# Patient Record
Sex: Female | Born: 1935 | Race: White | Hispanic: No | Marital: Married | Smoking: Never smoker
Health system: Southern US, Community
[De-identification: ages and names within clinical notes are randomized; demographics above are authoritative.]

## PROBLEM LIST (undated history)

## (undated) DIAGNOSIS — I10 Essential (primary) hypertension: Secondary | ICD-10-CM

## (undated) DIAGNOSIS — C801 Malignant (primary) neoplasm, unspecified: Secondary | ICD-10-CM

## (undated) DIAGNOSIS — M1990 Unspecified osteoarthritis, unspecified site: Secondary | ICD-10-CM

## (undated) DIAGNOSIS — C189 Malignant neoplasm of colon, unspecified: Secondary | ICD-10-CM

## (undated) HISTORY — PX: COLOSTOMY: SHX63

## (undated) HISTORY — PX: OTHER SURGICAL HISTORY: SHX169

## (undated) HISTORY — PX: CHOLECYSTECTOMY: SHX55

---

## 2005-01-31 ENCOUNTER — Ambulatory Visit: Payer: Self-pay | Admitting: Internal Medicine

## 2005-03-05 ENCOUNTER — Emergency Department: Payer: Self-pay | Admitting: Emergency Medicine

## 2005-03-18 ENCOUNTER — Ambulatory Visit: Payer: Self-pay | Admitting: Internal Medicine

## 2005-03-22 ENCOUNTER — Ambulatory Visit: Payer: Self-pay | Admitting: Internal Medicine

## 2006-02-17 ENCOUNTER — Ambulatory Visit: Payer: Self-pay | Admitting: Unknown Physician Specialty

## 2006-03-05 ENCOUNTER — Ambulatory Visit: Payer: Self-pay | Admitting: Internal Medicine

## 2007-04-08 ENCOUNTER — Ambulatory Visit: Payer: Self-pay | Admitting: Internal Medicine

## 2007-05-09 ENCOUNTER — Other Ambulatory Visit: Payer: Self-pay

## 2007-05-10 ENCOUNTER — Inpatient Hospital Stay: Payer: Self-pay | Admitting: Internal Medicine

## 2008-07-05 ENCOUNTER — Ambulatory Visit: Payer: Self-pay | Admitting: Internal Medicine

## 2009-05-10 ENCOUNTER — Ambulatory Visit: Payer: Self-pay | Admitting: Unknown Physician Specialty

## 2009-07-06 ENCOUNTER — Ambulatory Visit: Payer: Self-pay | Admitting: Internal Medicine

## 2010-08-17 ENCOUNTER — Ambulatory Visit: Payer: Self-pay | Admitting: Ophthalmology

## 2010-08-28 ENCOUNTER — Ambulatory Visit: Payer: Self-pay | Admitting: Ophthalmology

## 2010-08-31 ENCOUNTER — Ambulatory Visit: Payer: Self-pay | Admitting: Cardiovascular Disease

## 2010-09-12 ENCOUNTER — Ambulatory Visit: Payer: Self-pay | Admitting: Internal Medicine

## 2011-05-19 ENCOUNTER — Emergency Department: Payer: Self-pay | Admitting: Emergency Medicine

## 2011-05-29 ENCOUNTER — Ambulatory Visit: Payer: Self-pay

## 2011-11-06 ENCOUNTER — Ambulatory Visit: Payer: Self-pay | Admitting: Internal Medicine

## 2015-09-11 ENCOUNTER — Other Ambulatory Visit: Payer: Self-pay | Admitting: Internal Medicine

## 2015-09-11 DIAGNOSIS — M5116 Intervertebral disc disorders with radiculopathy, lumbar region: Secondary | ICD-10-CM

## 2015-09-18 ENCOUNTER — Ambulatory Visit
Admission: RE | Admit: 2015-09-18 | Discharge: 2015-09-18 | Disposition: A | Discharge status: Home / Self Care | Payer: Medicare Other | Source: Ambulatory Visit | Attending: Internal Medicine | Admitting: Internal Medicine

## 2015-09-18 DIAGNOSIS — M4806 Spinal stenosis, lumbar region: Secondary | ICD-10-CM | POA: Diagnosis not present

## 2015-09-18 DIAGNOSIS — M5136 Other intervertebral disc degeneration, lumbar region: Secondary | ICD-10-CM | POA: Insufficient documentation

## 2015-09-18 DIAGNOSIS — M5116 Intervertebral disc disorders with radiculopathy, lumbar region: Secondary | ICD-10-CM | POA: Diagnosis present

## 2015-10-21 ENCOUNTER — Encounter: Payer: Self-pay | Admitting: Emergency Medicine

## 2015-10-21 ENCOUNTER — Observation Stay
Admission: EM | Admit: 2015-10-21 | Discharge: 2015-10-22 | Disposition: A | Discharge status: Home / Self Care | Payer: Medicare Other | Attending: Specialist | Admitting: Specialist

## 2015-10-21 DIAGNOSIS — Z7982 Long term (current) use of aspirin: Secondary | ICD-10-CM | POA: Insufficient documentation

## 2015-10-21 DIAGNOSIS — Z79899 Other long term (current) drug therapy: Secondary | ICD-10-CM | POA: Diagnosis not present

## 2015-10-21 DIAGNOSIS — R531 Weakness: Secondary | ICD-10-CM

## 2015-10-21 DIAGNOSIS — E876 Hypokalemia: Principal | ICD-10-CM | POA: Diagnosis present

## 2015-10-21 DIAGNOSIS — K219 Gastro-esophageal reflux disease without esophagitis: Secondary | ICD-10-CM | POA: Insufficient documentation

## 2015-10-21 DIAGNOSIS — Z933 Colostomy status: Secondary | ICD-10-CM | POA: Diagnosis not present

## 2015-10-21 DIAGNOSIS — Z85038 Personal history of other malignant neoplasm of large intestine: Secondary | ICD-10-CM | POA: Insufficient documentation

## 2015-10-21 DIAGNOSIS — I10 Essential (primary) hypertension: Secondary | ICD-10-CM | POA: Diagnosis present

## 2015-10-21 DIAGNOSIS — R197 Diarrhea, unspecified: Secondary | ICD-10-CM | POA: Insufficient documentation

## 2015-10-21 DIAGNOSIS — Z791 Long term (current) use of non-steroidal anti-inflammatories (NSAID): Secondary | ICD-10-CM | POA: Diagnosis not present

## 2015-10-21 HISTORY — DX: Hypercalcemia: E83.52

## 2015-10-21 HISTORY — DX: Essential (primary) hypertension: I10

## 2015-10-21 HISTORY — DX: Malignant neoplasm of colon, unspecified: C18.9

## 2015-10-21 HISTORY — DX: Malignant (primary) neoplasm, unspecified: C80.1

## 2015-10-21 LAB — BASIC METABOLIC PANEL
Anion gap: 9 (ref 5–15)
BUN: 11 mg/dL (ref 6–20)
CALCIUM: 8.4 mg/dL — AB (ref 8.9–10.3)
CO2: 25 mmol/L (ref 22–32)
CREATININE: 0.94 mg/dL (ref 0.44–1.00)
Chloride: 104 mmol/L (ref 101–111)
GFR calc Af Amer: >60 mL/min (ref >60)
GFR calc non Af Amer: 55 mL/min — ABNORMAL LOW (ref >60)
Glucose, Bld: 113 mg/dL — ABNORMAL HIGH (ref 65–99)
Potassium: 2.6 mmol/L — CL (ref 3.5–5.1)
SODIUM: 138 mmol/L (ref 135–145)

## 2015-10-21 LAB — CBC
HEMATOCRIT: 38.4 % (ref 35.0–47.0)
HEMOGLOBIN: 12.9 g/dL (ref 12.0–16.0)
MCH: 29.2 pg (ref 26.0–34.0)
MCHC: 33.5 g/dL (ref 32.0–36.0)
MCV: 87.1 fL (ref 80.0–100.0)
Platelets: 174 10*3/uL (ref 150–440)
RBC: 4.41 MIL/uL (ref 3.80–5.20)
RDW: 13.3 % (ref 11.5–14.5)
WBC: 4.9 10*3/uL (ref 3.6–11.0)

## 2015-10-21 LAB — URINALYSIS COMPLETE WITH MICROSCOPIC (ARMC ONLY)
BACTERIA UA: NONE SEEN
Bilirubin Urine: NEGATIVE
GLUCOSE, UA: NEGATIVE mg/dL
Ketones, ur: NEGATIVE mg/dL
NITRITE: NEGATIVE
PH: 6 (ref 5.0–8.0)
PROTEIN: 100 mg/dL — AB
SPECIFIC GRAVITY, URINE: 1.005 (ref 1.005–1.030)
SQUAMOUS EPITHELIAL / LPF: NONE SEEN

## 2015-10-21 LAB — MAGNESIUM
MAGNESIUM: 1.5 mg/dL — AB (ref 1.7–2.4)
MAGNESIUM: 1.7 mg/dL (ref 1.7–2.4)

## 2015-10-21 LAB — POTASSIUM: POTASSIUM: 3.7 mmol/L (ref 3.5–5.1)

## 2015-10-21 LAB — TROPONIN I: TROPONIN I: 0.03 ng/mL (ref <0.031)

## 2015-10-21 MED ORDER — LISINOPRIL 20 MG PO TABS
20.0000 mg | ORAL_TABLET | Freq: Every day | ORAL | Status: DC
  Administered 2015-10-22: 20 mg via ORAL
  Filled 2015-10-21: qty 1

## 2015-10-21 MED ORDER — LISINOPRIL 20 MG PO TABS
10.0000 mg | ORAL_TABLET | Freq: Once | ORAL | Status: AC
  Administered 2015-10-21: 10 mg via ORAL
  Filled 2015-10-21: qty 1

## 2015-10-21 MED ORDER — SODIUM CHLORIDE 0.9 % IV BOLUS (SEPSIS)
1000.0000 mL | Freq: Once | INTRAVENOUS | Status: AC
  Administered 2015-10-21: 1000 mL via INTRAVENOUS

## 2015-10-21 MED ORDER — POTASSIUM CHLORIDE 10 MEQ/100ML IV SOLN
10.0000 meq | INTRAVENOUS | Status: AC
  Administered 2015-10-21: 14:00:00 10 meq via INTRAVENOUS
  Filled 2015-10-21: qty 100

## 2015-10-21 MED ORDER — POTASSIUM CHLORIDE CRYS ER 20 MEQ PO TBCR
40.0000 meq | EXTENDED_RELEASE_TABLET | Freq: Four times a day (QID) | ORAL | Status: AC
  Administered 2015-10-21 (×2): 40 meq via ORAL
  Filled 2015-10-21 (×2): qty 2

## 2015-10-21 MED ORDER — SODIUM CHLORIDE 0.9 % IJ SOLN
3.0000 mL | Freq: Two times a day (BID) | INTRAMUSCULAR | Status: DC
  Administered 2015-10-21: 21:00:00 3 mL via INTRAVENOUS

## 2015-10-21 MED ORDER — SODIUM CHLORIDE 0.9 % IJ SOLN
3.0000 mL | Freq: Two times a day (BID) | INTRAMUSCULAR | Status: DC
  Administered 2015-10-22: 3 mL via INTRAVENOUS

## 2015-10-21 MED ORDER — MAGNESIUM SULFATE IN D5W 10-5 MG/ML-% IV SOLN
1.0000 g | Freq: Once | INTRAVENOUS | Status: AC
  Administered 2015-10-21: 1 g via INTRAVENOUS
  Filled 2015-10-21 (×2): qty 100

## 2015-10-21 MED ORDER — POTASSIUM CHLORIDE CRYS ER 20 MEQ PO TBCR
40.0000 meq | EXTENDED_RELEASE_TABLET | Freq: Two times a day (BID) | ORAL | Status: AC
  Administered 2015-10-21 (×2): 40 meq via ORAL
  Filled 2015-10-21 (×3): qty 2

## 2015-10-21 MED ORDER — FUROSEMIDE 20 MG PO TABS
20.0000 mg | ORAL_TABLET | Freq: Every day | ORAL | Status: DC
  Administered 2015-10-21 – 2015-10-22 (×2): 20 mg via ORAL
  Filled 2015-10-21 (×2): qty 1

## 2015-10-21 MED ORDER — SODIUM CHLORIDE 0.9 % IV SOLN: 250.0000 mL | INTRAVENOUS | Status: DC | PRN

## 2015-10-21 MED ORDER — CARVEDILOL 25 MG PO TABS
25.0000 mg | ORAL_TABLET | Freq: Two times a day (BID) | ORAL | Status: DC
Start: 2015-10-21 — End: 2015-10-22
  Administered 2015-10-21 – 2015-10-22 (×2): 25 mg via ORAL
  Filled 2015-10-21 (×2): qty 1

## 2015-10-21 MED ORDER — POTASSIUM CHLORIDE 10 MEQ/100ML IV SOLN
10.0000 meq | INTRAVENOUS | Status: DC
  Administered 2015-10-21: 10 meq via INTRAVENOUS
  Filled 2015-10-21 (×2): qty 100

## 2015-10-21 MED ORDER — HYDRALAZINE HCL 20 MG/ML IJ SOLN
10.0000 mg | Freq: Four times a day (QID) | INTRAMUSCULAR | Status: DC | PRN
  Administered 2015-10-22: 07:00:00 10 mg via INTRAVENOUS
  Filled 2015-10-21: qty 1

## 2015-10-21 MED ORDER — PANTOPRAZOLE SODIUM 40 MG PO TBEC
40.0000 mg | DELAYED_RELEASE_TABLET | ORAL | Status: DC
  Administered 2015-10-22: 40 mg via ORAL
  Filled 2015-10-21: qty 1

## 2015-10-21 MED ORDER — ACETAMINOPHEN 650 MG RE SUPP: 650.0000 mg | Freq: Four times a day (QID) | RECTAL | Status: DC | PRN

## 2015-10-21 MED ORDER — VITAMIN B-12 1000 MCG PO TABS
1000.0000 ug | ORAL_TABLET | Freq: Every day | ORAL | Status: DC
  Administered 2015-10-22: 07:00:00 1000 ug via ORAL
  Filled 2015-10-21 (×2): qty 1

## 2015-10-21 MED ORDER — ENOXAPARIN SODIUM 40 MG/0.4ML ~~LOC~~ SOLN
40.0000 mg | SUBCUTANEOUS | Status: DC
  Administered 2015-10-21 – 2015-10-22 (×2): 40 mg via SUBCUTANEOUS
  Filled 2015-10-21 (×2): qty 0.4

## 2015-10-21 MED ORDER — ACETAMINOPHEN 325 MG PO TABS: 650.0000 mg | ORAL_TABLET | Freq: Four times a day (QID) | ORAL | Status: DC | PRN

## 2015-10-21 MED ORDER — ONDANSETRON HCL 4 MG PO TABS: 4.0000 mg | ORAL_TABLET | Freq: Four times a day (QID) | ORAL | Status: DC | PRN

## 2015-10-21 MED ORDER — ONDANSETRON HCL 4 MG/2ML IJ SOLN: 4.0000 mg | Freq: Four times a day (QID) | INTRAMUSCULAR | Status: DC | PRN

## 2015-10-21 MED ORDER — AMLODIPINE BESYLATE 10 MG PO TABS: 10.0000 mg | ORAL_TABLET | Freq: Every day | ORAL | Status: DC

## 2015-10-21 MED ORDER — TRAMADOL HCL 50 MG PO TABS: 50.0000 mg | ORAL_TABLET | Freq: Four times a day (QID) | ORAL | Status: DC | PRN

## 2015-10-21 MED ORDER — ASPIRIN EC 81 MG PO TBEC
81.0000 mg | DELAYED_RELEASE_TABLET | Freq: Every day | ORAL | Status: DC
  Administered 2015-10-22: 07:00:00 81 mg via ORAL
  Filled 2015-10-21: qty 1

## 2015-10-21 MED ORDER — AMLODIPINE BESYLATE 10 MG PO TABS
10.0000 mg | ORAL_TABLET | Freq: Every day | ORAL | Status: DC
  Administered 2015-10-22: 07:00:00 10 mg via ORAL
  Filled 2015-10-21: qty 1

## 2015-10-21 MED ORDER — SODIUM CHLORIDE 0.9 % IJ SOLN: 3.0000 mL | INTRAMUSCULAR | Status: DC | PRN

## 2015-10-21 MED ORDER — ALBUTEROL SULFATE (2.5 MG/3ML) 0.083% IN NEBU: 2.5000 mg | INHALATION_SOLUTION | RESPIRATORY_TRACT | Status: DC | PRN

## 2015-10-21 NOTE — H&P (Addendum)
[name]: [name]    MR#:  [id number]  DATE OF BIRTH:  11-07-1930  DATE OF ADMISSION:  10/21/2015  PRIMARY CARE PHYSICIAN: Rusty Aus., MD   REQUESTING/REFERRING PHYSICIAN: Delman Kitten, MD  CHIEF COMPLAINT:   Chief Complaint  Patient presents with  . Diarrhea   generalized weakness for 2 days  HISTORY OF PRESENT ILLNESS:  [name]  is a 81 y.o. female with a known history of hypertension, hypercholesterolemia and colon cancer. Patient was diagnosed with UTI 5 days ago and was given Keflex. She developed diarrhea after taking Keflex so she stopped Keflex. Diarrhea resolved. But that she try Keflex again 2 days ago, she got diarrhea again. So she stopped Keflex and diarrhea resolved. She denies any fever or chills, no dysuria or hematuria but has urinary frequency due to Lasix. She took her hypertension medication except the Lasix this morning. Her systolic blood pressure is more than 200 in the ED. She only complains of generalized weakness, denies any headache or dizziness or chest pain or palpitation. Her potassium is low at 2.6 in the ED.  PAST MEDICAL HISTORY:   Past Medical History  Diagnosis Date  . Hypertension   . Hypercalcemia   . Malignant neoplasm (HCC)     of descending colon  . Colon cancer ([place])     PAST SURGICAL HISTORY:   Past Surgical History  Procedure Laterality Date  . Colostomy      SOCIAL HISTORY:   Social History  Substance Use Topics  . Smoking status: Never Smoker   . Smokeless tobacco: Not on file  . Alcohol Use: No    FAMILY HISTORY:   Family History  Problem Relation Age of Onset  . CAD Brother     DRUG ALLERGIES:  No Known Allergies  REVIEW OF SYSTEMS:  CONSTITUTIONAL: No fever, generalized weakness.  EYES: No blurred or double vision.  EARS, NOSE, AND THROAT: No tinnitus or ear pain.  RESPIRATORY: No cough, shortness of breath, wheezing or  hemoptysis.  CARDIOVASCULAR: No chest pain, orthopnea, edema.  GASTROINTESTINAL: No nausea, vomiting,  or abdominal pain, diarrhea twice, resolved.  GENITOURINARY: No dysuria, hematuria. But has urine frequency. ENDOCRINE: No polyuria, nocturia,  HEMATOLOGY: No anemia, easy bruising or bleeding SKIN: No rash or lesion. MUSCULOSKELETAL: No joint pain or arthritis.   NEUROLOGIC: No tingling, numbness, weakness.  PSYCHIATRY: No anxiety or depression.   MEDICATIONS AT HOME:   Prior to Admission medications   Medication Sig Start Date End Date Taking? Authorizing Provider  amLODipine (NORVASC) 10 MG tablet Take 10 mg by mouth daily. 09/11/15  Yes Historical Provider, MD  aspirin EC 81 MG tablet Take 81 mg by mouth daily.   Yes Historical Provider, MD  carvedilol (COREG) 25 MG tablet Take 25 mg by mouth 2 (two) times daily. 09/11/15  Yes Historical Provider, MD  cephALEXin (KEFLEX) 250 MG capsule Take 250 mg by mouth 4 (four) times daily. Take for 5 days. 10/19/15 10/23/15 Yes Historical Provider, MD  Cyanocobalamin (RA VITAMIN B-12 TR) 1000 MCG TBCR Take 1,000 mcg by mouth daily.   Yes Historical Provider, MD  furosemide (LASIX) 20 MG tablet Take 20 mg by mouth daily. 09/11/15  Yes Historical Provider, MD  ibuprofen (ADVIL,MOTRIN) 200 MG tablet Take 200 mg by mouth every 6 (six) hours as needed. For pain.   Yes Historical Provider, MD  lisinopril (PRINIVIL,ZESTRIL) 20 MG tablet Take 20 mg by mouth daily.  10/18/15  Yes Historical Provider, MD  pantoprazole (PROTONIX) 40 MG tablet Take 40 mg by mouth every morning. 09/11/15  Yes Historical Provider, MD  traMADol (ULTRAM) 50 MG tablet Take 50 mg by mouth 3 (three) times daily as needed. For pain. 09/11/15  Yes Historical Provider, MD      VITAL SIGNS:  Blood pressure 201/79, pulse 64, temperature 98.2 F (36.8 C), temperature source Oral, resp. rate 16, height 5\' 5"  (1.651 m), weight 94.348 kg (208 lb), SpO2 99 %.  PHYSICAL EXAMINATION:   GENERAL:  81 y.o.-year-old patient lying in the bed with no acute distress.  EYES: Pupils equal, round, reactive to light and accommodation. No scleral icterus. Extraocular muscles intact.  HEENT: Head atraumatic, normocephalic. Oropharynx and nasopharynx clear.  NECK:  Supple, no jugular venous distention. No thyroid enlargement, no tenderness.  LUNGS: Normal breath sounds bilaterally, no wheezing, rales,rhonchi or crepitation. No use of accessory muscles of respiration.  CARDIOVASCULAR: S1, S2 normal. No murmurs, rubs, or gallops.  ABDOMEN: Soft, nontender, nondistended. Bowel sounds present. No organomegaly or mass.  EXTREMITIES: Bilateral ankle edema, no cyanosis, or clubbing.  NEUROLOGIC: Cranial nerves II through XII are intact. Muscle strength 3-4/5 in all extremities. Sensation intact. Gait not checked.  PSYCHIATRIC: The patient is alert and oriented x 3.  SKIN: No obvious rash, lesion, or ulcer.   LABORATORY PANEL:   CBC  Recent Labs Lab 10/21/15 0844  WBC 4.9  HGB 12.9  HCT 38.4  PLT 174   ------------------------------------------------------------------------------------------------------------------  Chemistries   Recent Labs Lab 10/21/15 0844  NA 138  K 2.6*  CL 104  CO2 25  GLUCOSE 113*  BUN 11  CREATININE 0.94  CALCIUM 8.4*   ------------------------------------------------------------------------------------------------------------------  Cardiac Enzymes  Recent Labs Lab 10/21/15 0844  TROPONINI 0.03   ------------------------------------------------------------------------------------------------------------------  RADIOLOGY:  No results found.  EKG:   Orders placed or performed during the hospital encounter of 10/21/15  . ED EKG  . ED EKG  . EKG 12-Lead  . EKG 12-Lead    IMPRESSION AND PLAN:   Hypokalemia Hypertension malignancy Generalized weakness Recent UTI History of colon cancer  The patient will be admitted to medical  floor with telemetry monitor. Give potassium supplement both by mouth and IV, follow-up BMP and magnesium level. I will give hydralazine IV when necessary and continue patient hypertension medication including Coreg, lisinopril and Lasix. I will check urinalysis to rule out urinary infection.  PT consult for weakness.  All the records are reviewed and case discussed with ED provider. Management plans discussed with the patient, sister-in-law and they are in agreement.  CODE STATUS:Full code.  TOTAL TIME TAKING CARE OF THIS PATIENT:  minutes.    Demetrios Loll M.D on 10/21/2015 at 11:17 AM  Between 7am to 6pm - Pager - [phone]  After 6pm go to [email or web address] - password [email or web address]  [facility]  Office  [phone]  CC: Primary care physician; Rusty Aus., MD

## 2015-10-21 NOTE — Progress Notes (Signed)
MEDICATION RELATED CONSULT NOTE - INITIAL   Pharmacy Consult for KCl Replacement  Indication: Low potassium  No Known Allergies  Patient Measurements: Height: 5\' 5"  (165.1 cm) Weight: 182 lb (82.555 kg) IBW/kg (Calculated) : 57   Vital Signs: Temp: 98.4 F (36.9 C) (11/19 2047) Temp Source: Oral (11/19 2047) BP: 178/56 mmHg (11/19 2047) Pulse Rate: 86 (11/19 2047) Intake/Output from previous day:   Intake/Output from this shift:    Labs:  Recent Labs  10/21/15 0844 10/21/15 2047  WBC 4.9  --   HGB 12.9  --   HCT 38.4  --   PLT 174  --   CREATININE 0.94  --   MG 1.5* 1.7   Estimated Creatinine Clearance: 48.1 mL/min (by C-G formula based on Cr of 0.94).   Microbiology: No results found for this or any previous visit (from the past 720 hour(s)).  Medical History: Past Medical History  Diagnosis Date  . Hypertension   . Hypercalcemia   . Malignant neoplasm (HCC)     of descending colon  . Colon cancer [facility])      Assessment: 81 yo female with K of 2.6 in the ED. Per notes pt with diarrhea after starting abx for UTI. Patient received KCl 10 mEq IV x2 runs and mag sulfate 1 g IV x1 and KCl 5mEq PO q6h x 2 doses.  K and Mag are now in range.  Goal of Therapy:  K 3.5-5.0 Mg 1.7-2.4  Plan:  Will follow up on am labs  Pharmacy will continue to follow   Paulina Fusi, PharmD, BCPS 10/21/2015 9:44 PM

## 2015-10-21 NOTE — ED Notes (Signed)
Pt here to ER today for high blood pressure. States that it has been "over 200" since 10/10/15. Pt has hx of hypertension, no change in medications. Pt checks BP at home. Pt also reports she was dx with UTI 10/18/15 and has had diarrhea since beginning antibiotics. Pt denies pain. Ambulatory.

## 2015-10-21 NOTE — Progress Notes (Signed)
PT Cancellation Note  Patient Details Name: [name] MRN: [id number] DOB: October 19, 1930   Cancelled Treatment:    Reason Eval/Treat Not Completed: Medical issues which prohibited therapy. Patient's potassium levels 2.6 at time of chart review. 3.2 is cut off for physical mobility assessment. Will check back at later time when blood lab values have normalized.   Dorice Lamas, PT, DPT 10/21/2015, 2:04 PM

## 2015-10-21 NOTE — Plan of Care (Signed)
Problem: Safety: Goal: Ability to remain free from injury will improve Outcome: Progressing Patient new admission today from ED, being treated at home for UTI with keflex and started having diarrhea, blood pressure very high and potassium low, potassium replaced today will redraw this evening.  Patient under observation right now may discharge tomorrow.  No diarrhea since admission.

## 2015-10-21 NOTE — ED Notes (Signed)
MD at bedside. 

## 2015-10-21 NOTE — ED Notes (Signed)
Pt to ed with c/o diarrhea after taking abx for UTI.  Pt states she was seen in ED at [facility] for UTI symptoms and started on cephelexin.  Reports it has caused upset stomach and diarrhea.

## 2015-10-21 NOTE — Progress Notes (Signed)
MEDICATION RELATED CONSULT NOTE - INITIAL   Pharmacy Consult for KCl Replacement  Indication: Low potassium  No Known Allergies  Patient Measurements: Height: 5\' 5"  (165.1 cm) Weight: 208 lb (94.348 kg) IBW/kg (Calculated) : 57   Vital Signs: Temp: 98.2 F (36.8 C) (11/19 0816) Temp Source: Oral (11/19 0816) BP: 201/79 mmHg (11/19 0830) Pulse Rate: 64 (11/19 0830) Intake/Output from previous day:   Intake/Output from this shift:    Labs:  Recent Labs  10/21/15 0844  WBC 4.9  HGB 12.9  HCT 38.4  PLT 174  CREATININE 0.94   Estimated Creatinine Clearance: 51.5 mL/min (by C-G formula based on Cr of 0.94).   Microbiology: No results found for this or any previous visit (from the past 720 hour(s)).  Medical History: Past Medical History  Diagnosis Date  . Hypertension   . Hypercalcemia   . Malignant neoplasm (HCC)     of descending colon     Assessment: 81 yo female with K of 2.6 in the ED. Per notes pt with diarrhea after starting abx for UTI.  Current orders for KCl 10 mEq IV x2 runs and mag sulfate 1 g IV x1  Goal of Therapy:  K 3.5-5.0 Mg 1.7-2.4  Plan:  1. Will add KCl 40 mEq PO q6h x2 doses 2. Will recheck K level and add mag level to be checked at  [facility] will continue to follow   Rayna Sexton L 10/21/2015,10:39 AM

## 2015-10-21 NOTE — ED Provider Notes (Signed)
[facility] Emergency Department Provider Note REMINDER - THIS NOTE IS NOT A FINAL MEDICAL RECORD UNTIL IT IS SIGNED. UNTIL THEN, THE CONTENT BELOW MAY REFLECT INFORMATION FROM A DOCUMENTATION TEMPLATE, NOT THE ACTUAL PATIENT VISIT. ____________________________________________  Time seen: Approximately 10:24 AM  I have reviewed the triage vital signs and the nursing notes.   HISTORY  Chief Complaint Diarrhea    HPI [name] is a 81 y.o. female ventricular evaluation of diarrhea and generalized weakness. She reports she was diagnosed the urinary tract infection about 3 days ago down at [facility]. Since that time she did take antibiotics however she takes them she reports that she starts to have loose stools and cramps. She continues to feel weak and feels that she is getting more fatigued.  She was also noting that she didn't having high blood pressure which is been on on for about a week, working to [employer] though she is taking her medications as prescribed regularly. She did have some slight blurring of her vision a few days ago which prompted her to go to [facility], but reports that this is resolved.  No fevers or chills. Denies dysuria. She reports at that time her only significant concern is generalized weakness and high blood pressure.   Past Medical History  Diagnosis Date  . Hypertension   . Hypercalcemia   . Malignant neoplasm (HCC)     of descending colon  . Colon cancer [facility])     Patient Active Problem List   Diagnosis Date Noted  . Hypokalemia 10/21/2015  . Malignant hypertension 10/21/2015  . Weakness 10/21/2015    Past Surgical History  Procedure Laterality Date  . Colostomy      No current outpatient prescriptions on file.  Allergies Review of patient's allergies indicates no known allergies.  Family History  Problem Relation Age of Onset  . CAD Brother     Social History Social History  Substance  Use Topics  . Smoking status: Never Smoker   . Smokeless tobacco: None  . Alcohol Use: No    Review of Systems Constitutional: No fever/chills Eyes: No visual changes. Denies some blurring her vision a few days ago but this is resolved. ENT: No sore throat. Cardiovascular: Denies chest pain. Respiratory: Denies shortness of breath. Gastrointestinal: No abdominal pain.  No nausea, no vomiting.  No diarrhea.  No constipation. Genitourinary: Negative for dysuria. Diagnosed ear infection (cephalexin. Musculoskeletal: Negative for back pain. Skin: Negative for rash. Neurological: Negative for headaches, focal weakness or numbness.  10-point ROS otherwise negative.  ____________________________________________   PHYSICAL EXAM:  VITAL SIGNS: ED Triage Vitals  Enc Vitals Group     BP 10/21/15 0816 182/154 mmHg     Pulse Rate 10/21/15 0816 65     Resp 10/21/15 0816 18     Temp 10/21/15 0816 98.2 F (36.8 C)     Temp Source 10/21/15 0816 Oral     SpO2 10/21/15 0816 99 %     Weight 10/21/15 0816 208 lb (94.348 kg)     Height 10/21/15 0816 5\' 5"  (1.651 m)     Head Cir --      Peak Flow --      Pain Score 10/21/15 0812 0     Pain Loc --      Pain Edu? --      Excl. in [place]? --    Constitutional: Alert and oriented. Well appearing and in no acute distress. Eyes: Conjunctivae are normal.  PERRL. EOMI. Head: Atraumatic. Nose: No congestion/rhinnorhea. Mouth/Throat: Mucous membranes are slightly dry.  Oropharynx non-erythematous. Neck: No stridor.   Cardiovascular: Normal rate, regular rhythm. Grossly normal heart sounds.  Good peripheral circulation. Respiratory: Normal respiratory effort.  No retractions. Lungs CTAB. Gastrointestinal: Soft and nontender. No distention. No abdominal bruits. No CVA tenderness. Musculoskeletal: No lower extremity tenderness nor edema.  No joint effusions. Neurologic:  Normal speech and language. No gross focal neurologic deficits are appreciated.  Skin:  Skin is warm, dry and intact. No rash noted. Psychiatric: Mood and affect are normal. Speech and behavior are normal.  ____________________________________________   LABS (all labs ordered are listed, but only abnormal results are displayed)  Labs Reviewed  BASIC METABOLIC PANEL - Abnormal; Notable for the following:    Potassium 2.6 (*)    Glucose, Bld 113 (*)    Calcium 8.4 (*)    GFR calc non Af Amer 55 (*)    All other components within normal limits  URINALYSIS COMPLETEWITH MICROSCOPIC ([facility] ONLY) - Abnormal; Notable for the following:    Color, Urine YELLOW (*)    APPearance CLEAR (*)    Hgb urine dipstick 1+ (*)    Protein, ur 100 (*)    Leukocytes, UA 1+ (*)    All other components within normal limits  MAGNESIUM - Abnormal; Notable for the following:    Magnesium 1.5 (*)    All other components within normal limits  URINE CULTURE  TROPONIN I  CBC  POTASSIUM  MAGNESIUM   ____________________________________________  EKG  EKG reviewed and interpreted me at 8:35 AM ED ECG REPORT I, Carlito Bogert, the attending physician, personally viewed and interpreted this ECG.  Date: 10/21/2015 EKG Time: 1835 Rate: 65 Rhythm: normal sinus rhythm QRS Axis: normal Intervals: normal ST/T Wave abnormalities: normal Conduction Disutrbances: Anterior and inferior Q waves noted no evidence of ST elevation Narrative Interpretation: No acute ischemic changes, Q waves noted ant=- inferior distribution  ____________________________________________  RADIOLOGY   ____________________________________________   PROCEDURES  Procedure(s) performed: None  Critical Care performed: No  ____________________________________________   INITIAL IMPRESSION / ASSESSMENT AND PLAN / ED COURSE  Pertinent labs & imaging results that were available during my care of the patient were reviewed by me and considered in my medical decision making (see chart for details).  Neurologic  generalized weakness, hypertension and recent urinary tract infection. 2 intolerantofhermedicationandreportsdiarrhea. She is in no distress, however her labs indicate significant hypokalemia with associated generalized weakness discussed with the patient and we'll admit her to the hospital for repletion of her electrolytes regular she seems to be quite symptomatic. In addition we'll send culture and to facial study to further evaluate for causes of diarrhea, though it appears to be likely related to taking her cephalexin. No signs or symptoms of bowel obstruction or other acute intra-abdominal process. We will send a repeat urinalysis to evaluate to see if she is cleared her urine. No acute cardiopulmonary complaints.  Assessment the hospitalist will follow-up on urinalysis which is pending. ____________________________________________   FINAL CLINICAL IMPRESSION(S) / ED DIAGNOSES  Final diagnoses:  Hypokalemia  Weakness generalized  Essential hypertension      Delman Kitten, MD 10/21/15 [phone]

## 2015-10-21 NOTE — Progress Notes (Signed)
Patient admitted from ED - family at bedside. Madlyn Frankel, RN

## 2015-10-21 NOTE — ED Notes (Signed)
Critical result to Dr Jacqualine Code potassium 2.6

## 2015-10-21 NOTE — ED Notes (Signed)
Awaiting IV infusions from pharmacy

## 2015-10-22 LAB — BASIC METABOLIC PANEL
ANION GAP: 5 (ref 5–15)
BUN: 10 mg/dL (ref 6–20)
CALCIUM: 8.1 mg/dL — AB (ref 8.9–10.3)
CO2: 25 mmol/L (ref 22–32)
Chloride: 108 mmol/L (ref 101–111)
Creatinine, Ser: 0.92 mg/dL (ref 0.44–1.00)
GFR, EST NON AFRICAN AMERICAN: 56 mL/min — AB (ref >60)
GLUCOSE: 95 mg/dL (ref 65–99)
POTASSIUM: 3.9 mmol/L (ref 3.5–5.1)
Sodium: 138 mmol/L (ref 135–145)

## 2015-10-22 LAB — CBC
HEMATOCRIT: 35.7 % (ref 35.0–47.0)
HEMOGLOBIN: 12 g/dL (ref 12.0–16.0)
MCH: 29.4 pg (ref 26.0–34.0)
MCHC: 33.5 g/dL (ref 32.0–36.0)
MCV: 87.7 fL (ref 80.0–100.0)
Platelets: 168 10*3/uL (ref 150–440)
RBC: 4.07 MIL/uL (ref 3.80–5.20)
RDW: 13.7 % (ref 11.5–14.5)
WBC: 4.3 10*3/uL (ref 3.6–11.0)

## 2015-10-22 MED ORDER — LISINOPRIL 40 MG PO TABS: 40.0000 mg | ORAL_TABLET | Freq: Every day | ORAL | Status: DC

## 2015-10-22 MED ORDER — LISINOPRIL 20 MG PO TABS: 40.0000 mg | ORAL_TABLET | Freq: Every day | ORAL | Status: DC

## 2015-10-22 NOTE — Progress Notes (Signed)
Physical Therapy Evaluation Patient Details Name: [name] MRN: [id number] DOB: 1930/09/15 Today's Date: 10/22/2015   History of Present Illness  Patient is an 81 y.o. female admitted on 19 Nov. for hypokalemia and HTN. Was diagnosed with UTI in past week and prescribed Keflex which caused diarrhea. PMH includes HTN, hypercholesterlemia, and colon CA with colostomy.  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 On evaluation, patient performed mobility independently or modified independently; however, she was limited in ability to ambulate further distances, secondary to fatigue. Patient's needs in the hospital have been met; however, she may benefit from f/u with HHPT upon discharge to improve cardiovascular endurance and function.    Follow Up Recommendations Home health PT    Equipment Recommendations  Other (comment) Sales executive chair)    Recommendations for Other Services       Precautions / Restrictions Precautions Precautions: Fall Restrictions Weight Bearing Restrictions: No      Mobility  Bed Mobility Overal bed mobility: Independent             General bed mobility comments: Patient independent in rolling, bridging, and scooting.  Transfers Overall transfer level: Modified independent Equipment used: Rolling walker (2 wheeled)             General transfer comment: Patient able to perform sit to stand with modified independence.  Ambulation/Gait Ambulation/Gait assistance: Min guard Ambulation Distance (Feet): 70 Feet Assistive device: Rolling walker (2 wheeled) Gait Pattern/deviations: WFL(Within Functional Limits)     General Gait Details: Patient ambulates with no significant deviations. Vitals stayed WNL but demonstrates easy fatigue.  Stairs            Wheelchair Mobility    Modified Rankin (Stroke Patients Only)       Balance  Overall balance assessment: Needs assistance Sitting-balance support: Feet supported Sitting balance-Leahy Scale: Good     Standing balance support: Bilateral upper extremity supported Standing balance-Leahy Scale: Good                               Pertinent Vitals/Pain Pain Assessment: No/denies pain    Home Living Family/patient expects to be discharged to:: Private residence Living Arrangements: Spouse/significant other Available Help at Discharge: Family;Available 24 hours/day;Available PRN/intermittently;Other (Comment) (Husband 24 hrs/day; son lives next door) Type of Home: House Home Access: Level entry     Home Layout: One level Home Equipment: Environmental consultant - 4 wheels      Prior Function Level of Independence: Needs assistance   Gait / Transfers Assistance Needed: Patient modified independent for household ambulation with rollator  ADL's / Homemaking Assistance Needed: Patient performs cooking; husband performs shopping        Hand Dominance        Extremity/Trunk Assessment   Upper Extremity Assessment: RUE deficits/detail RUE Deficits / Details: AROM shoulder flexion ~90 deg. due to past fx         Lower Extremity Assessment: Generalized weakness         Communication   Communication: No difficulties  Cognition Arousal/Alertness: Awake/alert Behavior During Therapy: WFL for tasks assessed/performed Overall Cognitive Status: Within Functional Limits for tasks assessed                      General Comments      Exercises        Assessment/Plan    PT  Assessment Patent does not need any further PT services  PT Diagnosis Generalized weakness   PT Problem List    PT Treatment Interventions     PT Goals (Current goals can be found in the Care Plan section) Acute Rehab PT Goals Patient Stated Goal: "To get better" PT Goal Formulation: With patient Time For Goal Achievement: 11/05/15 Potential to Achieve Goals: Good     Frequency     Barriers to discharge        Co-evaluation               End of Session Equipment Utilized During Treatment: Gait belt Activity Tolerance: Patient tolerated treatment well;Patient limited by fatigue Patient left: in bed;with call bell/phone within reach;with bed alarm set Nurse Communication: Mobility status    Functional Limitation: Mobility: Walking and moving around Mobility: Walking and Moving Around Current Status (A4536): At least 40 percent but less than 60 percent impaired, limited or restricted Mobility: Walking and Moving Around Goal Status [phone]): At least 20 percent but less than 40 percent impaired, limited or restricted    Time: 0845-0900 PT Time Calculation (min) (ACUTE ONLY): 15 min   Charges:   PT Evaluation $Initial PT Evaluation Tier I: 1 Procedure     PT G Codes:   PT G-Codes **NOT FOR INPATIENT CLASS** Functional Limitation: Mobility: Walking and moving around Mobility: Walking and Moving Around Current Status (O1224): At least 40 percent but less than 60 percent impaired, limited or restricted Mobility: Walking and Moving Around Goal Status [phone]): At least 20 percent but less than 40 percent impaired, limited or restricted    Dorice Lamas, PT, DPT 10/22/2015, 9:56 AM

## 2015-10-22 NOTE — Discharge Summary (Signed)
[facility] NAME: Jeff Chessor    MR#:  [id number]  DATE OF BIRTH:  08-May-1930  DATE OF ADMISSION:  10/21/2015 ADMITTING PHYSICIAN: Demetrios Loll, MD  DATE OF DISCHARGE: No discharge date for patient encounter.  PRIMARY CARE PHYSICIAN: Rusty Aus., MD    ADMISSION DIAGNOSIS:  Hypokalemia [E87.6] Weakness generalized [R53.1] Essential hypertension [I10]  DISCHARGE DIAGNOSIS:  Principal Problem:   Hypokalemia Active Problems:   Malignant hypertension   Weakness   SECONDARY DIAGNOSIS:   Past Medical History  Diagnosis Date  . Hypertension   . Hypercalcemia   . Malignant neoplasm (HCC)     of descending colon  . Colon cancer [facility])     HOSPITAL COURSE:   81 year old female with past medical history of hypertension, history of colon cancer, history of hypercalcemia who presents to the hospital due to due to diarrhea and also noted to be hypokalemic.  #1 hypokalemia-this was likely secondary to the diarrhea patient was having prior to admission. -This has since then been supplemented and now resolved. She is no longer having any further diarrhea.  #2 accelerated hypertension-patient has a history of essential hypertension but her blood pressures were somewhat uncontrolled. -She has no evidence of end organ damage or any clinical symptoms presently. -She will continue her Coreg, Norvasc and I have increased the dose of her lisinopril to 40 mg. She will continue follow-up with her primary care physician.  #3 GERD-patient will continue Protonix.  #4 Generalized weakness - seen by PT and they recommend Home Health which will be arranged for him prior to discharge.    DISCHARGE CONDITIONS:   Stable.   CONSULTS OBTAINED:     DRUG ALLERGIES:  No Known Allergies  DISCHARGE MEDICATIONS:   Current Discharge Medication List    CONTINUE these medications which have CHANGED   Details  lisinopril  (PRINIVIL,ZESTRIL) 40 MG tablet Take 1 tablet (40 mg total) by mouth daily. Qty: 60 tablet, Refills: 1      CONTINUE these medications which have NOT CHANGED   Details  amLODipine (NORVASC) 10 MG tablet Take 10 mg by mouth daily. Refills: 0    aspirin EC 81 MG tablet Take 81 mg by mouth daily.    carvedilol (COREG) 25 MG tablet Take 25 mg by mouth 2 (two) times daily. Refills: 0    Cyanocobalamin (RA VITAMIN B-12 TR) 1000 MCG TBCR Take 1,000 mcg by mouth daily.    furosemide (LASIX) 20 MG tablet Take 20 mg by mouth daily. Refills: 0    ibuprofen (ADVIL,MOTRIN) 200 MG tablet Take 200 mg by mouth every 6 (six) hours as needed. For pain.    pantoprazole (PROTONIX) 40 MG tablet Take 40 mg by mouth every morning. Refills: 0    traMADol (ULTRAM) 50 MG tablet Take 50 mg by mouth 3 (three) times daily as needed. For pain. Refills: 0      STOP taking these medications     cephALEXin (KEFLEX) 250 MG capsule          DISCHARGE INSTRUCTIONS:   DIET:  Cardiac diet  DISCHARGE CONDITION:  Stable  ACTIVITY:  Activity as tolerated  OXYGEN:  Home Oxygen: No.   Oxygen Delivery: room air  DISCHARGE LOCATION:  Home with Home health.    If you experience worsening of your admission symptoms, develop shortness of breath, life threatening emergency, suicidal or homicidal thoughts you must seek medical attention immediately by calling 911 or calling your  MD immediately  if symptoms less severe.  You Must read complete instructions/literature along with all the possible adverse reactions/side effects for all the Medicines you take and that have been prescribed to you. Take any new Medicines after you have completely understood and accpet all the possible adverse reactions/side effects.   Please note  You were cared for by a hospitalist during your hospital stay. If you have any questions about your discharge medications or the care you received while you were in the hospital after  you are discharged, you can call the unit and asked to speak with the hospitalist on call if the hospitalist that took care of you is not available. Once you are discharged, your primary care physician will handle any further medical issues. Please note that NO REFILLS for any discharge medications will be authorized once you are discharged, as it is imperative that you return to your primary care physician (or establish a relationship with a primary care physician if you do not have one) for your aftercare needs so that they can reassess your need for medications and monitor your lab values.     Today   Still feels a bit dizzy but improved.  Potassium level normal today and diarrhea resolved.   VITAL SIGNS:  Blood pressure 141/45, pulse 72, temperature 98.7 F (37.1 C), temperature source Oral, resp. rate 24, height 5\' 5"  (1.651 m), weight 82.555 kg (182 lb), SpO2 95 %.  I/O:   Intake/Output Summary (Last 24 hours) at 10/22/15 1404 Last data filed at 10/22/15 0900  Gross per 24 hour  Intake    480 ml  Output    550 ml  Net    -70 ml    PHYSICAL EXAMINATION:   GENERAL:  81 y.o.-year-old patient lying in the bed with no acute distress.  EYES: Pupils equal, round, reactive to light and accommodation. No scleral icterus. Extraocular muscles intact.  HEENT: Head atraumatic, normocephalic. Oropharynx and nasopharynx clear.  NECK:  Supple, no jugular venous distention. No thyroid enlargement, no tenderness.  LUNGS: Normal breath sounds bilaterally, no wheezing, rales,rhonchi. No use of accessory muscles of respiration.  CARDIOVASCULAR: S1, S2 normal. No murmurs, rubs, or gallops.  ABDOMEN: Soft, non-tender, non-distended. Bowel sounds present. No organomegaly or mass.  EXTREMITIES: No pedal edema, cyanosis, or clubbing.  NEUROLOGIC: Cranial nerves II through XII are intact. No focal motor or sensory defecits b/l.  PSYCHIATRIC: The patient is alert and oriented x 3. Good affect.  SKIN: No  obvious rash, lesion, or ulcer.   DATA REVIEW:   CBC  Recent Labs Lab 10/22/15 0408  WBC 4.3  HGB 12.0  HCT 35.7  PLT 168    Chemistries   Recent Labs Lab 10/21/15 2047 10/22/15 0408  NA  --  138  K 3.7 3.9  CL  --  108  CO2  --  25  GLUCOSE  --  95  BUN  --  10  CREATININE  --  0.92  CALCIUM  --  8.1*  MG 1.7  --     Cardiac Enzymes  Recent Labs Lab 10/21/15 0844  TROPONINI 0.03    Microbiology Results  Results for orders placed or performed during the hospital encounter of 10/21/15  Urine culture     Status: None (Preliminary result)   Collection Time: 10/21/15  8:44 AM  Result Value Ref Range Status   Specimen Description URINE, CLEAN CATCH  Final   Special Requests Normal  Final   Culture NO  GROWTH < 24 HOURS  Final   Report Status PENDING  Incomplete    RADIOLOGY:  No results found.    Management plans discussed with the patient, family and they are in agreement.  CODE STATUS:     Code Status Orders        Start     Ordered   10/21/15 1209  Full code   Continuous     10/21/15 1208    Advance Directive Documentation        Most Recent Value   Type of Advance Directive  Healthcare Power of Attorney, Living will   Pre-existing out of facility DNR order (yellow form or pink MOST form)     "MOST" Form in Place?        TOTAL TIME TAKING CARE OF THIS PATIENT: 40 minutes.    Henreitta Leber M.D on 10/22/2015 at 2:04 PM  Between 7am to 6pm - Pager - [phone]  After 6pm go to [email or web address] - password [email or web address]  [facility]  Office  [phone]  CC: Primary care physician; Rusty Aus., MD

## 2015-10-22 NOTE — Progress Notes (Signed)
MEDICATION RELATED CONSULT NOTE - INITIAL   Pharmacy Consult for KCl Replacement  Indication: Low potassium  No Known Allergies  Patient Measurements: Height: 5\' 5"  (165.1 cm) Weight: 182 lb (82.555 kg) IBW/kg (Calculated) : 57   Vital Signs: Temp: 98.7 F (37.1 C) (11/20 0514) Temp Source: Oral (11/20 0514) BP: 182/55 mmHg (11/20 0514) Pulse Rate: 73 (11/20 0514) Intake/Output from previous day: 11/19 0701 - 11/20 0700 In: 240 [P.O.:240] Out: 550 [Urine:550] Intake/Output from this shift:    Labs:  Recent Labs  10/21/15 0844 10/21/15 2047 10/22/15 0408  WBC 4.9  --  4.3  HGB 12.9  --  12.0  HCT 38.4  --  35.7  PLT 174  --  168  CREATININE 0.94  --  0.92  MG 1.5* 1.7  --    Estimated Creatinine Clearance: 49.2 mL/min (by C-G formula based on Cr of 0.92).   Microbiology: No results found for this or any previous visit (from the past 720 hour(s)).  Medical History: Past Medical History  Diagnosis Date  . Hypertension   . Hypercalcemia   . Malignant neoplasm (HCC)     of descending colon  . Colon cancer [facility])      Assessment: 81 yo female with K of 2.6 in the ED. Per notes pt with diarrhea after starting abx for UTI. Patient received KCl 10 mEq IV x2 runs and mag sulfate 1 g IV x1 and KCl 81mEq PO q6h x 2 doses.  K and Mag are now in range.  Goal of Therapy:  K 3.5-5.0 Mg 1.7-2.4  Plan:  Will follow up on am labs  Pharmacy will continue to follow   Paulina Fusi, PharmD, BCPS 10/22/2015 6:26 AM   11/20 AM electrolytes WNL. Recheck BMP and Mg tomorrow AM.  Sim Boast, PharmD, BCPS  10/22/2015

## 2015-10-22 NOTE — Care Management Note (Signed)
Case Management Note  Patient Details  Name: [name] MRN: [id number] Date of Birth: 1930-11-14  Subjective/Objective: Discussed Discharge planning with [name] who chose [facility] to be her home health PT provider. A referral was called to the on-call nurse for [facility] and the discharge orders were faxed to [facility]                    Action/Plan:   Expected Discharge Date:                  Expected Discharge Plan:     In-House Referral:     Discharge planning Services     Post Acute Care Choice:    Choice offered to:     DME Arranged:    DME Agency:     HH Arranged:    [place] Agency:     Status of Service:     Medicare Important Message Given:    Date Medicare IM Given:    Medicare IM give by:    Date Additional Medicare IM Given:    Additional Medicare Important Message give by:     If discussed at [place] of Stay Meetings, dates discussed:    Additional Comments:  Julie-Anne Torain A, RN 10/22/2015, 2:54 PM

## 2015-10-22 NOTE — Progress Notes (Signed)
MD order received in [facility] to discharge pt home today; verbally reviewed AVS with pt including medications/gave Rx for Lisinopril to pt; diet; activity level and follow up appointment with Dr Elta Guadeloupe Miller/pt already has an appointment with Dr Sabra Heck on 10/23/15 at 10:30am; no questions voiced at this time; pt discharged via wheelchair by nursing to the visitor's entrance

## 2015-10-22 NOTE — Plan of Care (Signed)
Problem: Fluid Volume: Goal: Ability to maintain a balanced intake and output will improve Outcome: Completed/Met Date Met:  10/22/15 All general education completed.  PT consult done with recommendations for [facility] with PT with Jeani Hawking, case manager, in with referral to be done. No injuries or safety issues. Eating well. Hydralazine IV given x 1 effective with BP now in normal limits with lisinopril 40 mg po to start tomorrow. Oral and written AVS done with pt and husband with prescription for lisinopril 40 mg given to pt. Pt transported in transport chair to private vehicle accompanied by her husband, other family member and pastor. Discharge home with [facility] PT.

## 2015-10-23 LAB — URINE CULTURE
Culture: NO GROWTH
SPECIAL REQUESTS: NORMAL

## 2016-01-29 ENCOUNTER — Inpatient Hospital Stay
Admission: EM | Admit: 2016-01-29 | Discharge: 2016-01-31 | DRG: 389 | Disposition: A | Discharge status: Home / Self Care | Payer: Medicare Other | Attending: Surgery | Admitting: Surgery

## 2016-01-29 ENCOUNTER — Emergency Department: Payer: Medicare Other

## 2016-01-29 ENCOUNTER — Encounter: Payer: Self-pay | Admitting: Emergency Medicine

## 2016-01-29 DIAGNOSIS — K435 Parastomal hernia without obstruction or  gangrene: Secondary | ICD-10-CM | POA: Diagnosis present

## 2016-01-29 DIAGNOSIS — Z803 Family history of malignant neoplasm of breast: Secondary | ICD-10-CM | POA: Diagnosis not present

## 2016-01-29 DIAGNOSIS — Z8249 Family history of ischemic heart disease and other diseases of the circulatory system: Secondary | ICD-10-CM

## 2016-01-29 DIAGNOSIS — K219 Gastro-esophageal reflux disease without esophagitis: Secondary | ICD-10-CM | POA: Diagnosis present

## 2016-01-29 DIAGNOSIS — I10 Essential (primary) hypertension: Secondary | ICD-10-CM | POA: Diagnosis present

## 2016-01-29 DIAGNOSIS — K56609 Unspecified intestinal obstruction, unspecified as to partial versus complete obstruction: Secondary | ICD-10-CM | POA: Diagnosis present

## 2016-01-29 DIAGNOSIS — K5669 Other intestinal obstruction: Secondary | ICD-10-CM | POA: Diagnosis not present

## 2016-01-29 DIAGNOSIS — E876 Hypokalemia: Secondary | ICD-10-CM | POA: Diagnosis present

## 2016-01-29 DIAGNOSIS — Z7982 Long term (current) use of aspirin: Secondary | ICD-10-CM | POA: Diagnosis not present

## 2016-01-29 DIAGNOSIS — Z933 Colostomy status: Secondary | ICD-10-CM

## 2016-01-29 DIAGNOSIS — Z79899 Other long term (current) drug therapy: Secondary | ICD-10-CM | POA: Diagnosis not present

## 2016-01-29 DIAGNOSIS — M1990 Unspecified osteoarthritis, unspecified site: Secondary | ICD-10-CM | POA: Diagnosis present

## 2016-01-29 DIAGNOSIS — Z9049 Acquired absence of other specified parts of digestive tract: Secondary | ICD-10-CM | POA: Diagnosis not present

## 2016-01-29 DIAGNOSIS — I169 Hypertensive crisis, unspecified: Secondary | ICD-10-CM | POA: Diagnosis present

## 2016-01-29 DIAGNOSIS — Z85038 Personal history of other malignant neoplasm of large intestine: Secondary | ICD-10-CM

## 2016-01-29 DIAGNOSIS — K5660 Unspecified intestinal obstruction: Principal | ICD-10-CM | POA: Diagnosis present

## 2016-01-29 HISTORY — DX: Unspecified osteoarthritis, unspecified site: M19.90

## 2016-01-29 LAB — URINALYSIS COMPLETE WITH MICROSCOPIC (ARMC ONLY)
BILIRUBIN URINE: NEGATIVE
GLUCOSE, UA: NEGATIVE mg/dL
Nitrite: NEGATIVE
PH: 6 (ref 5.0–8.0)
Protein, ur: >500 mg/dL — AB
Specific Gravity, Urine: 1.015 (ref 1.005–1.030)

## 2016-01-29 LAB — COMPREHENSIVE METABOLIC PANEL
ALBUMIN: 3.2 g/dL — AB (ref 3.5–5.0)
ALK PHOS: 53 U/L (ref 38–126)
ALT: 10 U/L — ABNORMAL LOW (ref 14–54)
ANION GAP: 11 (ref 5–15)
AST: 20 U/L (ref 15–41)
BILIRUBIN TOTAL: 1.1 mg/dL (ref 0.3–1.2)
BUN: 16 mg/dL (ref 6–20)
CALCIUM: 7.5 mg/dL — AB (ref 8.9–10.3)
CO2: 21 mmol/L — ABNORMAL LOW (ref 22–32)
Chloride: 109 mmol/L (ref 101–111)
Creatinine, Ser: 1 mg/dL (ref 0.44–1.00)
GFR calc non Af Amer: 51 mL/min — ABNORMAL LOW (ref >60)
GFR, EST AFRICAN AMERICAN: 59 mL/min — AB (ref >60)
GLUCOSE: 126 mg/dL — AB (ref 65–99)
POTASSIUM: 3.2 mmol/L — AB (ref 3.5–5.1)
SODIUM: 141 mmol/L (ref 135–145)
Total Protein: 6.2 g/dL — ABNORMAL LOW (ref 6.5–8.1)

## 2016-01-29 LAB — CBC
HCT: 41.2 % (ref 35.0–47.0)
Hemoglobin: 14.1 g/dL (ref 12.0–16.0)
MCH: 30.7 pg (ref 26.0–34.0)
MCHC: 34.3 g/dL (ref 32.0–36.0)
MCV: 89.6 fL (ref 80.0–100.0)
PLATELETS: 163 10*3/uL (ref 150–440)
RBC: 4.6 MIL/uL (ref 3.80–5.20)
RDW: 14.2 % (ref 11.5–14.5)
WBC: 7.2 10*3/uL (ref 3.6–11.0)

## 2016-01-29 LAB — LIPASE, BLOOD: Lipase: 17 U/L (ref 11–51)

## 2016-01-29 MED ORDER — HYDROMORPHONE HCL 1 MG/ML IJ SOLN
1.0000 mg | Freq: Once | INTRAMUSCULAR | Status: AC
  Administered 2016-01-29: 1 mg via INTRAVENOUS
  Filled 2016-01-29: qty 1

## 2016-01-29 MED ORDER — CARVEDILOL 12.5 MG PO TABS
50.0000 mg | ORAL_TABLET | Freq: Two times a day (BID) | ORAL | Status: DC
  Administered 2016-01-29 – 2016-01-31 (×4): 50 mg via ORAL
  Filled 2016-01-29 (×4): qty 4

## 2016-01-29 MED ORDER — DEXTROSE IN LACTATED RINGERS 5 % IV SOLN: INTRAVENOUS | Status: DC

## 2016-01-29 MED ORDER — IOHEXOL 240 MG/ML SOLN: 25.0000 mL | Freq: Once | INTRAMUSCULAR | Status: DC | PRN

## 2016-01-29 MED ORDER — PANTOPRAZOLE SODIUM 40 MG IV SOLR
40.0000 mg | Freq: Every day | INTRAVENOUS | Status: DC
  Administered 2016-01-29 – 2016-01-30 (×2): 40 mg via INTRAVENOUS
  Filled 2016-01-29 (×2): qty 40

## 2016-01-29 MED ORDER — ONDANSETRON HCL 4 MG/2ML IJ SOLN
4.0000 mg | Freq: Once | INTRAMUSCULAR | Status: AC
  Administered 2016-01-29: 4 mg via INTRAVENOUS
  Filled 2016-01-29: qty 2

## 2016-01-29 MED ORDER — HYDROMORPHONE HCL 1 MG/ML IJ SOLN
1.0000 mg | Freq: Once | INTRAMUSCULAR | Status: AC
  Administered 2016-01-29: 1 mg via INTRAVENOUS

## 2016-01-29 MED ORDER — ASPIRIN EC 81 MG PO TBEC
81.0000 mg | DELAYED_RELEASE_TABLET | Freq: Every day | ORAL | Status: DC
  Administered 2016-01-29 – 2016-01-31 (×3): 81 mg via ORAL
  Filled 2016-01-29 (×3): qty 1

## 2016-01-29 MED ORDER — HYDROMORPHONE HCL 1 MG/ML IJ SOLN
INTRAMUSCULAR | Status: AC
  Filled 2016-01-29: qty 1

## 2016-01-29 MED ORDER — HYDRALAZINE HCL 20 MG/ML IJ SOLN: 10.0000 mg | INTRAMUSCULAR | Status: DC | PRN

## 2016-01-29 MED ORDER — AMLODIPINE BESYLATE 10 MG PO TABS
10.0000 mg | ORAL_TABLET | Freq: Every day | ORAL | Status: DC
  Administered 2016-01-29 – 2016-01-31 (×3): 10 mg via ORAL
  Filled 2016-01-29 (×3): qty 1

## 2016-01-29 MED ORDER — SODIUM CHLORIDE 0.9 % IV BOLUS (SEPSIS)
1000.0000 mL | Freq: Once | INTRAVENOUS | Status: AC
  Administered 2016-01-29: 1000 mL via INTRAVENOUS

## 2016-01-29 MED ORDER — KCL IN DEXTROSE-NACL 20-5-0.9 MEQ/L-%-% IV SOLN
INTRAVENOUS | Status: DC
  Administered 2016-01-29 – 2016-01-31 (×3): via INTRAVENOUS
  Filled 2016-01-29 (×6): qty 1000

## 2016-01-29 MED ORDER — ONDANSETRON HCL 4 MG/2ML IJ SOLN: 4.0000 mg | Freq: Four times a day (QID) | INTRAMUSCULAR | Status: DC | PRN

## 2016-01-29 MED ORDER — LABETALOL HCL 5 MG/ML IV SOLN
20.0000 mg | Freq: Once | INTRAVENOUS | Status: AC
  Administered 2016-01-29: 20 mg via INTRAVENOUS

## 2016-01-29 MED ORDER — CLONIDINE HCL 0.2 MG/24HR TD PTWK: 0.2000 mg | MEDICATED_PATCH | TRANSDERMAL | Status: DC

## 2016-01-29 MED ORDER — MORPHINE SULFATE (PF) 2 MG/ML IV SOLN: 2.0000 mg | INTRAVENOUS | Status: DC | PRN

## 2016-01-29 MED ORDER — LISINOPRIL 20 MG PO TABS
20.0000 mg | ORAL_TABLET | Freq: Every day | ORAL | Status: DC
  Administered 2016-01-29 – 2016-01-31 (×3): 20 mg via ORAL
  Filled 2016-01-29 (×3): qty 1

## 2016-01-29 MED ORDER — DEXTROSE 5 % IV SOLN
1.0000 g | Freq: Once | INTRAVENOUS | Status: AC
  Administered 2016-01-29: 1 g via INTRAVENOUS
  Filled 2016-01-29: qty 10

## 2016-01-29 MED ORDER — LABETALOL HCL 5 MG/ML IV SOLN
INTRAVENOUS | Status: AC
  Administered 2016-01-29: 20 mg via INTRAVENOUS
  Filled 2016-01-29: qty 4

## 2016-01-29 MED ORDER — LABETALOL HCL 5 MG/ML IV SOLN
20.0000 mg | Freq: Once | INTRAVENOUS | Status: AC
  Administered 2016-01-29: 20 mg via INTRAVENOUS
  Filled 2016-01-29: qty 4

## 2016-01-29 MED ORDER — ONDANSETRON 8 MG PO TBDP: 4.0000 mg | ORAL_TABLET | Freq: Four times a day (QID) | ORAL | Status: DC | PRN

## 2016-01-29 MED ORDER — ENOXAPARIN SODIUM 40 MG/0.4ML ~~LOC~~ SOLN
40.0000 mg | SUBCUTANEOUS | Status: DC
  Administered 2016-01-29 – 2016-01-30 (×2): 40 mg via SUBCUTANEOUS
  Filled 2016-01-29 (×2): qty 0.4

## 2016-01-29 MED ORDER — IOHEXOL 300 MG/ML  SOLN
100.0000 mL | Freq: Once | INTRAMUSCULAR | Status: AC | PRN
  Administered 2016-01-29: 100 mL via INTRAVENOUS

## 2016-01-29 NOTE — Progress Notes (Signed)
Primary RN notified Dr. Leslye Peer of increased BP. Requested that Pt be made NPO except ice and meds to received Daily BP meds.  Orders received

## 2016-01-29 NOTE — ED Notes (Signed)
Nausea and vomiting began 5p yesterday, abdominal pain

## 2016-01-29 NOTE — ED Provider Notes (Signed)
[facility] Emergency Department Provider Note  ____________________________________________  Time seen: 9:25 AM  I have reviewed the triage vital signs and the nursing notes.   HISTORY  Chief Complaint Emesis    HPI ZOIEE KALINOWSKI is a 82 y.o. female who complains of nausea vomiting and generalized abdominal pain that started at 5 PM yesterday. She has a history of colostomy and also notes that she's had no colostomy output since that time. Denies any trauma or recent illness. No chest pain shortness of breath or fever. Abdominal pain is colicky with constant, severe in intensity, nonradiating. No aggravating or alleviating factors. No hematemesis.     Past Medical History  Diagnosis Date  . Hypertension   . Hypercalcemia   . Malignant neoplasm (HCC)     of descending colon  . Colon cancer [facility])      Patient Active Problem List   Diagnosis Date Noted  . Hypokalemia 10/21/2015  . Malignant hypertension 10/21/2015  . Weakness 10/21/2015     Past Surgical History  Procedure Laterality Date  . Colostomy     Partial colectomy  Current Outpatient Rx  Name  Route  Sig  Dispense  Refill  . amitriptyline (ELAVIL) 10 MG tablet   Oral   Take 1 tablet by mouth daily.      1   . amLODipine (NORVASC) 10 MG tablet   Oral   Take 10 mg by mouth daily.      0   . aspirin EC 81 MG tablet   Oral   Take 81 mg by mouth daily.         . carvedilol (COREG) 25 MG tablet   Oral   Take 50 mg by mouth 2 (two) times daily.       0   . cholecalciferol (VITAMIN D) 1000 units tablet   Oral   Take 1,000 Units by mouth daily.         . Cyanocobalamin (RA VITAMIN B-12 TR) 1000 MCG TBCR   Oral   Take 1,000 mcg by mouth daily.         . furosemide (LASIX) 20 MG tablet   Oral   Take 20 mg by mouth daily.      0   . ibuprofen (ADVIL,MOTRIN) 200 MG tablet   Oral   Take 200 mg by mouth every 6 (six) hours as needed. For pain.         Marland Kitchen  lisinopril (PRINIVIL,ZESTRIL) 40 MG tablet   Oral   Take 1 tablet (40 mg total) by mouth daily. Patient taking differently: Take 20 mg by mouth daily.    60 tablet   1   . pantoprazole (PROTONIX) 40 MG tablet   Oral   Take 40 mg by mouth every morning.      0   . traMADol (ULTRAM) 50 MG tablet   Oral   Take 50 mg by mouth every 8 (eight) hours as needed. For pain.      0   . triamcinolone cream (KENALOG) 0.1 %   Topical   Apply 1 application topically 2 (two) times daily.            Allergies Review of patient's allergies indicates no known allergies.   Family History  Problem Relation Age of Onset  . CAD Brother     Social History Social History  Substance Use Topics  . Smoking status: Never Smoker   . Smokeless tobacco: None  . Alcohol  Use: No    Review of Systems  Constitutional:   No fever or chills. No weight changes Eyes:   No blurry vision or double vision.  ENT:   No sore throat.  Cardiovascular:   No chest pain. Respiratory:   No dyspnea or cough. Gastrointestinal:   Generalized abdominal pain with vomiting and decreased colostomy output.Marland Kitchen  No BRBPR or melena. Genitourinary:   Negative for dysuria or difficulty urinating. Musculoskeletal:   Negative for back pain. No joint swelling or pain. Skin:   Negative for rash. Neurological:   Negative for headaches, focal weakness or numbness. Psychiatric:  No anxiety or depression.   Endocrine:  No changes in energy or sleep difficulty.  10-point ROS otherwise negative.  ____________________________________________   PHYSICAL EXAM:  VITAL SIGNS: ED Triage Vitals  Enc Vitals Group     BP 01/29/16 0857 211/101 mmHg     Pulse Rate 01/29/16 0857 85     Resp 01/29/16 0857 20     Temp 01/29/16 0857 97.6 F (36.4 C)     Temp Source 01/29/16 0857 Oral     SpO2 01/29/16 0857 100 %     Weight 01/29/16 0857 200 lb (90.719 kg)     Height 01/29/16 0857 5\' 6"  (1.676 m)     Head Cir --      Peak Flow  --      Pain Score 01/29/16 0859 7     Pain Loc --      Pain Edu? --      Excl. in [place]? --     Vital signs reviewed, nursing assessments reviewed.   Constitutional:   Alert and oriented. Ill-appearing. Eyes:   No scleral icterus. No conjunctival pallor. PERRL. EOMI ENT   Head:   Normocephalic and atraumatic.   Nose:   No congestion/rhinnorhea. No septal hematoma   Mouth/Throat:   Dry mucous membranes, no pharyngeal erythema. No peritonsillar mass.    Neck:   No stridor. No SubQ emphysema. No meningismus. Hematological/Lymphatic/Immunilogical:   No cervical lymphadenopathy. Cardiovascular:   RRR. Symmetric bilateral radial and DP pulses.  No murmurs.  Respiratory:   Normal respiratory effort without tachypnea nor retractions. Breath sounds are clear and equal bilaterally. No wheezes/rales/rhonchi. Gastrointestinal:   Soft , moderately distended. Empty colostomy bag. Stoma noninflamed and well perfused. On deep palpation of the abdomen in the area around the colostomy, there is fullness and tenderness.. There is no CVA tenderness.  No rebound, rigidity, or guarding. Genitourinary:   deferred Musculoskeletal:   Nontender with normal range of motion in all extremities. No joint effusions.  No lower extremity tenderness.  No edema. Neurologic:   Normal speech and language.  CN 2-10 normal. Motor grossly intact. No gross focal neurologic deficits are appreciated.  Skin:    Skin is warm, dry and intact. No rash noted.  No petechiae, purpura, or bullae. Psychiatric:   Mood and affect are normal. ____________________________________________    LABS (pertinent positives/negatives) (all labs ordered are listed, but only abnormal results are displayed) Labs Reviewed  COMPREHENSIVE METABOLIC PANEL - Abnormal; Notable for the following:    Potassium 3.2 (*)    CO2 21 (*)    Glucose, Bld 126 (*)    Calcium 7.5 (*)    Total Protein 6.2 (*)    Albumin 3.2 (*)    ALT 10 (*)     GFR calc non Af Amer 51 (*)    GFR calc Af Amer 59 (*)    All  other components within normal limits  URINALYSIS COMPLETEWITH MICROSCOPIC ([facility] ONLY) - Abnormal; Notable for the following:    Color, Urine YELLOW (*)    APPearance CLOUDY (*)    Ketones, ur 1+ (*)    Hgb urine dipstick 1+ (*)    Protein, ur >500 (*)    Leukocytes, UA TRACE (*)    Bacteria, UA RARE (*)    Squamous Epithelial / LPF 0-5 (*)    All other components within normal limits  URINE CULTURE  LIPASE, BLOOD  CBC   ____________________________________________   EKG  Interpreted by me Normal sinus rhythm rate of 85, left axis, normal intervals. Poor R-wave progression in anterior precordial leads. Normal ST segments and T waves.  ____________________________________________    RADIOLOGY  CT abdomen and pelvis shows evidence of small bowel obstruction, questionable peristomal hernia.  ____________________________________________   PROCEDURES   ____________________________________________   INITIAL IMPRESSION / ASSESSMENT AND PLAN / ED COURSE  Pertinent labs & imaging results that were available during my care of the patient were reviewed by me and considered in my medical decision making (see chart for details).  Patient presents with abdominal pain nausea and vomiting, concern for obstruction. Follow-up labs, check CT abdomen pelvis. IV fluids, Zofran and Dilaudid for symptom relief.  ----------------------------------------- 1:42 PM on 01/29/2016 -----------------------------------------  CT result discussed with radiology. At that time I attempted to consult surgery but they're unavailable in the OR. Just now it approximate 1:30 is able to discuss the case with general surgery who will consult on the patient.  We'll follow up the recommendations.   ----------------------------------------- 2:30 PM on 01/29/2016 -----------------------------------------  Discussed with surgery Dr. Dahlia Byes.  Advises he'll admit the patient. Requests an NG tube. We'll try to place an NG tube in the emergency department and obtain follow-up KUB. Further management by surgery team. We'll give some antihypertensives as well due to blood pressure being somewhat elevated.    ____________________________________________   FINAL CLINICAL IMPRESSION(S) / ED DIAGNOSES  Final diagnoses:  Small bowel obstruction ([place])      Carrie Mew, MD 01/29/16 1431

## 2016-01-29 NOTE — ED Notes (Signed)
Pt finished CT contrast, CT aware. 

## 2016-01-29 NOTE — H&P (Signed)
Patient ID: [name], female   DOB: [birth year], 82 y.o.   MRN: [id number]  History of Present Illness [name] is a 82 y.o. female with abdominal pain. She reports that since 5:00 pm yesterday she has diffuse abdominal pain Oma intermittent moderate in intensity and colicky type. Also accompanied by decrease in the stoma output. She describes that she had multiple episodes of emesis. She came into the emergency room that she feels better her HEENT has subsided as well as her emesis. Surgical history significant for a left colectomy for cancer more than 26 years ago at [facility] and this was accompanied by an end colostomy. She has had issues with nausea and vomiting and constipation on and off for last several years has seen GI and no recent colonoscopies. She is 23 she is competent and she is able to walk for a few yards with a cane. No recent MIs she does have uncontrolled hypertension and in the ER her blood pressure has been up to the [id number] systolic  Past Medical History Past Medical History  Diagnosis Date  . Hypertension   . Hypercalcemia   . Malignant neoplasm (HCC)     of descending colon  . Colon cancer [facility])       Past Surgical History  Procedure Laterality Date  . Colostomy      No Known Allergies  Current Facility-Administered Medications  Medication Dose Route Frequency Provider Last Rate Last Dose  . HYDROmorphone (DILAUDID) 1 MG/ML injection           . iohexol (OMNIPAQUE) 240 MG/ML injection 25 mL  25 mL Oral Once PRN Carrie Mew, MD       Current Outpatient Prescriptions  Medication Sig Dispense Refill  . amitriptyline (ELAVIL) 10 MG tablet Take 1 tablet by mouth daily.  1  . amLODipine (NORVASC) 10 MG tablet Take 10 mg by mouth daily.  0  . aspirin EC 81 MG tablet Take 81 mg by mouth daily.    . carvedilol (COREG) 25 MG tablet Take 50 mg by mouth 2 (two) times daily.   0  . cholecalciferol (VITAMIN D) 1000 units tablet Take 1,000 Units by  mouth daily.    . Cyanocobalamin (RA VITAMIN B-12 TR) 1000 MCG TBCR Take 1,000 mcg by mouth daily.    . furosemide (LASIX) 20 MG tablet Take 20 mg by mouth daily.  0  . ibuprofen (ADVIL,MOTRIN) 200 MG tablet Take 200 mg by mouth every 6 (six) hours as needed. For pain.    Marland Kitchen lisinopril (PRINIVIL,ZESTRIL) 40 MG tablet Take 1 tablet (40 mg total) by mouth daily. (Patient taking differently: Take 20 mg by mouth daily. ) 60 tablet 1  . pantoprazole (PROTONIX) 40 MG tablet Take 40 mg by mouth every morning.  0  . traMADol (ULTRAM) 50 MG tablet Take 50 mg by mouth every 8 (eight) hours as needed. For pain.  0  . triamcinolone cream (KENALOG) 0.1 % Apply 1 application topically 2 (two) times daily.      Family History Family History  Problem Relation Age of Onset  . CAD Brother        Social History Social History  Substance Use Topics  . Smoking status: Never Smoker   . Smokeless tobacco: None  . Alcohol Use: No        ROS upon review of system was performed and is otherwise negative other than what is stated in the history of present illness   Physical  Exam Blood pressure 182/75, pulse 80, temperature 98.2 F (36.8 C), temperature source Oral, resp. rate 13, height 5\' 6"  (1.676 m), weight 90.719 kg (200 lb), SpO2 98 %.  CONSTITUTIONAL: Obese elderly female in no acute distress EYES: Pupils equal, round, and reactive to light, Sclera non-icteric. EARS, NOSE, MOUTH AND THROAT: The oropharynx is clear. Oral mucosa is pink and moist. Hearing is intact to voice.  NECK: Trachea is midline, and there is no jugular venous distension. Thyroid is without palpable abnormalities. LYMPH NODES:  Lymph nodes in the neck are not enlarged. RESPIRATORY:  Lungs are clear, and breath sounds are equal bilaterally. Normal respiratory effort without pathologic use of accessory muscles. CARDIOVASCULAR: Heart is regular without murmurs, gallops, or rubs. GI: The abdomen is  soft, nontender, and  nondistended. There were no palpable masses. There was no hepatosplenomegaly.  There is a left lower quadrant colostomy that is pink and patent there is actually no evidence of pain and there is a large parastomal hernia but no evidence of tenderness in that area. GU: Deferred MUSCULOSKELETAL:  Normal muscle strength and tone in all four extremities.    SKIN: Skin turgor is normal. There are no pathologic skin lesions.  NEUROLOGIC:  Motor and sensation is grossly normal.  Cranial nerves are grossly intact. PSYCH:  Alert and oriented to person, place and time. Affect is normal.  Data Reviewed CT scan and medical records reviewed and discussed with attending radiologist there is evidence of small bowel dilation and there is a complex parastomal hernia sure there is a definitive closed-loop obstruction after having a discussion with the radiologist he cannot be [phone] certain about this closed-loop obstruction given her clinical is an area which is not behaving as 1.  I have personally reviewed the patient's imaging and medical records.    Assessment/Plan She'll with bowel obstruction multiple abdominal operations and a parastomal hernia. We will give her a trial of medical management with an NG tube nothing by mouth. We'll involve the hospitalist service for hypertension control given that her numbers are pretty steep. Her abdomen is soft and normal white count she is afebrile non-tachycardic all this goes against a closed loop bowel obstruction. There is no evidence of pneumatosis or bowel compromise on CT scan. I discussed with the patient in detail about the CT findings and my recommendation for NG tube decompression with nothing by mouth and serial abdominal exams and x-rays. Discussed with her the possibility of surgical intervention if this does not improve within the next 48 hours or so. Excessive counseling provided and all questions answered  [place], MD [place] 01/29/2016,  3:12 PM

## 2016-01-29 NOTE — Consult Note (Signed)
[place] [facility] [place] NAME: [name]    MR#:  [id number]  DATE OF BIRTH:  [birth year]  DATE OF ADMISSION:  01/29/2016  PRIMARY CARE PHYSICIAN: Rusty Aus., MD   REQUESTING/REFERRING PHYSICIAN: Diego Pabon  CHIEF COMPLAINT:   Chief Complaint  Patient presents with  . Emesis    HISTORY OF PRESENT ILLNESS:  [name]  is a 82 y.o. female with a known history of hypertension, colon cancer status post resection with colostomy. She presents with nausea vomiting and abdominal pain. Nausea vomiting when on all night, too many times to count. No hematemesis. Abdominal pain 10 out of 10 intensity described as labor pain. At the time when I saw her she had no pain at this point. And she stated that her colostomy started to put out a little dark stool. She emptied the bag before I got in the room. As per son, even at home this nausea vomiting can happen and it usually settles down on that sound.  PAST MEDICAL HISTORY:   Past Medical History  Diagnosis Date  . Hypertension   . Hypercalcemia   . Malignant neoplasm (HCC)     of descending colon  . Colon cancer ([place])   . Arthritis     PAST SURGICAL HISTOIRY:   Past Surgical History  Procedure Laterality Date  . Colostomy    . Cholecystectomy    . Cataracts    . Colon cancer surgery      SOCIAL HISTORY:   Social History  Substance Use Topics  . Smoking status: Never Smoker   . Smokeless tobacco: Not on file  . Alcohol Use: No    FAMILY HISTORY:   Family History  Problem Relation Age of Onset  . CAD Brother   . Congestive Heart Failure Mother   . Breast cancer Daughter   . Alcohol abuse Son     DRUG ALLERGIES:  No Known Allergies  REVIEW OF SYSTEMS:  CONSTITUTIONAL: No fever, positive for weakness.  EYES: Right eye double vision which has resolved.  EARS, NOSE, AND THROAT: No tinnitus or ear pain. Throat is bothersome with the tube in her  nose. RESPIRATORY: No cough, shortness of breath, wheezing or hemoptysis.  CARDIOVASCULAR: No chest pain, orthopnea, edema.  GASTROINTESTINAL: Positive for nausea, vomiting, and abdominal pain.  GENITOURINARY: Positive for dysuria, no hematuria.  ENDOCRINE: No polyuria, nocturia,  HEMATOLOGY: No anemia, easy bruising or bleeding SKIN: No rash or lesion. MUSCULOSKELETAL: Positive for joint pain and arthritis.   NEUROLOGIC: No tingling, numbness, weakness.  PSYCHIATRY: No anxiety or depression.   MEDICATIONS AT HOME:   Prior to Admission medications   Medication Sig Start Date End Date Taking? Authorizing Provider  amitriptyline (ELAVIL) 10 MG tablet Take 1 tablet by mouth daily. 01/24/16  Yes Historical Provider, MD  amLODipine (NORVASC) 10 MG tablet Take 10 mg by mouth daily. 09/11/15  Yes Historical Provider, MD  aspirin EC 81 MG tablet Take 81 mg by mouth daily.   Yes Historical Provider, MD  carvedilol (COREG) 25 MG tablet Take 50 mg by mouth 2 (two) times daily.  09/11/15  Yes Historical Provider, MD  cholecalciferol (VITAMIN D) 1000 units tablet Take 1,000 Units by mouth daily.   Yes Historical Provider, MD  Cyanocobalamin (RA VITAMIN B-12 TR) 1000 MCG TBCR Take 1,000 mcg by mouth daily.   Yes Historical Provider, MD  furosemide (LASIX) 20 MG tablet Take 20 mg by mouth daily. 09/11/15  Yes  Historical Provider, MD  ibuprofen (ADVIL,MOTRIN) 200 MG tablet Take 200 mg by mouth every 6 (six) hours as needed. For pain.   Yes Historical Provider, MD  lisinopril (PRINIVIL,ZESTRIL) 40 MG tablet Take 1 tablet (40 mg total) by mouth daily. Patient taking differently: Take 20 mg by mouth daily.  10/23/15  Yes Henreitta Leber, MD  pantoprazole (PROTONIX) 40 MG tablet Take 40 mg by mouth every morning. 09/11/15  Yes Historical Provider, MD  traMADol (ULTRAM) 50 MG tablet Take 50 mg by mouth every 8 (eight) hours as needed. For pain. 09/11/15  Yes Historical Provider, MD  triamcinolone cream  (KENALOG) 0.1 % Apply 1 application topically 2 (two) times daily.   Yes Historical Provider, MD      VITAL SIGNS:  Blood pressure 180/91, pulse 91, temperature 97.7 F (36.5 C), temperature source Oral, resp. rate 20, height 5\' 6"  (1.676 m), weight 90.719 kg (200 lb), SpO2 97 %.  PHYSICAL EXAMINATION:  GENERAL:  82 y.o.-year-old patient lying in the bed with no acute distress.  EYES: Pupils equal, round, reactive to light and accommodation. No scleral icterus. Extraocular muscles intact.  HEENT: Head atraumatic, normocephalic. Oropharynx and nasopharynx clear.  NECK:  Supple, no jugular venous distention. No thyroid enlargement, no tenderness.  LUNGS: Normal breath sounds bilaterally, no wheezing, rales,rhonchi or crepitation. No use of accessory muscles of respiration.  CARDIOVASCULAR: S1, S2 normal. No murmurs, rubs, or gallops.  ABDOMEN: Soft, nontender, nondistended. Bowel sounds decreased. No organomegaly or mass.  EXTREMITIES: 3+ pedal edema, no cyanosis, or clubbing.  NEUROLOGIC: Cranial nerves II through XII are intact. Muscle strength 5/5 in all extremities. Sensation intact. Gait not checked.  PSYCHIATRIC: The patient is alert and oriented x 3.  SKIN: No obvious rash, lesion, or ulcer.   LABORATORY PANEL:   CBC  Recent Labs Lab 01/29/16 0919  WBC 7.2  HGB 14.1  HCT 41.2  PLT 163   ------------------------------------------------------------------------------------------------------------------  Chemistries   Recent Labs Lab 01/29/16 0919  NA 141  K 3.2*  CL 109  CO2 21*  GLUCOSE 126*  BUN 16  CREATININE 1.00  CALCIUM 7.5*  AST 20  ALT 10*  ALKPHOS 53  BILITOT 1.1   ------------------------------------------------------------------------------------------------------------------   RADIOLOGY:  Ct Abdomen Pelvis W Contrast  01/29/2016  CLINICAL DATA:  Nausea vomiting since 5 a.m. yesterday. History colostomy for colon cancer in 1990. EXAM: CT ABDOMEN  AND PELVIS WITH CONTRAST TECHNIQUE: Multidetector CT imaging of the abdomen and pelvis was performed using the standard protocol following bolus administration of intravenous contrast. CONTRAST:  184mL OMNIPAQUE IOHEXOL 300 MG/ML  SOLN COMPARISON:  None FINDINGS: Lower chest: Lung bases are clear. Hepatobiliary: Postcholecystectomy. Pneumobilia present in the LEFT and RIGHT hepatic lobe as well as the common hepatic duct. No biliary duct dilatation. Pancreas: Pancreas is normal. No ductal dilatation. No pancreatic inflammation. Spleen: Normal spleen Adrenals/urinary tract: Adrenal glands are normal. New the kidneys enhance symmetrically. Low-density lesion in the the RIGHT kidney is simple fluid attenuation. Bilateral extrarenal pelves. Ureters are trauma. No ureteral calculi. No bladder calculi. Stomach/Bowel: Oral contrast in non distended stomach. Duodenum and proximal small bowel are normal caliber and fill with oral contrast. There is poor progression of the oral contrast beyond the proximal small bowel. Dilated loops of small bowel distally measuring up to 4.6 cm with air-fluid levels. The most distal small bowel is collapsed leading up to the terminal ileum. No clear transition point abdomen identified. LEFT lower quadrant colostomy. There is extensive fat herniating  into the colostomy sac. There is fluid within hernia sac peripherally. Difficult to tell if these fluid represent small-bowel or loculated fluid. Concern for ileus small bowel entering the peristomal hernia with obstruction. The colon is mildly decompressed. Vascular/Lymphatic: Abdominal aorta is normal caliber with atherosclerotic calcification. There is no retroperitoneal or periportal lymphadenopathy. No pelvic lymphadenopathy. Reproductive: Post hysterectomy. Other: No free fluid. Musculoskeletal: No aggressive osseous lesion. IMPRESSION: 1. Small bowel obstruction pattern. The obstruction is present within the mid small bowel which is  dilated and fluid-filled. The findings concerning for closed loop obstruction with decompressed proximal and distal small bowel. The obstructing lesions is not clearly identified however there is a LEFT lower pole peristomal hernia which contains loops of small bowel versus loculated fluid which could be the source of obstruction. 2. No pneumatosis or portal venous gas. Pneumobilia presumably related to prior sphincterotomy. For Findings conveyed toPHILLIP STAFFORD on 01/29/2016  at12:07. Electronically Signed   By: Suzy Bouchard M.D.   On: 01/29/2016 12:09    EKG:   Ordered by me  IMPRESSION AND PLAN:   1. Accelerated hypertension. Likely worsen because of pain and nausea vomiting and unable to take her medications. When necessary IV medications. Since she is starting to have output from the colostomy we may be able to do oral medications. 2. Small bowel obstruction. Surgery to do conservative management for now. Patient is feeling better this evening and she stated that she started having colostomy output 3. Hypokalemia- add potassium to IV fluids 4. Gastroesophageal reflux disease without esophagitis continue Protonix 5. History of arthritis 6. Follow-up results of urine culture. Patient was started on Rocephin 7. History of colon cancer  All the records are reviewed and case discussed with Consulting provider. Management plans discussed with the patient, family and they are in agreement.  CODE STATUS: Full code  TOTAL TIME TAKING CARE OF THIS PATIENT: 50 minutes.    Loletha Grayer M.D on 01/29/2016 at 7:23 PM  Between 7am to 6pm - Pager - [phone]  After 6pm go to [email or web address] - password [email or web address] [facility]  Office  [phone]  CC: Primary care Physician: Rusty Aus., MD

## 2016-01-29 NOTE — ED Notes (Signed)
Pt states she is feeling better and has started drinking PO CT contrast.the patient has been instructed if she begins feeling sick again or cant tolerate to let us know.

## 2016-01-30 ENCOUNTER — Inpatient Hospital Stay: Payer: Medicare Other

## 2016-01-30 LAB — BASIC METABOLIC PANEL
ANION GAP: 6 (ref 5–15)
BUN: 18 mg/dL (ref 6–20)
CHLORIDE: 112 mmol/L — AB (ref 101–111)
CO2: 25 mmol/L (ref 22–32)
Calcium: 6.7 mg/dL — ABNORMAL LOW (ref 8.9–10.3)
Creatinine, Ser: 1.11 mg/dL — ABNORMAL HIGH (ref 0.44–1.00)
GFR calc Af Amer: 52 mL/min — ABNORMAL LOW (ref >60)
GFR, EST NON AFRICAN AMERICAN: 45 mL/min — AB (ref >60)
Glucose, Bld: 110 mg/dL — ABNORMAL HIGH (ref 65–99)
POTASSIUM: 3.3 mmol/L — AB (ref 3.5–5.1)
SODIUM: 143 mmol/L (ref 135–145)

## 2016-01-30 LAB — CBC
HEMATOCRIT: 34.9 % — AB (ref 35.0–47.0)
HEMOGLOBIN: 11.8 g/dL — AB (ref 12.0–16.0)
MCH: 31.3 pg (ref 26.0–34.0)
MCHC: 33.8 g/dL (ref 32.0–36.0)
MCV: 92.7 fL (ref 80.0–100.0)
Platelets: 142 10*3/uL — ABNORMAL LOW (ref 150–440)
RBC: 3.76 MIL/uL — AB (ref 3.80–5.20)
RDW: 14.6 % — ABNORMAL HIGH (ref 11.5–14.5)
WBC: 7.1 10*3/uL (ref 3.6–11.0)

## 2016-01-30 LAB — MAGNESIUM: MAGNESIUM: 1 mg/dL — AB (ref 1.7–2.4)

## 2016-01-30 MED ORDER — MAGNESIUM SULFATE 2 GM/50ML IV SOLN
2.0000 g | Freq: Once | INTRAVENOUS | Status: DC
  Filled 2016-01-30: qty 50

## 2016-01-30 MED ORDER — POTASSIUM CHLORIDE 10 MEQ/100ML IV SOLN
10.0000 meq | INTRAVENOUS | Status: AC
  Administered 2016-01-30 (×2): 10 meq via INTRAVENOUS
  Filled 2016-01-30 (×3): qty 100

## 2016-01-30 MED ORDER — CIPROFLOXACIN HCL 250 MG PO TABS
500.0000 mg | ORAL_TABLET | Freq: Two times a day (BID) | ORAL | Status: DC
  Administered 2016-01-30 – 2016-01-31 (×2): 500 mg via ORAL
  Filled 2016-01-30 (×2): qty 2

## 2016-01-30 MED ORDER — MAGNESIUM SULFATE 4 GM/100ML IV SOLN
4.0000 g | Freq: Once | INTRAVENOUS | Status: AC
  Administered 2016-01-30: 4 g via INTRAVENOUS
  Filled 2016-01-30: qty 100

## 2016-01-30 MED ORDER — MAGNESIUM SULFATE 4 GM/100ML IV SOLN
4.0000 g | Freq: Once | INTRAVENOUS | Status: AC
  Administered 2016-01-30: 2 g via INTRAVENOUS
  Filled 2016-01-30: qty 100

## 2016-01-30 NOTE — Evaluation (Signed)
Physical Therapy Evaluation Patient Details Name: [name] MRN: [id number] DOB: Aug 13, 1930 Today's Date: 01/30/2016   History of Present Illness  presented to ER secondary to abdominal pain; admitted with partial SBO.  Currently treating with conservative medical management NGT.  PMH significant for colon cancer with colectomy/colostomy (with peristomal hernia), HTN  Clinical Impression  Upon evaluation, patient alert and oriented; follows commands and demonstrates good insight/safety awareness.  Bilat UE/LE strength and ROM grossly WFL for basic transfers and mobility; denies pain at this time.  Able to complete bed mobility with mod indep; sit/stand, basic transfers and gait (220') with RW, cga/close sup.  Did require min assist (with multiple attempts) for sit/stand from lower surface height.  Gait speed slower than age-matched norms (10' walk time, 7-8 seconds), but patient with good awareness and use of compensatory strategies as needed. Would benefit from skilled PT during remaining hospitalization to promote continued mobility and functional endurance; anticipate no skilled PT needs upon discharge.    Follow Up Recommendations No PT follow up (anticipate no skilled PT needs upon discharge)    Equipment Recommendations       Recommendations for Other Services       Precautions / Restrictions Precautions Precautions: Fall Precaution Comments: LLQ colostomy Restrictions Weight Bearing Restrictions: No      Mobility  Bed Mobility Overal bed mobility: Modified Independent                Transfers Overall transfer level: Needs assistance Equipment used: Rolling walker (2 wheeled) Transfers: Sit to/from Stand Sit to Stand: Supervision;Min guard            Ambulation/Gait Ambulation/Gait assistance: Supervision;Min guard Ambulation Distance (Feet): 230 Feet Assistive device: Rolling walker (2 wheeled)   Gait velocity: 10' walk time, 7-8 seconds    General Gait Details: reciprocal stepping pattern with fair step height/length; decreased cadence and gait speed, but no overt buckling, LOB of safety concern.  Stairs            Wheelchair Mobility    Modified Rankin (Stroke Patients Only)       Balance Overall balance assessment: Needs assistance Sitting-balance support: No upper extremity supported;Feet supported Sitting balance-Leahy Scale: Good     Standing balance support: Bilateral upper extremity supported Standing balance-Leahy Scale: Fair                               Pertinent Vitals/Pain Pain Assessment: No/denies pain    Home Living Family/patient expects to be discharged to:: Private residence Living Arrangements: Spouse/significant other Available Help at Discharge: Family;Available 24 hours/day Type of Home: House Home Access: Level entry     Home Layout: One level Home Equipment: Walker - 4 wheels;Walker - 2 wheels      Prior Function Level of Independence: Independent with assistive device(s)         Comments: Mod indep for ADLs, household mobility (with RW) and limited community mobility (with [phone]); denies fall history in previous six months (reports last fall 5 years ago)     Hand Dominance        Extremity/Trunk Assessment   Upper Extremity Assessment: Overall WFL for tasks assessed (R shoulder elevation limited due to chronic injury (after fall five years prior))           Lower Extremity Assessment: Overall WFL for tasks assessed         Communication   Communication: No difficulties  Cognition Arousal/Alertness: Awake/alert Behavior During Therapy: WFL for tasks assessed/performed Overall Cognitive Status: Within Functional Limits for tasks assessed                      General Comments      Exercises Other Exercises Other Exercises: Toilet transfer, ambulatory with RW, cga/min assist.  sit/stand from standard toilet height with grab bars,  min assist (and multiple reps).  Standing balance for hygiene, clothing management, close sup.      Assessment/Plan    PT Assessment Patient needs continued PT services  PT Diagnosis Difficulty walking;Generalized weakness   PT Problem List Decreased activity tolerance;Decreased mobility;Cardiopulmonary status limiting activity  PT Treatment Interventions DME instruction;Gait training;Functional mobility training;Therapeutic activities;Therapeutic exercise;Balance training;Patient/family education   PT Goals (Current goals can be found in the Care Plan section) Acute Rehab PT Goals Patient Stated Goal: to return home PT Goal Formulation: With patient Time For Goal Achievement: 02/13/16 Potential to Achieve Goals: Good    Frequency Min 2X/week   Barriers to discharge        Co-evaluation               End of Session Equipment Utilized During Treatment: Gait belt Activity Tolerance: Patient tolerated treatment well Patient left: in chair;with call bell/phone within reach;with chair alarm set Nurse Communication: Mobility status         Time: [id number] PT Time Calculation (min) (ACUTE ONLY): 19 min   Charges:   PT Evaluation $PT Eval Low Complexity: 1 Procedure PT Treatments $Therapeutic Activity: 8-22 mins   PT G Codes:        Iris Tatsch H. Owens Shark, PT, DPT, NCS 01/30/2016, 3:40 PM [phone]

## 2016-01-30 NOTE — Progress Notes (Signed)
MEDICATION RELATED CONSULT NOTE - INITIAL   Pharmacy Consult for Electrolyte Management Indication: hypokalemia  No Known Allergies  Patient Measurements: Height: 5\' 5"  (165.1 cm) Weight: 206 lb 9.6 oz (93.713 kg) IBW/kg (Calculated) : 57 Adjusted Body Weight: na  Vital Signs: Temp: 97.9 F (36.6 C) (02/28 1232) Temp Source: Oral (02/28 1232) BP: 137/53 mmHg (02/28 1232) Pulse Rate: 69 (02/28 1232) Intake/Output from previous day: 02/27 0701 - 02/28 0700 In: 863.3 [I.V.:803.3; NG/GT:60] Out: 250 [Emesis/NG output:250] Intake/Output from this shift: Total I/O In: 1078.3 [I.V.:1078.3] Out: 200 [Urine:100; Stool:100]  Labs:  Recent Labs  01/29/16 0919 01/30/16 0503  WBC 7.2 7.1  HGB 14.1 11.8*  HCT 41.2 34.9*  PLT 163 142*  CREATININE 1.00 1.11*  MG  --  1.0*  ALBUMIN 3.2*  --   PROT 6.2*  --   AST 20  --   ALT 10*  --   ALKPHOS 53  --   BILITOT 1.1  --    Estimated Creatinine Clearance: 43.5 mL/min (by C-G formula based on Cr of 1.11).   Microbiology: Recent Results (from the past 720 hour(s))  Urine culture     Status: None (Preliminary result)   Collection Time: 01/29/16 12:21 PM  Result Value Ref Range Status   Specimen Description URINE, RANDOM  Final   Special Requests NONE  Final   Culture TOO YOUNG TO READ  Final   Report Status PENDING  Incomplete    Medical History: Past Medical History  Diagnosis Date  . Hypertension   . Hypercalcemia   . Malignant neoplasm (HCC)     of descending colon  . Colon cancer ([place])   . Arthritis     Medications:  Scheduled:  . amLODipine  10 mg Oral Daily  . aspirin EC  81 mg Oral Daily  . carvedilol  50 mg Oral BID  . enoxaparin (LOVENOX) injection  40 mg Subcutaneous Q24H  . lisinopril  20 mg Oral Daily  . magnesium sulfate 1 - 4 g bolus IVPB  2 g Intravenous Once  . magnesium sulfate 1 - 4 g bolus IVPB  4 g Intravenous Once  . pantoprazole (PROTONIX) IV  40 mg Intravenous QHS   Infusions:  .  dextrose 5 % and 0.9 % NaCl with KCl 20 mEq/L 100 mL/hr at 01/30/16 0522    Assessment: Patient is a 82yo female admitted for SBO with emesis. Pharmacy consulted for electrolyte management.  K=3.3 Mag=1.0  Patient received KCl 41mEq IV x 2 this morning and currently has an IV fluid of D5NS w/20 mEq of KCl running at 147ml/hr.  Goal of Therapy:  Maintain electrolytes WNL.  Plan:  Will order Magnesium Sulfate 4g IV x 2. Since patient has already received KCl bolus and in IV fluid will hold off on additional supplementation at this time. Will follow up on AM labs.  Paulina Fusi, PharmD, BCPS 01/30/2016 5:49 PM

## 2016-01-30 NOTE — Progress Notes (Signed)
[facility] [place] NAME: [name]    MR#:  [id number]  DATE OF BIRTH:  [birth year]  SUBJECTIVE:  CHIEF COMPLAINT:   Chief Complaint  Patient presents with  . Emesis  Feeling much better. No abdominal pain., NGT out. Tolerating CLD  REVIEW OF SYSTEMS:  Review of Systems  Constitutional: Negative for fever, weight loss, malaise/fatigue and diaphoresis.  HENT: Negative for ear discharge, ear pain, hearing loss, nosebleeds, sore throat and tinnitus.   Eyes: Negative for blurred vision and pain.  Respiratory: Negative for cough, hemoptysis, shortness of breath and wheezing.   Cardiovascular: Negative for chest pain, palpitations, orthopnea and leg swelling.  Gastrointestinal: Positive for nausea. Negative for heartburn, vomiting, abdominal pain, diarrhea, constipation and blood in stool.  Genitourinary: Negative for dysuria, urgency and frequency.  Musculoskeletal: Negative for myalgias and back pain.  Skin: Negative for itching and rash.  Neurological: Negative for dizziness, tingling, tremors, focal weakness, seizures, weakness and headaches.  Psychiatric/Behavioral: Negative for depression. The patient is not nervous/anxious.     DRUG ALLERGIES:  No Known Allergies VITALS:  Blood pressure 137/53, pulse 69, temperature 97.9 F (36.6 C), temperature source Oral, resp. rate 16, height 5\' 5"  (1.651 m), weight 93.713 kg (206 lb 9.6 oz), SpO2 95 %. PHYSICAL EXAMINATION:  Physical Exam  Constitutional: She is oriented to person, place, and time and well-developed, well-nourished, and in no distress.  HENT:  Head: Normocephalic and atraumatic.  Eyes: Conjunctivae and EOM are normal. Pupils are equal, round, and reactive to light.  Neck: Normal range of motion. Neck supple. No tracheal deviation present. No thyromegaly present.  Cardiovascular: Normal rate, regular rhythm and normal heart sounds.   Pulmonary/Chest: Effort normal  and breath sounds normal. No respiratory distress. She has no wheezes. She exhibits no tenderness.  Abdominal: Soft. Bowel sounds are normal. She exhibits no distension. There is no tenderness.  Musculoskeletal: Normal range of motion.  Neurological: She is alert and oriented to person, place, and time. No cranial nerve deficit.  Skin: Skin is warm and dry. No rash noted.  Psychiatric: Mood and affect normal.   LABORATORY PANEL:   CBC  Recent Labs Lab 01/30/16 0503  WBC 7.1  HGB 11.8*  HCT 34.9*  PLT 142*   ------------------------------------------------------------------------------------------------------------------ Chemistries   Recent Labs Lab 01/29/16 0919 01/30/16 0503  NA 141 143  K 3.2* 3.3*  CL 109 112*  CO2 21* 25  GLUCOSE 126* 110*  BUN 16 18  CREATININE 1.00 1.11*  CALCIUM 7.5* 6.7*  MG  --  1.0*  AST 20  --   ALT 10*  --   ALKPHOS 53  --   BILITOT 1.1  --    RADIOLOGY:  Dg Abd Acute W/chest  01/30/2016  CLINICAL DATA:  Diffuse abdominal pain, decreased stoma output, vomiting, remote history of left colectomy for malignancy EXAM: DG ABDOMEN ACUTE W/ 1V CHEST COMPARISON:  Abdominal pelvic CT scan of January 29, 2016 FINDINGS: The lungs are adequately inflated. The interstitial markings are coarse. The heart is normal in size. There is soft tissue fullness in the right paratracheal region. There is no pleural effusion. There is old deformity of the humeral neck on the right. Within the abdomen there are few loops of minimally distended gas and fluid-filled small bowel predominantly to the right of midline. There is some contrast and gas in the colon. There surgical clips in the gallbladder fossa. The nasogastric tubes proximal port lies at  the GE junction. The tip lies in the proximal gastric body. There are degenerative changes of the lumbar spine and both hips. There are numerous surgical clips in the pelvis. The urinary bladder contains contrasted urine.  IMPRESSION: 1. The bowel gas pattern is compatible with partial mid small bowel obstruction. CT contrast has traversed the small bowel and reached the colon however. 2. Advancement of the nasogastric tube by 5-10 cm is recommended to assure that the proximal port remains below the GE junction. 3. No acute cardiopulmonary abnormality. Probable chronic pulmonary fibrosis and COPD. Electronically Signed   By: David  Martinique M.D.   On: 01/30/2016 08:00   ASSESSMENT AND PLAN:  1. Accelerated hypertension. Likely worsened because of pain and nausea vomiting and unable to take her medications. Now much better controlled. Continue norvasc, coreg and lisinopril and can be D/C on same meds with same dose.   2. Partial small bowel obstruction: much better, NGT out, Tolerating CLD. Further mgmt per surgery.  3. Hypokalemia- Replete and recheck, d/w pharmacy who will manage electrolytes  4. Gastroesophageal reflux disease without esophagitis continue Protonix 5. History of arthritis 6. Follow-up results of urine culture: continue Rocephin, await urine c/s but can switch over to po keflex. Can stop abx in 3 days for simple UTI. 7. History of colon cancer     All the records are reviewed and case discussed with Care Management/Social Worker. Management plans discussed with the patient, family and they are in agreement.  CODE STATUS: FULL CODE  TOTAL TIME TAKING CARE OF THIS PATIENT: 15 minutes.   More than 50% of the time was spent in counseling/coordination of care: YES  Will sign off, please call with any questions. D/w Dr Bea Graff who is in agreement.   [facility], Duy Lemming M.D on 01/30/2016 at 5:28 PM  Between 7am to 6pm - Pager - [phone]  After 6pm go to [email or web address] - password [email or web address] [facility]  Office  [phone]  CC: Primary care physician; Rusty Aus., MD  Note: This dictation was prepared with Dragon dictation along with smaller phrase technology. Any  transcriptional errors that result from this process are unintentional.

## 2016-01-30 NOTE — Progress Notes (Signed)
CC: Partial SBO Subjective: Feeling much better. No abdominal pain. Colostomy started to work and there is some gas in the colostomy. Calcium is low and is being replaced creatinine mild increased we will keep an eye on this likely related to IV contrast from the CT scan Abdominal x-rays personal review there is actually contrast within the colon and significant improvement in bowel dilation.  Objective: Vital signs in last 24 hours: Temp:  [97.7 F (36.5 C)-98.4 F (36.9 C)] 98.1 F (36.7 C) (02/28 0445) Pulse Rate:  [69-95] 69 (02/28 0445) Resp:  [13-26] 18 (02/28 0445) BP: (148-221)/(50-183) 148/50 mmHg (02/28 0445) SpO2:  [90 %-100 %] 94 % (02/28 0445) Weight:  [93.713 kg (206 lb 9.6 oz)] 93.713 kg (206 lb 9.6 oz) (02/27 1852) Last BM Date: 01/30/16  Intake/Output from previous day: 02/27 0701 - 02/28 0700 In: 863.3 [I.V.:803.3; NG/GT:60] Out: 250 [Emesis/NG output:250] Intake/Output this shift: Total I/O In: 278.3 [I.V.:278.3] Out: 50 [Stool:50]  Physical exam: No acute distress awake alert  Chest: Sinus rhythm, S1 and S2 no murmurs.  Abdomen: Soft and mildly distended no peritonitis no tenderness colostomy with stool Extremity: No edema, well-perfused   Lab Results: CBC   Recent Labs  01/29/16 0919 01/30/16 0503  WBC 7.2 7.1  HGB 14.1 11.8*  HCT 41.2 34.9*  PLT 163 142*   BMET  Recent Labs  01/29/16 0919 01/30/16 0503  NA 141 143  K 3.2* 3.3*  CL 109 112*  CO2 21* 25  GLUCOSE 126* 110*  BUN 16 18  CREATININE 1.00 1.11*  CALCIUM 7.5* 6.7*   PT/INR No results for input(s): LABPROT, INR in the last 72 hours. ABG No results for input(s): PHART, HCO3 in the last 72 hours.  Invalid input(s): PCO2, PO2  Studies/Results: Ct Abdomen Pelvis W Contrast  01/29/2016  CLINICAL DATA:  Nausea vomiting since 5 a.m. yesterday. History colostomy for colon cancer in 1990. EXAM: CT ABDOMEN AND PELVIS WITH CONTRAST TECHNIQUE: Multidetector CT imaging of the  abdomen and pelvis was performed using the standard protocol following bolus administration of intravenous contrast. CONTRAST:  153mL OMNIPAQUE IOHEXOL 300 MG/ML  SOLN COMPARISON:  None FINDINGS: Lower chest: Lung bases are clear. Hepatobiliary: Postcholecystectomy. Pneumobilia present in the LEFT and RIGHT hepatic lobe as well as the common hepatic duct. No biliary duct dilatation. Pancreas: Pancreas is normal. No ductal dilatation. No pancreatic inflammation. Spleen: Normal spleen Adrenals/urinary tract: Adrenal glands are normal. New the kidneys enhance symmetrically. Low-density lesion in the the RIGHT kidney is simple fluid attenuation. Bilateral extrarenal pelves. Ureters are trauma. No ureteral calculi. No bladder calculi. Stomach/Bowel: Oral contrast in non distended stomach. Duodenum and proximal small bowel are normal caliber and fill with oral contrast. There is poor progression of the oral contrast beyond the proximal small bowel. Dilated loops of small bowel distally measuring up to 4.6 cm with air-fluid levels. The most distal small bowel is collapsed leading up to the terminal ileum. No clear transition point abdomen identified. LEFT lower quadrant colostomy. There is extensive fat herniating into the colostomy sac. There is fluid within hernia sac peripherally. Difficult to tell if these fluid represent small-bowel or loculated fluid. Concern for ileus small bowel entering the peristomal hernia with obstruction. The colon is mildly decompressed. Vascular/Lymphatic: Abdominal aorta is normal caliber with atherosclerotic calcification. There is no retroperitoneal or periportal lymphadenopathy. No pelvic lymphadenopathy. Reproductive: Post hysterectomy. Other: No free fluid. Musculoskeletal: No aggressive osseous lesion. IMPRESSION: 1. Small bowel obstruction pattern. The obstruction is present  within the mid small bowel which is dilated and fluid-filled. The findings concerning for closed loop  obstruction with decompressed proximal and distal small bowel. The obstructing lesions is not clearly identified however there is a LEFT lower pole peristomal hernia which contains loops of small bowel versus loculated fluid which could be the source of obstruction. 2. No pneumatosis or portal venous gas. Pneumobilia presumably related to prior sphincterotomy. For Findings conveyed toPHILLIP STAFFORD on 01/29/2016  at12:07. Electronically Signed   By: Suzy Bouchard M.D.   On: 01/29/2016 12:09   Dg Abd Acute W/chest  01/30/2016  CLINICAL DATA:  Diffuse abdominal pain, decreased stoma output, vomiting, remote history of left colectomy for malignancy EXAM: DG ABDOMEN ACUTE W/ 1V CHEST COMPARISON:  Abdominal pelvic CT scan of January 29, 2016 FINDINGS: The lungs are adequately inflated. The interstitial markings are coarse. The heart is normal in size. There is soft tissue fullness in the right paratracheal region. There is no pleural effusion. There is old deformity of the humeral neck on the right. Within the abdomen there are few loops of minimally distended gas and fluid-filled small bowel predominantly to the right of midline. There is some contrast and gas in the colon. There surgical clips in the gallbladder fossa. The nasogastric tubes proximal port lies at the GE junction. The tip lies in the proximal gastric body. There are degenerative changes of the lumbar spine and both hips. There are numerous surgical clips in the pelvis. The urinary bladder contains contrasted urine. IMPRESSION: 1. The bowel gas pattern is compatible with partial mid small bowel obstruction. CT contrast has traversed the small bowel and reached the colon however. 2. Advancement of the nasogastric tube by 5-10 cm is recommended to assure that the proximal port remains below the GE junction. 3. No acute cardiopulmonary abnormality. Probable chronic pulmonary fibrosis and COPD. Electronically Signed   By: David  Martinique M.D.   On:  01/30/2016 08:00    Anti-infectives: Anti-infectives    Start     Dose/Rate Route Frequency Ordered Stop   01/29/16 1230  cefTRIAXone (ROCEPHIN) 1 g in dextrose 5 % 50 mL IVPB     1 g 100 mL/hr over 30 Minutes Intravenous  Once 01/29/16 1221 01/29/16 1312      Assessment/Plan: Partial small bowel obstruction with significant improvement now contrast reaches the colon. We will clamp NG tube and if tolerated may DC it. May start clear liquid diet later today. No need for surgical intervention at this time. Replace potassium  Caroleen Hamman, MD, [facility]  01/30/2016

## 2016-01-31 LAB — BASIC METABOLIC PANEL
Anion gap: 5 (ref 5–15)
BUN: 19 mg/dL (ref 6–20)
CALCIUM: 6.8 mg/dL — AB (ref 8.9–10.3)
CO2: 23 mmol/L (ref 22–32)
CREATININE: 0.94 mg/dL (ref 0.44–1.00)
Chloride: 113 mmol/L — ABNORMAL HIGH (ref 101–111)
GFR calc Af Amer: >60 mL/min (ref >60)
GFR, EST NON AFRICAN AMERICAN: 55 mL/min — AB (ref >60)
Glucose, Bld: 116 mg/dL — ABNORMAL HIGH (ref 65–99)
POTASSIUM: 3.6 mmol/L (ref 3.5–5.1)
SODIUM: 141 mmol/L (ref 135–145)

## 2016-01-31 LAB — URINE CULTURE

## 2016-01-31 LAB — MAGNESIUM: Magnesium: 2.5 mg/dL — ABNORMAL HIGH (ref 1.7–2.4)

## 2016-01-31 NOTE — Progress Notes (Signed)
MEDICATION RELATED CONSULT NOTE - INITIAL   Pharmacy Consult for Electrolyte Management Indication: hypokalemia  No Known Allergies  Patient Measurements: Height: 5\' 5"  (165.1 cm) Weight: 206 lb 9.6 oz (93.713 kg) IBW/kg (Calculated) : 57 Adjusted Body Weight: na  Vital Signs: Temp: 97.7 F (36.5 C) (03/01 0549) Temp Source: Oral (03/01 0549) BP: 154/56 mmHg (03/01 0549) Pulse Rate: 61 (03/01 0549) Intake/Output from previous day: 02/28 0701 - 03/01 0700 In: 2364.7 [P.O.:480; I.V.:1834.7; IV Piggyback:50] Out: 275 [Urine:100; Stool:175] Intake/Output from this shift: Total I/O In: 806.3 [I.V.:756.3; IV Piggyback:50] Out: 75 [Stool:75]  Labs:  Recent Labs  01/29/16 0919 01/30/16 0503 01/31/16 0556  WBC 7.2 7.1  --   HGB 14.1 11.8*  --   HCT 41.2 34.9*  --   PLT 163 142*  --   CREATININE 1.00 1.11* 0.94  MG  --  1.0* 2.5*  ALBUMIN 3.2*  --   --   PROT 6.2*  --   --   AST 20  --   --   ALT 10*  --   --   ALKPHOS 53  --   --   BILITOT 1.1  --   --    Estimated Creatinine Clearance: 51.3 mL/min (by C-G formula based on Cr of 0.94).   Microbiology: Recent Results (from the past 720 hour(s))  Urine culture     Status: None (Preliminary result)   Collection Time: 01/29/16 12:21 PM  Result Value Ref Range Status   Specimen Description URINE, RANDOM  Final   Special Requests NONE  Final   Culture TOO YOUNG TO READ  Final   Report Status PENDING  Incomplete    Medical History: Past Medical History  Diagnosis Date  . Hypertension   . Hypercalcemia   . Malignant neoplasm (HCC)     of descending colon  . Colon cancer ([facility])   . Arthritis     Medications:  Scheduled:  . amLODipine  10 mg Oral Daily  . aspirin EC  81 mg Oral Daily  . carvedilol  50 mg Oral BID  . ciprofloxacin  500 mg Oral BID  . enoxaparin (LOVENOX) injection  40 mg Subcutaneous Q24H  . lisinopril  20 mg Oral Daily  . pantoprazole (PROTONIX) IV  40 mg Intravenous QHS   Infusions:  .  dextrose 5 % and 0.9 % NaCl with KCl 20 mEq/L 100 mL/hr at 01/31/16 0155    Assessment: Patient is a 82yo female admitted for SBO with emesis. Pharmacy consulted for electrolyte management.  K=3.3 Mag=1.0  Patient received KCl 25mEq IV x 2 this morning and currently has an IV fluid of D5NS w/20 mEq of KCl running at 117ml/hr.  Goal of Therapy:  Maintain electrolytes WNL.  Plan:  No need for additional electrolyte supplementation at this point. Will recheck with AM labs.  Nafis Farnan A. [place], [place].D., BCPS Clinical Pharmacist  01/31/2016 6:53 AM

## 2016-01-31 NOTE — Discharge Summary (Signed)
Patient ID: [name] MRN: [id number] DOB/AGE: 07/03/1930 82 y.o.  Admit date: 01/29/2016 Discharge date: 01/31/2016   Discharge Diagnoses:  Active Problems:   SBO (small bowel obstruction) [facility])   Procedures:None   Hospital Course: 82 year old female admitted for small bowel obstruction. She had a history of colectomy in the past with an end colostomy. Presented with small bowel obstruction questionable due to a parastomal hernia. She was given a trial with nothing by mouth NG tube and IV fluids. She responded very well to medical treatment and we were able to remove her NG tube as well as to advance her diet. Her hypertensive crisis also resolved. At time of discharge she was tolerating a regular diet, having bowel movements, passing gas, her abdomen was soft nontender and a large left parastomal hernia with uptake and patent colostomy back. Discussed with her the options given that this is a limited episode of bowel obstruction I do not necessarily recommend repair this parastomal hernia given her comorbidities and her age I also explained to her about the not so great results of fixing this complex parastomal hernias especially at her age. At this point she wants to watch and wait, if anything changes she will think about possible intervention. Extensive counseling provided   Disposition: 01-Home or Self Care  Discharge Instructions    Call MD for:  difficulty breathing, headache or visual disturbances    Complete by:  As directed      Call MD for:  persistant nausea and vomiting    Complete by:  As directed      Call MD for:  severe uncontrolled pain    Complete by:  As directed      Call MD for:  temperature >100.4    Complete by:  As directed      Diet - low sodium heart healthy    Complete by:  As directed      Increase activity slowly    Complete by:  As directed      No wound care    Complete by:  As directed             Medication List    TAKE these  medications        amitriptyline 10 MG tablet  Commonly known as:  ELAVIL  Take 1 tablet by mouth daily.     amLODipine 10 MG tablet  Commonly known as:  NORVASC  Take 10 mg by mouth daily.     aspirin EC 81 MG tablet  Take 81 mg by mouth daily.     carvedilol 25 MG tablet  Commonly known as:  COREG  Take 50 mg by mouth 2 (two) times daily.     cholecalciferol 1000 units tablet  Commonly known as:  VITAMIN D  Take 1,000 Units by mouth daily.     furosemide 20 MG tablet  Commonly known as:  LASIX  Take 20 mg by mouth daily.     ibuprofen 200 MG tablet  Commonly known as:  ADVIL,MOTRIN  Take 200 mg by mouth every 6 (six) hours as needed. For pain.     lisinopril 40 MG tablet  Commonly known as:  PRINIVIL,ZESTRIL  Take 1 tablet (40 mg total) by mouth daily.     pantoprazole 40 MG tablet  Commonly known as:  PROTONIX  Take 40 mg by mouth every morning.     RA VITAMIN B-12 TR 1000 MCG Tbcr  Generic drug:  Cyanocobalamin  Take 1,000  mcg by mouth daily.     traMADol 50 MG tablet  Commonly known as:  ULTRAM  Take 50 mg by mouth every 8 (eight) hours as needed. For pain.     triamcinolone cream 0.1 %  Commonly known as:  KENALOG  Apply 1 application topically 2 (two) times daily.           Follow-up Information    Follow up with Jules Husbands, MD. Go on 02/14/2016.   Specialty:  General Surgery   Why:  @2 :15 IN [facility] information:   [address] [place]  [address] [phone]        Caroleen Hamman, MD FACS

## 2016-01-31 NOTE — Progress Notes (Signed)
Pt stable. IV removed. D/c instructions given and education provided. Pt states she understands instructions. Pt dressed and escorted out by staff. Driven home by family.

## 2016-01-31 NOTE — Progress Notes (Signed)
Notified Dr. Dahlia Byes concerning discharge summary medication list. MD starting case and will verify and adjust accordingly the discharge medication record.

## 2016-01-31 NOTE — Discharge Summary (Deleted)
Patient ID: [name] MRN: [id number] DOB/AGE: 05/03/1930 82 y.o.  Admit date: 01/29/2016 Discharge date: 01/31/2016  Discharge Diagnoses:  SBO  Procedures Performed: none  Discharged Condition: stable  Hospital Course: 82 year old female admitted for small bowel obstruction. She had a history of colectomy in the past with an end colostomy. Presented with small bowel obstruction questionable due to a parastomal hernia. She was given a trial with nothing by mouth NG tube and IV fluids. She responded very well to medical treatment and we were able to remove her NG tube as well as to advance her diet. Her hypertensive crisis also resolved. At time of discharge she was tolerating a regular diet, having bowel movements, passing gas, her abdomen was soft nontender and a large left parastomal hernia with uptake and patent colostomy back. Discussed with her the options given that this is a limited episode of bowel obstruction I do not necessarily recommend repair this parastomal hernia given her comorbidities and her age I also explained to her about the not so great results of fixing this complex parastomal hernias especially at her age. At this point she wants to watch and wait and evading changes she will [name] think about possible intervention. Extensive counseling provided  Discharge Orders: Discharge Instructions    Call MD for:  difficulty breathing, headache or visual disturbances    Complete by:  As directed      Call MD for:  persistant nausea and vomiting    Complete by:  As directed      Call MD for:  severe uncontrolled pain    Complete by:  As directed      Call MD for:  temperature >100.4    Complete by:  As directed      Diet - low sodium heart healthy    Complete by:  As directed      Increase activity slowly    Complete by:  As directed      No wound care    Complete by:  As directed            Disposition: 01-Home or Self Care  Discharge Medications:  Current  facility-administered medications:  .  amLODipine (NORVASC) tablet 10 mg, 10 mg, Oral, Daily, Bronc Brosseau F Larwence Tu, MD, 10 mg at 01/31/16 0955 .  aspirin EC tablet 81 mg, 81 mg, Oral, Daily, Jules Husbands, MD, 81 mg at 01/31/16 0956 .  carvedilol (COREG) tablet 50 mg, 50 mg, Oral, BID, Amil Moseman F Rajah Tagliaferro, MD, 50 mg at 01/31/16 0955 .  ciprofloxacin (CIPRO) tablet 500 mg, 500 mg, Oral, BID, Baylin Gamblin F Lakyia Behe, MD, 500 mg at 01/31/16 0955 .  enoxaparin (LOVENOX) injection 40 mg, 40 mg, Subcutaneous, Q24H, Aleece Loyd F Koah Chisenhall, MD, 40 mg at 01/30/16 2049 .  hydrALAZINE (APRESOLINE) injection 10 mg, 10 mg, Intravenous, Q2H PRN, Selicia Windom F Mort Smelser, MD .  lisinopril (PRINIVIL,ZESTRIL) tablet 20 mg, 20 mg, Oral, Daily, Sandrea Boer F Quinto Tippy, MD, 20 mg at 01/31/16 0955 .  morphine 2 MG/ML injection 2 mg, 2 mg, Intravenous, Q2H PRN, Ellissa Ayo F Leul Narramore, MD .  ondansetron (ZOFRAN-ODT) disintegrating tablet 4 mg, 4 mg, Oral, Q6H PRN **OR** ondansetron (ZOFRAN) injection 4 mg, 4 mg, Intravenous, Q6H PRN, Decklan Mau F Dasean Brow, MD .  pantoprazole (PROTONIX) injection 40 mg, 40 mg, Intravenous, QHS, Aaron Boeh F Benard Minturn, MD, 40 mg at 01/30/16 2141  Follwup: Follow-up Information    Follow up In 2 weeks.      Signed: [place] 01/31/2016, 11:03 AM  Caroleen Hamman, MD FACS

## 2016-01-31 NOTE — Discharge Instructions (Signed)
Intestinal Pseudo-Obstruction °Intestinal pseudo-obstruction is a condition that causes symptoms of a blockage in your intestinal tract without an actual obstruction. Your intestinal tract is made up of the hollow organs that digest your food after the food leaves your stomach. °Most digestion takes place in the upper part of the intestines (small intestine). Undigested food leaves your small intestine and passes into the lower part (large intestine). There, water is absorbed and bowel movements are formed. Intestinal pseudo-obstruction can take place anywhere along this tract. Intestinal pseudo-obstruction can be a short-term problem (acute) or a long-term (chronic) disease. °CAUSES  °Causes of intestinal pseudo-obstruction are categorized as primary or secondary: °· Primary causes are abnormalities in the nerves and muscles that move food through the intestines. These abnormalities can be caused by gene defects (genetic mutations) that are passed down through families (inherited). °· Secondary causes are problems resulting from other diseases or treatments that may affect the intestinal tract. These include: °¨ Muscle and nervous system diseases. °¨ Infections. °¨ Cancer or cancer treatments. °¨ Medicines. °¨ Surgery. °In some cases, the cause of intestinal pseudo-obstruction cannot be found (idiopathic).  °SIGNS AND SYMPTOMS  °Symptoms can vary from person to person. The symptoms depend on the cause of the pseudo-obstruction and whether it is acute or chronic. Symptoms may include: °· Abdominal pain, swelling, or bloating. °· Nausea and vomiting. °· Constipation or diarrhea. °· Poor nutrition and weight loss. °DIAGNOSIS  °Intestinal pseudo-obstruction can be hard to diagnose because the condition can have many causes. Symptoms can also be similar to the symptoms of many other diseases. Your health care provider will ask about your medical history and do a physical exam. Most people need to have several exams. You  may also need to see a health care provider who specializes in the digestive tract (gastroenterologist). Tests may be done to confirm the diagnosis. These may include: °· Blood tests. °· Exams to look into the small intestine or the large intestine (endoscopy or colonoscopy). °· Taking an X-ray of the digestive tract after a substance has been swallowed that is easy to see on an X-ray (barium study). °· CT scan of the digestive tract. °· Placing a tube into the intestine to measure pressure (manometry). °· Removing a small piece of tissue from the intestinal wall to be examined under a microscope (biopsy). °TREATMENT  °Treatment depends on the cause of your pseudo-obstruction. For instance, you may need to be treated for another disease. You might need to change a medicine you are taking. If your pseudo-obstruction is caused by an infection, you may be given antibiotic medicine. Other possible treatments include: °· Placing a tube (colonoscope) into the large intestine to remove gas. °· Placing a tube (nasogastric tube) through the nose and down into the small intestine to remove gas or fluids. °· Placing a tube (feeding tube) into the stomach or small intestine for liquid nutrition. °· Medicines for: °¨ Pain. °¨ Nausea. °¨ Diarrhea. °¨ Constipation. °¨ Stimulating intestinal muscle movements. °· Surgery to remove part of the intestine. This is rare and only needed for severe cases not helped by other treatments. °HOME CARE INSTRUCTIONS °· Take medicines only as directed by your health care provider. °· If you were prescribed an antibiotic medicine, finish it all even if you start to feel better. °· Make any changes to your diet or eating habits recommended by your health care provider. This might include: °¨ Eating smaller meals more often. Try having five or six smaller meals each day   instead of three large meals. °¨ Eating pureed foods or liquid food supplements. This may ease symptoms. °¨ Taking vitamin and  mineral supplements to help prevent poor nutrition. Ask your health care provider to recommend a multivitamin. °· Keep all follow-up visits as directed by your health care provider. This is important. °SEEK MEDICAL CARE IF: °Your symptoms do not go away or get worse. °MAKE SURE YOU: °· Understand these instructions. °· Will watch your condition. °· Will get help right away if you are not doing well or get worse. °  °This information is not intended to replace advice given to you by your health care provider. Make sure you discuss any questions you have with your health care provider. °  °Document Released: 09/15/2009 Document Revised: 12/09/2014 Document Reviewed: 02/23/2014 °Elsevier Interactive Patient Education ©2016 [employer] ° °

## 2016-01-31 NOTE — Care Management Important Message (Signed)
Important Message  Patient Details  Name: [name] MRN: [id number] Date of Birth: Apr 26, 1930   Medicare Important Message Given:  Yes    [name] 01/31/2016, 10:11 AM

## 2016-02-14 ENCOUNTER — Ambulatory Visit (INDEPENDENT_AMBULATORY_CARE_PROVIDER_SITE_OTHER): Payer: Medicare Other | Admitting: Surgery

## 2016-02-14 ENCOUNTER — Encounter: Payer: Self-pay | Admitting: Surgery

## 2016-02-14 VITALS — BP 159/85 | HR 76 | Temp 98.1°F | Ht 65.0 in | Wt 206.0 lb

## 2016-02-14 DIAGNOSIS — K435 Parastomal hernia without obstruction or  gangrene: Secondary | ICD-10-CM

## 2016-02-14 NOTE — Progress Notes (Signed)
82 year old following up for mall bowel obstruction secondary to parastomal hernia. She was admitted recently to the hospital and responded to medical management. She currently is symptom free and is tolerating regular diet and her colostomy is working and no abdominal pain  ROS : negative otherwise  PE: NAD in a wheelchair, debilitated but in good spirits ABd: obese w panus and end colostomy, w parastomal hernia, no incarceration. Colostomy pink and patent Ext: well perfused , no edema, warm to touch.   A/P early female with parastomal hernia discussed with her in detail about possible options at this time I'd on this early think she is a good surgical candidate given her debility and the fact that doing a parastomal hernia repair has significant chance of recurrence. I explained to her in detail and she understands if she recurrent bouts of obstructions we may change therapy and we may be pushed for surgical intervention.

## 2016-02-14 NOTE — Patient Instructions (Signed)
Please call with any questions or concerns.

## 2016-03-18 ENCOUNTER — Inpatient Hospital Stay
Admission: EM | Admit: 2016-03-18 | Discharge: 2016-03-23 | DRG: 394 | Disposition: A | Discharge status: Home / Self Care | Payer: Medicare Other | Attending: Surgery | Admitting: Surgery

## 2016-03-18 ENCOUNTER — Encounter: Payer: Self-pay | Admitting: Emergency Medicine

## 2016-03-18 ENCOUNTER — Emergency Department: Payer: Medicare Other

## 2016-03-18 DIAGNOSIS — Z933 Colostomy status: Secondary | ICD-10-CM

## 2016-03-18 DIAGNOSIS — I10 Essential (primary) hypertension: Secondary | ICD-10-CM | POA: Diagnosis present

## 2016-03-18 DIAGNOSIS — Z85038 Personal history of other malignant neoplasm of large intestine: Secondary | ICD-10-CM

## 2016-03-18 DIAGNOSIS — K565 Intestinal adhesions [bands], unspecified as to partial versus complete obstruction: Secondary | ICD-10-CM

## 2016-03-18 DIAGNOSIS — Z7982 Long term (current) use of aspirin: Secondary | ICD-10-CM

## 2016-03-18 DIAGNOSIS — N390 Urinary tract infection, site not specified: Secondary | ICD-10-CM

## 2016-03-18 DIAGNOSIS — K56609 Unspecified intestinal obstruction, unspecified as to partial versus complete obstruction: Secondary | ICD-10-CM | POA: Diagnosis present

## 2016-03-18 DIAGNOSIS — Z79899 Other long term (current) drug therapy: Secondary | ICD-10-CM

## 2016-03-18 DIAGNOSIS — K433 Parastomal hernia with obstruction, without gangrene: Secondary | ICD-10-CM | POA: Diagnosis not present

## 2016-03-18 DIAGNOSIS — Z9849 Cataract extraction status, unspecified eye: Secondary | ICD-10-CM

## 2016-03-18 DIAGNOSIS — Z9049 Acquired absence of other specified parts of digestive tract: Secondary | ICD-10-CM

## 2016-03-18 DIAGNOSIS — Z811 Family history of alcohol abuse and dependence: Secondary | ICD-10-CM

## 2016-03-18 DIAGNOSIS — C189 Malignant neoplasm of colon, unspecified: Secondary | ICD-10-CM | POA: Diagnosis present

## 2016-03-18 DIAGNOSIS — N289 Disorder of kidney and ureter, unspecified: Secondary | ICD-10-CM

## 2016-03-18 DIAGNOSIS — Z8249 Family history of ischemic heart disease and other diseases of the circulatory system: Secondary | ICD-10-CM

## 2016-03-18 DIAGNOSIS — N179 Acute kidney failure, unspecified: Secondary | ICD-10-CM | POA: Diagnosis present

## 2016-03-18 DIAGNOSIS — M1990 Unspecified osteoarthritis, unspecified site: Secondary | ICD-10-CM | POA: Diagnosis present

## 2016-03-18 DIAGNOSIS — E876 Hypokalemia: Secondary | ICD-10-CM | POA: Diagnosis present

## 2016-03-18 DIAGNOSIS — Z818 Family history of other mental and behavioral disorders: Secondary | ICD-10-CM

## 2016-03-18 DIAGNOSIS — Z803 Family history of malignant neoplasm of breast: Secondary | ICD-10-CM

## 2016-03-18 LAB — COMPREHENSIVE METABOLIC PANEL
ALK PHOS: 45 U/L (ref 38–126)
ALT: 8 U/L — ABNORMAL LOW (ref 14–54)
AST: 16 U/L (ref 15–41)
Albumin: 2.9 g/dL — ABNORMAL LOW (ref 3.5–5.0)
Anion gap: 13 (ref 5–15)
BUN: 32 mg/dL — ABNORMAL HIGH (ref 6–20)
CALCIUM: 7.5 mg/dL — AB (ref 8.9–10.3)
CHLORIDE: 102 mmol/L (ref 101–111)
CO2: 17 mmol/L — AB (ref 22–32)
CREATININE: 2.3 mg/dL — AB (ref 0.44–1.00)
GFR calc non Af Amer: 19 mL/min — ABNORMAL LOW (ref >60)
GFR, EST AFRICAN AMERICAN: 21 mL/min — AB (ref >60)
GLUCOSE: 104 mg/dL — AB (ref 65–99)
Potassium: 3.5 mmol/L (ref 3.5–5.1)
Sodium: 132 mmol/L — ABNORMAL LOW (ref 135–145)
Total Bilirubin: 1.4 mg/dL — ABNORMAL HIGH (ref 0.3–1.2)
Total Protein: 6.4 g/dL — ABNORMAL LOW (ref 6.5–8.1)

## 2016-03-18 LAB — CBC WITH DIFFERENTIAL/PLATELET
BASOS PCT: 1 %
Basophils Absolute: 0.1 10*3/uL (ref 0–0.1)
Eosinophils Absolute: 0.1 10*3/uL (ref 0–0.7)
Eosinophils Relative: 1 %
HEMATOCRIT: 34.7 % — AB (ref 35.0–47.0)
Hemoglobin: 11.8 g/dL — ABNORMAL LOW (ref 12.0–16.0)
LYMPHS ABS: 0.7 10*3/uL — AB (ref 1.0–3.6)
LYMPHS PCT: 9 %
MCH: 30.2 pg (ref 26.0–34.0)
MCHC: 34.1 g/dL (ref 32.0–36.0)
MCV: 88.7 fL (ref 80.0–100.0)
MONO ABS: 0.7 10*3/uL (ref 0.2–0.9)
MONOS PCT: 9 %
NEUTROS ABS: 6.3 10*3/uL (ref 1.4–6.5)
Neutrophils Relative %: 80 %
Platelets: 199 10*3/uL (ref 150–440)
RBC: 3.91 MIL/uL (ref 3.80–5.20)
RDW: 12.9 % (ref 11.5–14.5)
WBC: 8 10*3/uL (ref 3.6–11.0)

## 2016-03-18 LAB — URINALYSIS COMPLETE WITH MICROSCOPIC (ARMC ONLY)
BACTERIA UA: NONE SEEN
Bilirubin Urine: NEGATIVE
Glucose, UA: NEGATIVE mg/dL
NITRITE: NEGATIVE
PH: 5 (ref 5.0–8.0)
PROTEIN: 100 mg/dL — AB
SPECIFIC GRAVITY, URINE: 1.01 (ref 1.005–1.030)
Squamous Epithelial / LPF: NONE SEEN

## 2016-03-18 LAB — LIPASE, BLOOD: Lipase: 16 U/L (ref 11–51)

## 2016-03-18 LAB — POCT PREGNANCY, URINE: PREG TEST UR: NEGATIVE

## 2016-03-18 MED ORDER — ONDANSETRON HCL 4 MG/2ML IJ SOLN
4.0000 mg | Freq: Four times a day (QID) | INTRAMUSCULAR | Status: DC | PRN
  Administered 2016-03-19 (×2): 4 mg via INTRAVENOUS
  Filled 2016-03-18 (×2): qty 2

## 2016-03-18 MED ORDER — DIPHENHYDRAMINE HCL 50 MG/ML IJ SOLN: 12.5000 mg | Freq: Four times a day (QID) | INTRAMUSCULAR | Status: DC | PRN

## 2016-03-18 MED ORDER — LACTATED RINGERS IV SOLN
INTRAVENOUS | Status: DC
  Administered 2016-03-18 – 2016-03-20 (×5): via INTRAVENOUS

## 2016-03-18 MED ORDER — MORPHINE SULFATE (PF) 2 MG/ML IV SOLN
2.0000 mg | INTRAVENOUS | Status: DC | PRN
  Administered 2016-03-18 – 2016-03-19 (×2): 2 mg via INTRAVENOUS
  Filled 2016-03-18 (×2): qty 1

## 2016-03-18 MED ORDER — ONDANSETRON HCL 4 MG/2ML IJ SOLN
4.0000 mg | Freq: Once | INTRAMUSCULAR | Status: AC
  Administered 2016-03-18: 4 mg via INTRAVENOUS
  Filled 2016-03-18: qty 2

## 2016-03-18 MED ORDER — AMITRIPTYLINE HCL 10 MG PO TABS
10.0000 mg | ORAL_TABLET | Freq: Every day | ORAL | Status: DC
  Administered 2016-03-19 – 2016-03-22 (×4): 10 mg via ORAL
  Filled 2016-03-18 (×6): qty 1

## 2016-03-18 MED ORDER — DEXTROSE 5 % IV SOLN
1.0000 g | Freq: Once | INTRAVENOUS | Status: AC
  Administered 2016-03-18: 18:00:00 via INTRAVENOUS
  Filled 2016-03-18: qty 10

## 2016-03-18 MED ORDER — ONDANSETRON 4 MG PO TBDP
4.0000 mg | ORAL_TABLET | Freq: Four times a day (QID) | ORAL | Status: DC | PRN
  Administered 2016-03-18: 4 mg via ORAL
  Filled 2016-03-18 (×2): qty 1

## 2016-03-18 MED ORDER — DIPHENHYDRAMINE HCL 12.5 MG/5ML PO ELIX: 12.5000 mg | ORAL_SOLUTION | Freq: Four times a day (QID) | ORAL | Status: DC | PRN

## 2016-03-18 MED ORDER — AMLODIPINE BESYLATE 10 MG PO TABS
10.0000 mg | ORAL_TABLET | Freq: Every day | ORAL | Status: DC
  Administered 2016-03-20 – 2016-03-23 (×4): 10 mg via ORAL
  Filled 2016-03-18 (×4): qty 1

## 2016-03-18 MED ORDER — LISINOPRIL 20 MG PO TABS
20.0000 mg | ORAL_TABLET | Freq: Every day | ORAL | Status: DC
  Administered 2016-03-20: 20 mg via ORAL
  Filled 2016-03-18: qty 1

## 2016-03-18 MED ORDER — DIATRIZOATE MEGLUMINE & SODIUM 66-10 % PO SOLN
15.0000 mL | ORAL | Status: AC
  Administered 2016-03-18 (×2): 15 mL via ORAL
  Filled 2016-03-18: qty 30

## 2016-03-18 MED ORDER — SODIUM CHLORIDE 0.9 % IV BOLUS (SEPSIS)
500.0000 mL | Freq: Once | INTRAVENOUS | Status: AC
  Administered 2016-03-18: 500 mL via INTRAVENOUS

## 2016-03-18 MED ORDER — HYDRALAZINE HCL 20 MG/ML IJ SOLN
10.0000 mg | INTRAMUSCULAR | Status: DC | PRN
Start: 2016-03-18 — End: 2016-03-22

## 2016-03-18 MED ORDER — HYDROMORPHONE HCL 1 MG/ML IJ SOLN
1.0000 mg | Freq: Once | INTRAMUSCULAR | Status: AC
  Administered 2016-03-18: 1 mg via INTRAVENOUS
  Filled 2016-03-18: qty 1

## 2016-03-18 MED ORDER — DEXTROSE 5 % IV SOLN
1.0000 g | Freq: Every day | INTRAVENOUS | Status: DC
  Administered 2016-03-18 – 2016-03-20 (×3): 1 g via INTRAVENOUS
  Filled 2016-03-18 (×4): qty 10

## 2016-03-18 MED ORDER — CARVEDILOL 12.5 MG PO TABS
37.5000 mg | ORAL_TABLET | Freq: Two times a day (BID) | ORAL | Status: DC
  Administered 2016-03-19 – 2016-03-20 (×2): 37.5 mg via ORAL
  Filled 2016-03-18 (×2): qty 3

## 2016-03-18 MED ORDER — PANTOPRAZOLE SODIUM 40 MG IV SOLR
40.0000 mg | Freq: Every day | INTRAVENOUS | Status: DC
  Administered 2016-03-18 – 2016-03-21 (×4): 40 mg via INTRAVENOUS
  Filled 2016-03-18 (×4): qty 40

## 2016-03-18 NOTE — ED Notes (Signed)
Pt has no pain.  nsr on monitor.  Family with pt.

## 2016-03-18 NOTE — ED Provider Notes (Signed)
Time Seen: Approximately *1500  I have reviewed the triage notes  Chief Complaint: Abdominal Pain   History of Present Illness: [name] is a 82 y.o. female who presents with symptoms consistent with her previous bowel obstruction. Patient was here 3 weeks ago and was admitted for a bowel obstruction with an NG tube and did not require any operative management. She has a colostomy. She states she started having some discomfort and decreased output from her colostomy stat site starting yesterday had approximately noon. She states she's had multiple episodes of nausea and vomiting with some mild abdominal distention. She denies any fever at home or urinary complaints.   Past Medical History  Diagnosis Date  . Hypertension   . Hypercalcemia   . Malignant neoplasm (HCC)     of descending colon  . Colon cancer ([place])   . Arthritis     Patient Active Problem List   Diagnosis Date Noted  . SBO (small bowel obstruction) ([place]) 01/29/2016  . Hypokalemia 10/21/2015  . Malignant hypertension 10/21/2015  . Weakness 10/21/2015    Past Surgical History  Procedure Laterality Date  . Colostomy    . Cholecystectomy    . Cataracts    . Colon cancer surgery      Past Surgical History  Procedure Laterality Date  . Colostomy    . Cholecystectomy    . Cataracts    . Colon cancer surgery      Current Outpatient Rx  Name  Route  Sig  Dispense  Refill  . amitriptyline (ELAVIL) 10 MG tablet   Oral   Take 1 tablet by mouth daily.      1   . amLODipine (NORVASC) 10 MG tablet   Oral   Take 10 mg by mouth daily.      0   . aspirin EC 81 MG tablet   Oral   Take 81 mg by mouth daily.         . carvedilol (COREG) 25 MG tablet   Oral   Take 50 mg by mouth 2 (two) times daily.       0   . cholecalciferol (VITAMIN D) 1000 units tablet   Oral   Take 1,000 Units by mouth daily.         . Cyanocobalamin (RA VITAMIN B-12 TR) 1000 MCG TBCR   Oral   Take 1,000 mcg by  mouth daily.         . furosemide (LASIX) 20 MG tablet   Oral   Take 20 mg by mouth daily.      0   . ibuprofen (ADVIL,MOTRIN) 200 MG tablet   Oral   Take 200 mg by mouth every 6 (six) hours as needed. For pain.         Marland Kitchen lisinopril (PRINIVIL,ZESTRIL) 40 MG tablet   Oral   Take 1 tablet (40 mg total) by mouth daily. Patient taking differently: Take 20 mg by mouth daily.    60 tablet   1   . pantoprazole (PROTONIX) 40 MG tablet   Oral   Take 40 mg by mouth every morning.      0   . traMADol (ULTRAM) 50 MG tablet   Oral   Take 50 mg by mouth every 8 (eight) hours as needed. For pain.      0   . triamcinolone cream (KENALOG) 0.1 %   Topical   Apply 1 application topically 2 (two) times daily.  Allergies:  Review of patient's allergies indicates no known allergies.  Family History: Family History  Problem Relation Age of Onset  . CAD Brother   . Congestive Heart Failure Mother   . Breast cancer Daughter   . Alcohol abuse Son     Social History: Social History  Substance Use Topics  . Smoking status: Never Smoker   . Smokeless tobacco: None  . Alcohol Use: No     Review of Systems:   10 point review of systems was performed and was otherwise negative:  Constitutional: No fever Eyes: No visual disturbances ENT: No sore throat, ear pain Cardiac: No chest pain Respiratory: No shortness of breath, wheezing, or stridor Abdomen: Diffuse abdominal pain mainly around her colostomy site. Endocrine: No weight loss, No night sweats Extremities: No peripheral edema, cyanosis Skin: No rashes, easy bruising Neurologic: No focal weakness, trouble with speech or swollowing Urologic: No dysuria, Hematuria, or urinary frequency   Physical Exam:  ED Triage Vitals  Enc Vitals Group     BP 03/18/16 1459 175/139 mmHg     Pulse Rate 03/18/16 1459 77     Resp 03/18/16 1459 20     Temp 03/18/16 1459 98.7 F (37.1 C)     Temp Source 03/18/16 1459  Oral     SpO2 03/18/16 1459 100 %     Weight 03/18/16 1459 205 lb (92.987 kg)     Height 03/18/16 1459 5\' 5"  (1.651 m)     Head Cir --      Peak Flow --      Pain Score 03/18/16 1451 7     Pain Loc --      Pain Edu? --      Excl. in [place]? --     General: Awake , Alert , and Oriented times 3; GCS 15 Head: Normal cephalic , atraumatic Eyes: Pupils equal , round, reactive to light Nose/Throat: No nasal drainage, patent upper airway without erythema or exudate.  Neck: Supple, Full range of motion, No anterior adenopathy or palpable thyroid masses Lungs: Clear to ascultation without wheezes , rhonchi, or rales Heart: Regular rate, regular rhythm without murmurs , gallops , or rubs Abdomen: Abdomen slightly distended with tenderness toward the left lower and left upper quadrant. Her colostomy bag has a small amount of fluid in it but no stool. His are diminished in all 4 quadrants. No palpable masses. No obvious focal reproducible right-sided abdominal pain.   Extremities: Bilateral peripheral edema left greater than the right Neurologic: normal ambulation, Motor symmetric without deficits, sensory intact Skin: warm, dry, no rashes   Labs:   All laboratory work was reviewed including any pertinent negatives or positives listed below:  Labs Reviewed  CBC WITH DIFFERENTIAL/PLATELET  COMPREHENSIVE METABOLIC PANEL  LIPASE, BLOOD  Laboratory work reviewed shows renal insufficiency   Radiology: EXAM: CT ABDOMEN AND PELVIS WITHOUT CONTRAST  TECHNIQUE: Multidetector CT imaging of the abdomen and pelvis was performed following the standard protocol without IV contrast.  COMPARISON: 01/29/2016  FINDINGS: Lung bases demonstrate mild scarring without focal infiltrate or sizable effusion.  The gallbladder is been surgically removed. Air is noted in the biliary tree consistent with prior sphincterotomy. The spleen, adrenal glands and pancreas are within normal limits.  Kidneys demonstrate cystic changes bilaterally. There is fullness of the collecting systems and right greater than left without definitive obstructive lesion. These changes have progressed somewhat in the interval from the prior exam. Correlation laboratory values is recommended.  There are multiple  dilated loops of small bowel which extends to the level of the left lower quadrant ostomy at which point there is herniation of small bowel into the ostomy adjacent to the colon. This is the etiology of the patient's underlying obstructive change. The more distal small bowel is decompressed. The visualized portions of the colon show no acute abnormality. Some herniation of the colon into the ostomy site is noted as well. Some fluid is also noted within the ostomy site.  The appendix is within normal limits. The bladder is partially distended. The uterus is stable in appearance. The osseous structures show degenerative change of the lumbar spine. No acute abnormality is noted.  IMPRESSION: Changes consistent with proximal small bowel obstruction secondary to incarceration of small bowel within the left lower quadrant ostomy site. The more distal small bowel is within normal limits.  Increased fullness in the collecting systems of the kidneys bilaterally worse on the right than the left. No definitive obstructing lesion is seen. Correlation laboratory values is recommended.  Postsurgical changes as described.       I personally reviewed the radiologic studies     ED Course: Patient per CT appears to have a bowel obstruction. Along with her bowel obstructions appears to have some renal insufficiency and a urinary tract infection. Patient was started on urine culture and was given IV antibiotics here in emergency department. General surgery was consulted for care of the bowel obstruction and she received an IV fluid bolus for her renal insufficiency here in emergency department.  She is otherwise hemodynamically stable and seemed to be more comfortable with the IV pain medication and anti-nausea therapy    Assessment: * Small bowel obstruction Urinary tract infection Renal insufficiency      Plan: Inpatient management            Daymon Larsen, MD 03/19/16 0045

## 2016-03-18 NOTE — ED Notes (Signed)
Pt drinking po contrast.  Pt alert.  Pt ahs nausea and abd pain.  Family with pt.

## 2016-03-18 NOTE — H&P (Signed)
Patient ID: [name], female   DOB: [birth year], 82 y.o.   MRN: [id number]  CC: Bowel obstruction  HPI [name] is a 82 y.o. female who is well-known to the surgery service returns emergency department with complaints of nausea, vomiting, abdominal pain consistent with her previous bowel obstructions. Patient states that she was admitted to the hospital last month with nearly identical symptoms that required an NG tube. She states this time it is not as severe, however she had numerous bouts of nausea and vomiting overnight. Her last episode of vomiting was approximately 10 hours before this H&P. She denies any fevers, chills, chest pain, shortness of breath, dysuria, malaise. She did note decreased ostomy output however since reporting emergency department she's had return of ostomy function. Normal pain is always around her ostomy site and she states this is also improved since coming to emergency department.  HPI  Past Medical History  Diagnosis Date  . Hypertension   . Hypercalcemia   . Malignant neoplasm (HCC)     of descending colon  . Colon cancer ([place])   . Arthritis     Past Surgical History  Procedure Laterality Date  . Colostomy    . Cholecystectomy    . Cataracts    . Colon cancer surgery      Family History  Problem Relation Age of Onset  . CAD Brother   . Congestive Heart Failure Mother   . Breast cancer Daughter   . Alcohol abuse Son     Social History Social History  Substance Use Topics  . Smoking status: Never Smoker   . Smokeless tobacco: None  . Alcohol Use: No    No Known Allergies  Current Facility-Administered Medications  Medication Dose Route Frequency Provider Last Rate Last Dose  . cefTRIAXone (ROCEPHIN) 1 g in dextrose 5 % 50 mL IVPB  1 g Intravenous Once Daymon Larsen, MD 100 mL/hr at 03/18/16 1809     Current Outpatient Prescriptions  Medication Sig Dispense Refill  . amitriptyline (ELAVIL) 10 MG tablet Take 10 mg by mouth  at bedtime.   1  . amLODipine (NORVASC) 10 MG tablet Take 10 mg by mouth daily.  0  . aspirin EC 81 MG tablet Take 81 mg by mouth daily.    . carvedilol (COREG) 25 MG tablet Take 37.5 mg by mouth 2 (two) times daily.   0  . cholecalciferol (VITAMIN D) 1000 units tablet Take 1,000 Units by mouth daily.    . furosemide (LASIX) 20 MG tablet Take 20 mg by mouth daily.  0  . ibuprofen (ADVIL,MOTRIN) 200 MG tablet Take 400 mg by mouth every 6 (six) hours as needed for headache or mild pain.     Marland Kitchen lisinopril (PRINIVIL,ZESTRIL) 20 MG tablet Take 20 mg by mouth daily.    . pantoprazole (PROTONIX) 40 MG tablet Take 40 mg by mouth daily.   0  . POTASSIUM PO Take 1 tablet by mouth 2 (two) times daily.    . traMADol (ULTRAM) 50 MG tablet Take 50 mg by mouth every 6 (six) hours as needed for moderate pain.   0  . triamcinolone cream (KENALOG) 0.1 % Apply 1 application topically 2 (two) times daily as needed (for rash/itching).     . vitamin B-12 (CYANOCOBALAMIN) 1000 MCG tablet Take 1,000 mcg by mouth daily.       Review of Systems A Multi-point review of systems was asked and was negative except for the findings documented  in the history of present illness  Physical Exam Blood pressure 149/55, pulse 82, temperature 98.7 F (37.1 C), temperature source Oral, resp. rate 16, height 5\' 5"  (1.651 m), weight 92.987 kg (205 lb), SpO2 94 %. CONSTITUTIONAL: No acute distress. EYES: Pupils are equal, round, and reactive to light, Sclera are non-icteric. EARS, NOSE, MOUTH AND THROAT: The oropharynx is clear. The oral mucosa is pink and moist. Hearing is intact to voice. LYMPH NODES:  Lymph nodes in the neck are normal. RESPIRATORY:  Lungs are clear. There is normal respiratory effort, with equal breath sounds bilaterally, and without pathologic use of accessory muscles. CARDIOVASCULAR: Heart is regular without murmurs, gallops, or rubs. GI: The abdomen is soft, tender to deep palpation around her left lower  quadrant ostomy site, and nondistended. There is a palpable parastomal hernia that is soft on my exam. Ears no evidence of peritonitis, rebound, guarding. There is no hepatosplenomegaly. There are normal bowel sounds in all quadrants. GU: Rectal deferred.   MUSCULOSKELETAL: Normal muscle strength and tone. No cyanosis or edema.   SKIN: Turgor is good and there are no pathologic skin lesions or ulcers. NEUROLOGIC: Motor and sensation is grossly normal. Cranial nerves are grossly intact. PSYCH:  Oriented to person, place and time. Affect is normal.  Data Reviewed Images and labs reviewed CT scan does show dilated loops of small Bowel going into a parastomal hernia with decompressed bowel distal to the parastomal hernia. There is no evidence of free air or intra-abdominal infection. Labs show a possible urinary tract infection due to numerous white blood cells and bacteria seen on urinalysis. She has no leukocytosis with a white blood cell count of 8.0. She does have an acute kidney injury with a creatinine of 2.3. I have personally reviewed the patient's imaging, laboratory findings and medical records.    Assessment    Small bowel obstruction and acute kidney injury    Plan    82 year old female with a small bowel obstruction. Patient thinks that this is already starting to resolve with return of ostomy function since reporting to the emergency room. She's not had any nausea or vomiting now for the last 10 hours. Given these findings will bring her into the hospital under observation. Plan for IV hydration and IV AmBisome treatment of her urinary tract infection. Should her creatinine not respond to fluids and internal medicine consult will be obtained in the morning. Discussed with the patient that should her nausea and vomiting return that an NG tube might be required overnight. She voiced understanding.     Time spent with the patient was 55 minutes, with more than 50% of the time spent in  face-to-face education, counseling and care coordination.     Clayburn Pert, MD FACS General Surgeon 03/18/2016, 6:31 PM

## 2016-03-18 NOTE — ED Notes (Signed)
Reports abd pain and n/v.  States she was in hospital 3 weeks ago for bowel obstruction.  Skin w/d with good color at this time.

## 2016-03-19 ENCOUNTER — Observation Stay: Payer: Medicare Other

## 2016-03-19 DIAGNOSIS — Z803 Family history of malignant neoplasm of breast: Secondary | ICD-10-CM | POA: Diagnosis not present

## 2016-03-19 DIAGNOSIS — K5669 Other intestinal obstruction: Secondary | ICD-10-CM

## 2016-03-19 DIAGNOSIS — Z9049 Acquired absence of other specified parts of digestive tract: Secondary | ICD-10-CM | POA: Diagnosis not present

## 2016-03-19 DIAGNOSIS — Z8719 Personal history of other diseases of the digestive system: Secondary | ICD-10-CM | POA: Diagnosis not present

## 2016-03-19 DIAGNOSIS — Z933 Colostomy status: Secondary | ICD-10-CM | POA: Diagnosis not present

## 2016-03-19 DIAGNOSIS — I10 Essential (primary) hypertension: Secondary | ICD-10-CM | POA: Diagnosis present

## 2016-03-19 DIAGNOSIS — Z8249 Family history of ischemic heart disease and other diseases of the circulatory system: Secondary | ICD-10-CM | POA: Diagnosis not present

## 2016-03-19 DIAGNOSIS — J189 Pneumonia, unspecified organism: Secondary | ICD-10-CM | POA: Diagnosis not present

## 2016-03-19 DIAGNOSIS — K433 Parastomal hernia with obstruction, without gangrene: Secondary | ICD-10-CM | POA: Diagnosis present

## 2016-03-19 DIAGNOSIS — Z7982 Long term (current) use of aspirin: Secondary | ICD-10-CM | POA: Diagnosis not present

## 2016-03-19 DIAGNOSIS — Z818 Family history of other mental and behavioral disorders: Secondary | ICD-10-CM | POA: Diagnosis not present

## 2016-03-19 DIAGNOSIS — K565 Intestinal adhesions [bands] with obstruction (postprocedural) (postinfection): Secondary | ICD-10-CM | POA: Diagnosis present

## 2016-03-19 DIAGNOSIS — N179 Acute kidney failure, unspecified: Secondary | ICD-10-CM | POA: Diagnosis present

## 2016-03-19 DIAGNOSIS — Z9849 Cataract extraction status, unspecified eye: Secondary | ICD-10-CM | POA: Diagnosis not present

## 2016-03-19 DIAGNOSIS — M1990 Unspecified osteoarthritis, unspecified site: Secondary | ICD-10-CM | POA: Diagnosis present

## 2016-03-19 DIAGNOSIS — C189 Malignant neoplasm of colon, unspecified: Secondary | ICD-10-CM | POA: Diagnosis present

## 2016-03-19 DIAGNOSIS — N390 Urinary tract infection, site not specified: Secondary | ICD-10-CM | POA: Diagnosis not present

## 2016-03-19 DIAGNOSIS — Z85038 Personal history of other malignant neoplasm of large intestine: Secondary | ICD-10-CM | POA: Diagnosis not present

## 2016-03-19 DIAGNOSIS — E876 Hypokalemia: Secondary | ICD-10-CM | POA: Diagnosis present

## 2016-03-19 DIAGNOSIS — Z79899 Other long term (current) drug therapy: Secondary | ICD-10-CM | POA: Diagnosis not present

## 2016-03-19 DIAGNOSIS — Z811 Family history of alcohol abuse and dependence: Secondary | ICD-10-CM | POA: Diagnosis not present

## 2016-03-19 LAB — MAGNESIUM: Magnesium: 1.1 mg/dL — ABNORMAL LOW (ref 1.7–2.4)

## 2016-03-19 LAB — PROTIME-INR
INR: 1.16
PROTHROMBIN TIME: 15 s (ref 11.4–15.0)

## 2016-03-19 LAB — CBC
HEMATOCRIT: 35.6 % (ref 35.0–47.0)
HEMOGLOBIN: 12.2 g/dL (ref 12.0–16.0)
MCH: 31 pg (ref 26.0–34.0)
MCHC: 34.2 g/dL (ref 32.0–36.0)
MCV: 90.6 fL (ref 80.0–100.0)
Platelets: 203 10*3/uL (ref 150–440)
RBC: 3.93 MIL/uL (ref 3.80–5.20)
RDW: 12.9 % (ref 11.5–14.5)
WBC: 8 10*3/uL (ref 3.6–11.0)

## 2016-03-19 LAB — BASIC METABOLIC PANEL
ANION GAP: 12 (ref 5–15)
BUN: 31 mg/dL — ABNORMAL HIGH (ref 6–20)
CHLORIDE: 101 mmol/L (ref 101–111)
CO2: 19 mmol/L — AB (ref 22–32)
CREATININE: 1.98 mg/dL — AB (ref 0.44–1.00)
Calcium: 7.4 mg/dL — ABNORMAL LOW (ref 8.9–10.3)
GFR calc non Af Amer: 22 mL/min — ABNORMAL LOW (ref >60)
GFR, EST AFRICAN AMERICAN: 26 mL/min — AB (ref >60)
GLUCOSE: 102 mg/dL — AB (ref 65–99)
POTASSIUM: 3.5 mmol/L (ref 3.5–5.1)
Sodium: 132 mmol/L — ABNORMAL LOW (ref 135–145)

## 2016-03-19 LAB — PHOSPHORUS: PHOSPHORUS: 3.7 mg/dL (ref 2.5–4.6)

## 2016-03-19 LAB — APTT: aPTT: 36 seconds (ref 24–36)

## 2016-03-19 NOTE — Plan of Care (Signed)
Problem: Fluid Volume: Goal: Ability to maintain a balanced intake and output will improve Outcome: Progressing IV hydration and p.o fluid intake encouraged

## 2016-03-19 NOTE — Progress Notes (Signed)
Pt with persistent vomiting Md notified and ordered NGT. NGT inserted and immediately put out 700+ cc of billiary drainage. Will continue to  Monitor.

## 2016-03-19 NOTE — Progress Notes (Signed)
Subjective:   She is still nauseated and anorexic this morning. She's not vomiting much at the present time although she did vomit significantly last night. She says small amount of gas and stool in her ostomy. Her pain is improved. She is wondering if the nausea could be related to her pain medicine.  Vital signs in last 24 hours: Temp:  [98.1 F (36.7 C)-98.7 F (37.1 C)] 98.1 F (36.7 C) (04/18 0031) Pulse Rate:  [77-88] 80 (04/18 0431) Resp:  [16-20] 20 (04/18 0431) BP: (117-182)/(55-139) 164/60 mmHg (04/18 0431) SpO2:  [91 %-100 %] 99 % (04/18 0431) Weight:  [92.987 kg (205 lb)] 92.987 kg (205 lb) (04/17 1459) Last BM Date: 03/18/16  Intake/Output from previous day: 04/17 0701 - 04/18 0700 In: 959 [I.V.:959] Out: 870 [Urine:250; Emesis/NG output:500; Stool:120]  Exam:  Her abdomen is soft with few bowel sounds and no significant tenderness. She has no rebound or guarding. She does have palpable hernia around the ostomy which appears to reduce partially. I cannot appreciate whether it has reduced completely. She has no peritoneal signs.  Lab Results:  CBC  Recent Labs  03/18/16 1516 03/19/16 0435  WBC 8.0 8.0  HGB 11.8* 12.2  HCT 34.7* 35.6  PLT 199 203   CMP     Component Value Date/Time   NA 132* 03/19/2016 0435   K 3.5 03/19/2016 0435   CL 101 03/19/2016 0435   CO2 19* 03/19/2016 0435   GLUCOSE 102* 03/19/2016 0435   BUN 31* 03/19/2016 0435   CREATININE 1.98* 03/19/2016 0435   CALCIUM 7.4* 03/19/2016 0435   PROT 6.4* 03/18/2016 1516   ALBUMIN 2.9* 03/18/2016 1516   AST 16 03/18/2016 1516   ALT 8* 03/18/2016 1516   ALKPHOS 45 03/18/2016 1516   BILITOT 1.4* 03/18/2016 1516   GFRNONAA 22* 03/19/2016 0435   GFRAA 26* 03/19/2016 0435   PT/INR  Recent Labs  03/19/16 0435  LABPROT 15.0  INR 1.16    Studies/Results: Ct Abdomen Pelvis Wo Contrast  03/18/2016  CLINICAL DATA:  Abdominal pain for 1 day, history of recent bowel obstruction and colostomy  EXAM: CT ABDOMEN AND PELVIS WITHOUT CONTRAST TECHNIQUE: Multidetector CT imaging of the abdomen and pelvis was performed following the standard protocol without IV contrast. COMPARISON:  01/29/2016 FINDINGS: Lung bases demonstrate mild scarring without focal infiltrate or sizable effusion. The gallbladder is been surgically removed. Air is noted in the biliary tree consistent with prior sphincterotomy. The spleen, adrenal glands and pancreas are within normal limits. Kidneys demonstrate cystic changes bilaterally. There is fullness of the collecting systems and right greater than left without definitive obstructive lesion. These changes have progressed somewhat in the interval from the prior exam. Correlation laboratory values is recommended. There are multiple dilated loops of small bowel which extends to the level of the left lower quadrant ostomy at which point there is herniation of small bowel into the ostomy adjacent to the colon. This is the etiology of the patient's underlying obstructive change. The more distal small bowel is decompressed. The visualized portions of the colon show no acute abnormality. Some herniation of the colon into the ostomy site is noted as well. Some fluid is also noted within the ostomy site. The appendix is within normal limits. The bladder is partially distended. The uterus is stable in appearance. The osseous structures show degenerative change of the lumbar spine. No acute abnormality is noted. IMPRESSION: Changes consistent with proximal small bowel obstruction secondary to incarceration of small bowel within  the left lower quadrant ostomy site. The more distal small bowel is within normal limits. Increased fullness in the collecting systems of the kidneys bilaterally worse on the right than the left. No definitive obstructing lesion is seen. Correlation laboratory values is recommended. Postsurgical changes as described. Electronically Signed   By: Inez Catalina M.D.   On:  03/18/2016 17:28    Assessment/Plan: Her x-ray pattern is improved on plain films. She has minimal gas in the colon and no significant air-fluid levels. She does not feel well enough to drink any of her clear liquids this morning. We will continue to follow her watching her creatinine and IV fluid. Her creatinine was nearly 2 this morning. Discussed this plan with her in detail and she is in agreement.

## 2016-03-19 NOTE — Progress Notes (Signed)
Notified MD of another episode of vomiting this afternoon. MD reported that abd films were improving and we would not insert NGT at this time. Will continue to monitor closely and notify MD if another episode of vomiting occurs.

## 2016-03-20 ENCOUNTER — Inpatient Hospital Stay: Payer: Medicare Other

## 2016-03-20 LAB — BASIC METABOLIC PANEL
ANION GAP: 10 (ref 5–15)
BUN: 28 mg/dL — AB (ref 6–20)
CO2: 22 mmol/L (ref 22–32)
Calcium: 7 mg/dL — ABNORMAL LOW (ref 8.9–10.3)
Chloride: 102 mmol/L (ref 101–111)
Creatinine, Ser: 1.96 mg/dL — ABNORMAL HIGH (ref 0.44–1.00)
GFR calc Af Amer: 26 mL/min — ABNORMAL LOW (ref >60)
GFR, EST NON AFRICAN AMERICAN: 22 mL/min — AB (ref >60)
GLUCOSE: 85 mg/dL (ref 65–99)
POTASSIUM: 3.4 mmol/L — AB (ref 3.5–5.1)
Sodium: 134 mmol/L — ABNORMAL LOW (ref 135–145)

## 2016-03-20 LAB — CBC
HEMATOCRIT: 32.7 % — AB (ref 35.0–47.0)
Hemoglobin: 11.1 g/dL — ABNORMAL LOW (ref 12.0–16.0)
MCH: 30.1 pg (ref 26.0–34.0)
MCHC: 34 g/dL (ref 32.0–36.0)
MCV: 88.7 fL (ref 80.0–100.0)
PLATELETS: 176 10*3/uL (ref 150–440)
RBC: 3.68 MIL/uL — AB (ref 3.80–5.20)
RDW: 12.7 % (ref 11.5–14.5)
WBC: 6.3 10*3/uL (ref 3.6–11.0)

## 2016-03-20 LAB — URINE CULTURE

## 2016-03-20 MED ORDER — SODIUM CHLORIDE 0.9 % IV BOLUS (SEPSIS)
500.0000 mL | Freq: Once | INTRAVENOUS | Status: AC
  Administered 2016-03-20: 500 mL via INTRAVENOUS

## 2016-03-20 MED ORDER — CARVEDILOL 3.125 MG PO TABS
3.1250 mg | ORAL_TABLET | Freq: Once | ORAL | Status: AC
  Administered 2016-03-20: 3.125 mg via ORAL

## 2016-03-20 MED ORDER — METOPROLOL TARTRATE 1 MG/ML IV SOLN
5.0000 mg | INTRAVENOUS | Status: DC | PRN
  Administered 2016-03-21: 5 mg via INTRAVENOUS
  Filled 2016-03-20: qty 5

## 2016-03-20 MED ORDER — SODIUM CHLORIDE 0.9 % IV SOLN
INTRAVENOUS | Status: DC
  Administered 2016-03-20 – 2016-03-21 (×2): via INTRAVENOUS

## 2016-03-20 MED ORDER — CARVEDILOL 6.25 MG PO TABS
6.2500 mg | ORAL_TABLET | Freq: Two times a day (BID) | ORAL | Status: DC
  Administered 2016-03-20 – 2016-03-23 (×6): 6.25 mg via ORAL
  Filled 2016-03-20 (×8): qty 1

## 2016-03-20 NOTE — Progress Notes (Signed)
CC: Nausea vomiting Subjective: Patient required NG tube placement yesterday. She reports feeling much better this morning. Her abdominal pain is mostly resolved and she's not had any continued nausea vomiting after NG tube decompression.  Objective: Vital signs in last 24 hours: Temp:  [97.6 F (36.4 C)-98.6 F (37 C)] 98.4 F (36.9 C) (04/19 0449) Pulse Rate:  [72-85] 72 (04/19 0449) Resp:  [20] 20 (04/19 0449) BP: (139-182)/(52-72) 139/64 mmHg (04/19 0449) SpO2:  [93 %-99 %] 93 % (04/19 0449) Last BM Date: 03/18/16  Intake/Output from previous day: 04/18 0701 - 04/19 0700 In: 1807 [I.V.:1717; NG/GT:90] Out: 3350 [Urine:400; Emesis/NG output:2950] Intake/Output this shift: Total I/O In: -  Out: 100 [Urine:100]  Physical exam:  Gen.: No acute distress Chest: Clear to auscultation Heart: Regular rhythm Abdomen: Soft, nontender, nondistended. Ostomy in place that is productive of stool.  Lab Results: CBC   Recent Labs  03/19/16 0435 03/20/16 0403  WBC 8.0 6.3  HGB 12.2 11.1*  HCT 35.6 32.7*  PLT 203 176   BMET  Recent Labs  03/19/16 0435 03/20/16 0403  NA 132* 134*  K 3.5 3.4*  CL 101 102  CO2 19* 22  GLUCOSE 102* 85  BUN 31* 28*  CREATININE 1.98* 1.96*  CALCIUM 7.4* 7.0*   PT/INR  Recent Labs  03/19/16 0435  LABPROT 15.0  INR 1.16   ABG No results for input(s): PHART, HCO3 in the last 72 hours.  Invalid input(s): PCO2, PO2  Studies/Results: Ct Abdomen Pelvis Wo Contrast  03/18/2016  CLINICAL DATA:  Abdominal pain for 1 day, history of recent bowel obstruction and colostomy EXAM: CT ABDOMEN AND PELVIS WITHOUT CONTRAST TECHNIQUE: Multidetector CT imaging of the abdomen and pelvis was performed following the standard protocol without IV contrast. COMPARISON:  01/29/2016 FINDINGS: Lung bases demonstrate mild scarring without focal infiltrate or sizable effusion. The gallbladder is been surgically removed. Air is noted in the biliary tree  consistent with prior sphincterotomy. The spleen, adrenal glands and pancreas are within normal limits. Kidneys demonstrate cystic changes bilaterally. There is fullness of the collecting systems and right greater than left without definitive obstructive lesion. These changes have progressed somewhat in the interval from the prior exam. Correlation laboratory values is recommended. There are multiple dilated loops of small bowel which extends to the level of the left lower quadrant ostomy at which point there is herniation of small bowel into the ostomy adjacent to the colon. This is the etiology of the patient's underlying obstructive change. The more distal small bowel is decompressed. The visualized portions of the colon show no acute abnormality. Some herniation of the colon into the ostomy site is noted as well. Some fluid is also noted within the ostomy site. The appendix is within normal limits. The bladder is partially distended. The uterus is stable in appearance. The osseous structures show degenerative change of the lumbar spine. No acute abnormality is noted. IMPRESSION: Changes consistent with proximal small bowel obstruction secondary to incarceration of small bowel within the left lower quadrant ostomy site. The more distal small bowel is within normal limits. Increased fullness in the collecting systems of the kidneys bilaterally worse on the right than the left. No definitive obstructing lesion is seen. Correlation laboratory values is recommended. Postsurgical changes as described. Electronically Signed   By: Inez Catalina M.D.   On: 03/18/2016 17:28   Dg Abd Portable 2v  03/19/2016  CLINICAL DATA:  In pt, SBO EXAM: PORTABLE ABDOMEN - 2 VIEW COMPARISON:  CT  from previous day FINDINGS: The stomach is nondilated. Residual oral contrast in small bowel. A few fluid levels are noted in distended small bowel on the erect radiograph. The colon appears decompressed. Surgical clips in the lower abdomen.  Bilateral pelvic phleboliths. Mild degenerative changes in the visualized thoracolumbar spine. Cholecystectomy clips. IMPRESSION: 1. Fluid levels in mildly distended small bowel suggesting partial obstruction versus ileus. 2. No free air. Electronically Signed   By: Lucrezia Europe M.D.   On: 03/19/2016 08:35    Anti-infectives: Anti-infectives    Start     Dose/Rate Route Frequency Ordered Stop   03/18/16 2100  cefTRIAXone (ROCEPHIN) 1 g in dextrose 5 % 50 mL IVPB     1 g 100 mL/hr over 30 Minutes Intravenous Daily-1800 03/18/16 2044     03/18/16 1730  cefTRIAXone (ROCEPHIN) 1 g in dextrose 5 % 50 mL IVPB     1 g 100 mL/hr over 30 Minutes Intravenous  Once 03/18/16 1717 03/18/16 2012      Assessment/Plan:  82 year old female with small bowel obstruction thought to be secondary to peristomal hernia. Required NG tube placement yesterday. After large-volume NG output the output has decreased dramatically overnight. Plan to continue to monitor NG output today with possible clamp trial later today. Encourage ambulation, incentive spirometer usage. Continues to have acute kidney injury so internal medicine consultation has been requested.  Jaray Boliver T. Adonis Huguenin, MD, FACS  03/20/2016

## 2016-03-20 NOTE — Consult Note (Signed)
[facility] NAME: [name]    MR#:  [id number]  DATE OF BIRTH:  [birth year]  DATE OF ADMISSION:  03/18/2016  PRIMARY CARE PHYSICIAN: Rusty Aus, MD   REQUESTING/REFERRING PHYSICIAN:[place]  CHIEF COMPLAINT:   AKI HISTORY OF PRESENT ILLNESS:  [name]  is a 82 y.o. female with a known history of malignant colon cancer, essential hypertension is admitted to the hospital under surgical service for small bowel obstruction. Hospitalist team is consulted for acute kidney injury. Patient is resting comfortably without any abdominal pain nausea or vomiting.  PAST MEDICAL HISTORY:   Past Medical History  Diagnosis Date  . Hypertension   . Hypercalcemia   . Malignant neoplasm (HCC)     of descending colon  . Colon cancer ([place])   . Arthritis     PAST SURGICAL HISTOIRY:   Past Surgical History  Procedure Laterality Date  . Colostomy    . Cholecystectomy    . Cataracts    . Colon cancer surgery      SOCIAL HISTORY:   Social History  Substance Use Topics  . Smoking status: Never Smoker   . Smokeless tobacco: Not on file  . Alcohol Use: No    FAMILY HISTORY:   Family History  Problem Relation Age of Onset  . CAD Brother   . Congestive Heart Failure Mother   . Breast cancer Daughter   . Alcohol abuse Son     DRUG ALLERGIES:  No Known Allergies  REVIEW OF SYSTEMS:  CONSTITUTIONAL: No fever, fatigue or weakness.  EYES: No blurred or double vision.  EARS, NOSE, AND THROAT: No tinnitus or ear pain.  RESPIRATORY: No cough, shortness of breath, wheezing or hemoptysis.  CARDIOVASCULAR: No chest pain, orthopnea, edema.  GASTROINTESTINAL: Has  Nausea, denies  vomiting, diarrhea or abdominal pain. No flatus GENITOURINARY: No dysuria, hematuria.  ENDOCRINE: No polyuria, nocturia,  HEMATOLOGY: No anemia, easy bruising or bleeding SKIN: No rash or lesion. MUSCULOSKELETAL: No joint pain or arthritis.    NEUROLOGIC: No tingling, numbness, weakness.  PSYCHIATRY: No anxiety or depression.   MEDICATIONS AT HOME:   Prior to Admission medications   Medication Sig Start Date End Date Taking? Authorizing Provider  amitriptyline (ELAVIL) 10 MG tablet Take 10 mg by mouth at bedtime.    Yes Historical Provider, MD  amLODipine (NORVASC) 10 MG tablet Take 10 mg by mouth daily.   Yes Historical Provider, MD  aspirin EC 81 MG tablet Take 81 mg by mouth daily.   Yes Historical Provider, MD  carvedilol (COREG) 25 MG tablet Take 37.5 mg by mouth 2 (two) times daily.    Yes Historical Provider, MD  cholecalciferol (VITAMIN D) 1000 units tablet Take 1,000 Units by mouth daily.   Yes Historical Provider, MD  furosemide (LASIX) 20 MG tablet Take 20 mg by mouth daily.   Yes Historical Provider, MD  ibuprofen (ADVIL,MOTRIN) 200 MG tablet Take 400 mg by mouth every 6 (six) hours as needed for headache or mild pain.    Yes Historical Provider, MD  lisinopril (PRINIVIL,ZESTRIL) 20 MG tablet Take 20 mg by mouth daily.   Yes Historical Provider, MD  pantoprazole (PROTONIX) 40 MG tablet Take 40 mg by mouth daily.    Yes Historical Provider, MD  POTASSIUM PO Take 1 tablet by mouth 2 (two) times daily.   Yes Historical Provider, MD  traMADol (ULTRAM) 50 MG tablet Take 50 mg by mouth every 6 (six)  hours as needed for moderate pain.    Yes Historical Provider, MD  triamcinolone cream (KENALOG) 0.1 % Apply 1 application topically 2 (two) times daily as needed (for rash/itching).    Yes Historical Provider, MD  vitamin B-12 (CYANOCOBALAMIN) 1000 MCG tablet Take 1,000 mcg by mouth daily.   Yes Historical Provider, MD      VITAL SIGNS:  Blood pressure 161/54, pulse 69, temperature 97.9 F (36.6 C), temperature source Oral, resp. rate 18, height 5\' 5"  (1.651 m), weight 92.987 kg (205 lb), SpO2 95 %.  PHYSICAL EXAMINATION:  GENERAL:  82 y.o.-year-old patient lying in the bed with no acute distress.  EYES: Pupils equal,  round, reactive to light and accommodation. No scleral icterus. Extraocular muscles intact.  HEENT: Head atraumatic, normocephalic. Oropharynx and nasopharynx clear. NG tube is intact with approximately 600 cc of suction NECK:  Supple, no jugular venous distention. No thyroid enlargement, no tenderness.  LUNGS: Normal breath sounds bilaterally, no wheezing, rales,rhonchi or crepitation. No use of accessory muscles of respiration.  CARDIOVASCULAR: S1, S2 normal. No murmurs, rubs, or gallops.  ABDOMEN: Soft,  Nondistended. No  bowel sounds present. Intact colostomy  EXTREMITIES: No pedal edema, cyanosis, or clubbing.  NEUROLOGIC: Cranial nerves II through XII are intact. Muscle strength 5/5 in all extremities. Sensation intact. Gait not checked.  PSYCHIATRIC: The patient is alert and oriented x 3.  SKIN: No obvious rash, lesion, or ulcer.   LABORATORY PANEL:   CBC  Recent Labs Lab 03/20/16 0403  WBC 6.3  HGB 11.1*  HCT 32.7*  PLT 176   ------------------------------------------------------------------------------------------------------------------  Chemistries   Recent Labs Lab 03/18/16 1516 03/19/16 0435 03/20/16 0403  NA 132* 132* 134*  K 3.5 3.5 3.4*  CL 102 101 102  CO2 17* 19* 22  GLUCOSE 104* 102* 85  BUN 32* 31* 28*  CREATININE 2.30* 1.98* 1.96*  CALCIUM 7.5* 7.4* 7.0*  MG  --  1.1*  --   AST 16  --   --   ALT 8*  --   --   ALKPHOS 45  --   --   BILITOT 1.4*  --   --    ------------------------------------------------------------------------------------------------------------------  Cardiac Enzymes No results for input(s): TROPONINI in the last 168 hours. ------------------------------------------------------------------------------------------------------------------  RADIOLOGY:  Ct Abdomen Pelvis Wo Contrast  03/18/2016  CLINICAL DATA:  Abdominal pain for 1 day, history of recent bowel obstruction and colostomy EXAM: CT ABDOMEN AND PELVIS WITHOUT  CONTRAST TECHNIQUE: Multidetector CT imaging of the abdomen and pelvis was performed following the standard protocol without IV contrast. COMPARISON:  01/29/2016 FINDINGS: Lung bases demonstrate mild scarring without focal infiltrate or sizable effusion. The gallbladder is been surgically removed. Air is noted in the biliary tree consistent with prior sphincterotomy. The spleen, adrenal glands and pancreas are within normal limits. Kidneys demonstrate cystic changes bilaterally. There is fullness of the collecting systems and right greater than left without definitive obstructive lesion. These changes have progressed somewhat in the interval from the prior exam. Correlation laboratory values is recommended. There are multiple dilated loops of small bowel which extends to the level of the left lower quadrant ostomy at which point there is herniation of small bowel into the ostomy adjacent to the colon. This is the etiology of the patient's underlying obstructive change. The more distal small bowel is decompressed. The visualized portions of the colon show no acute abnormality. Some herniation of the colon into the ostomy site is noted as well. Some fluid is also noted  within the ostomy site. The appendix is within normal limits. The bladder is partially distended. The uterus is stable in appearance. The osseous structures show degenerative change of the lumbar spine. No acute abnormality is noted. IMPRESSION: Changes consistent with proximal small bowel obstruction secondary to incarceration of small bowel within the left lower quadrant ostomy site. The more distal small bowel is within normal limits. Increased fullness in the collecting systems of the kidneys bilaterally worse on the right than the left. No definitive obstructing lesion is seen. Correlation laboratory values is recommended. Postsurgical changes as described. Electronically Signed   By: Inez Catalina M.D.   On: 03/18/2016 17:28   Dg Abd 2  Views  03/20/2016  CLINICAL DATA:  Nausea, vomiting, abdominal pain, prior bowel obstructions, similar symptoms now EXAM: ABDOMEN - 2 VIEW COMPARISON:  03/19/2016 radiographs, 03/18/2016 CT abdomen pelvis FINDINGS: Small amount residual contrast in RIGHT colon. Numerous surgical clips in pelvis. Nasogastric tube projects over mid stomach. Few minimally dilated small bowel loops in LEFT mid abdomen. No definite bowel wall thickening or free intraperitoneal air. Pneumobilia RIGHT upper quadrant, seen on prior CT as well. Bibasilar atelectasis. Diffuse osseous demineralization. IMPRESSION: Minimally dilated small bowel loops in the LEFT mid abdomen question small bowel obstruction. Electronically Signed   By: Lavonia Dana M.D.   On: 03/20/2016 08:35   Dg Abd Portable 2v  03/19/2016  CLINICAL DATA:  In pt, SBO EXAM: PORTABLE ABDOMEN - 2 VIEW COMPARISON:  CT from previous day FINDINGS: The stomach is nondilated. Residual oral contrast in small bowel. A few fluid levels are noted in distended small bowel on the erect radiograph. The colon appears decompressed. Surgical clips in the lower abdomen. Bilateral pelvic phleboliths. Mild degenerative changes in the visualized thoracolumbar spine. Cholecystectomy clips. IMPRESSION: 1. Fluid levels in mildly distended small bowel suggesting partial obstruction versus ileus. 2. No free air. Electronically Signed   By: Lucrezia Europe M.D.   On: 03/19/2016 08:35    EKG:   Orders placed or performed during the hospital encounter of 01/29/16  . EKG 12-Lead  . EKG 12-Lead  . EKG 12-Lead  . EKG 12-Lead    IMPRESSION AND PLAN:   [name]  is a 82 y.o. female with a known history of malignant colon cancer, essential hypertension is admitted to the hospital under surgical service for small bowel obstruction. Hospitalist team is consulted for acute kidney injury. Patient is resting comfortably without any abdominal pain nausea or vomiting.  # Acute kidney injury-probably  prerenal from poor by mouth intake 2/2 small bowel obstruction Change IV fluids from lactated Ringer to normal saline at 125 ML per hour, will provide 500 cc bolus prior to that Monitor renal function closely. Discontinue lisinopril  Check a.m. labs and avoid nephrotoxins Will consider renal ultrasound if no improvement Place Foley catheter for close monitoring of the intake and output  #Essential hypertension with elevated blood pressure Discontinue lisinopril and increase the Coreg dose to 6.25 mg by mouth twice a day. Provided Lopressor 5 mg IV every 6 hours when necessary  #Malignant colon cancer status post colostomy Outpatient follow-up with oncology as recommended by them and continue colostomy care  #Small bowel obstruction Continue NG tube  Management per  surgery  Thank you dr.Woodham  for allowing hospitalist team to take care of this patient  All the records are reviewed and case discussed with ED provider. Management plans discussed with the patient, family and they are in agreement.  CODE STATUS:fc  TOTAL TIME TAKING CARE OF THIS PATIENT:45  minutes.    Nicholes Mango M.D on 03/20/2016 at 5:04 PM  Between 7am to 6pm - Pager - [phone]  After 6pm go to [email or web address] - password [email or web address] [facility]  Office  [phone]  CC: Primary care physician; Rusty Aus, MD

## 2016-03-21 MED ORDER — SODIUM CHLORIDE 0.9 % IV SOLN
INTRAVENOUS | Status: DC
  Administered 2016-03-21 – 2016-03-22 (×2): via INTRAVENOUS

## 2016-03-21 MED ORDER — OXYCODONE HCL 5 MG PO TABS: 5.0000 mg | ORAL_TABLET | ORAL | Status: DC | PRN

## 2016-03-21 MED ORDER — POTASSIUM CHLORIDE 10 MEQ/100ML IV SOLN
10.0000 meq | INTRAVENOUS | Status: AC
  Administered 2016-03-21 (×3): 10 meq via INTRAVENOUS
  Filled 2016-03-21 (×3): qty 100

## 2016-03-21 NOTE — Evaluation (Signed)
Physical Therapy Evaluation Patient Details Name: [name] MRN: [id number] DOB: 1930-09-12 Today's Date: 03/21/2016   History of Present Illness  Pt is an 82 y.o. female presenting to hospital with nausea, vomiting, and abdominal pain and admitted with SBO.  NG tube 03/20/16 but now discontinued.  PMH includes colon CA with colectomy/colostomy (with peristomal hernia), htn  Clinical Impression  Prior to admission, pt was modified independent ambulating with RW in the home.  Pt lives with her husband in 1 level home with stairs to enter.  Currently pt is modified independent supine to sit; SBA to CGA with transfers; and CGA with ambulation 220 feet with RW.  Pt would benefit from skilled PT to address noted impairments and functional limitations during hospital stay.  Recommend pt discharge to home with support of family when medically appropriate.  No PT needs anticipated upon discharge from hospital.     Follow Up Recommendations No PT follow up    Equipment Recommendations   (Pt already owns RW)    Recommendations for Other Services       Precautions / Restrictions Precautions Precautions: Fall Precaution Comments: LLQ colostomy Restrictions Weight Bearing Restrictions: No      Mobility  Bed Mobility Overal bed mobility: Modified Independent             General bed mobility comments: Supine to sit  Transfers Overall transfer level: Needs assistance Equipment used: Rolling walker (2 wheeled) Transfers: Sit to/from [facility] Sit to Stand: Supervision Stand pivot transfers: Min guard (toilet transfer)       General transfer comment: steady without loss of balance  Ambulation/Gait Ambulation/Gait assistance: Min guard Ambulation Distance (Feet): 220 Feet Assistive device: Rolling walker (2 wheeled) Gait Pattern/deviations: Step-through pattern     General Gait Details: steady without loss of balance; pt requesting to ambulate around  entire nursing loop  Stairs            Wheelchair Mobility    Modified Rankin (Stroke Patients Only)       Balance Overall balance assessment: Needs assistance Sitting-balance support: No upper extremity supported;Feet supported Sitting balance-Leahy Scale: Normal     Standing balance support: Bilateral upper extremity supported;During functional activity (on RW) Standing balance-Leahy Scale: Good                               Pertinent Vitals/Pain Pain Assessment: No/denies pain  Vitals stable and WFL throughout treatment session.    Home Living Family/patient expects to be discharged to:: Private residence Living Arrangements: Spouse/significant other Available Help at Discharge: Family;Available 24 hours/day (Husband) Type of Home: House Home Access: Stairs to enter Entrance Stairs-Rails: Right;Left;Can reach both Entrance Stairs-Number of Steps: 3 Home Layout: One level Home Equipment: Walker - 4 wheels;Walker - 2 wheels      Prior Function Level of Independence: Independent with assistive device(s)         Comments: Modified independent for ADL's; uses RW for household mobility and 4ww for community mobility; denies any falls in past 6 months     Hand Dominance        Extremity/Trunk Assessment   Upper Extremity Assessment: Generalized weakness           Lower Extremity Assessment: Generalized weakness         Communication   Communication: No difficulties  Cognition Arousal/Alertness: Awake/alert Behavior During Therapy: WFL for tasks assessed/performed Overall Cognitive Status: Within Functional Limits for  tasks assessed                      General Comments General comments (skin integrity, edema, etc.): L ankle/foot with increased edema compared to R  Nursing cleared pt for participation in physical therapy.  Pt agreeable to PT session.    Exercises   Performed semi-supine B LE therapeutic exercise x 10  reps:  Ankle pumps (AROM B LE's); quad sets x3 second holds (AROM B LE's); glute squeezes x3 second holds (AROM B); SAQ's (AROM R; AROM L); heelslides (AROM R; AROM L), hip abd/adduction (AROM R; AROM L).  Pt required vc's and tactile cues for correct technique with exercises.       Assessment/Plan    PT Assessment Patient needs continued PT services  PT Diagnosis Generalized weakness   PT Problem List Decreased strength;Decreased activity tolerance;Decreased balance;Decreased mobility  PT Treatment Interventions DME instruction;Gait training;Stair training;Functional mobility training;Therapeutic activities;Therapeutic exercise;Balance training;Patient/family education   PT Goals (Current goals can be found in the Care Plan section) Acute Rehab PT Goals Patient Stated Goal: to go home PT Goal Formulation: With patient Time For Goal Achievement: 04/04/16 Potential to Achieve Goals: Good    Frequency Min 2X/week   Barriers to discharge        Co-evaluation               End of Session Equipment Utilized During Treatment: Gait belt Activity Tolerance: Patient tolerated treatment well Patient left: in chair;with call bell/phone within reach;with chair alarm set Nurse Communication: Mobility status;Precautions         Time: 1427-1500 PT Time Calculation (min) (ACUTE ONLY): 33 min   Charges:   PT Evaluation $PT Eval Low Complexity: 1 Procedure PT Treatments $Therapeutic Exercise: 8-22 mins   PT G CodesLeitha Bleak 03-31-16, 4:57 PM Leitha Bleak, [place]

## 2016-03-21 NOTE — Progress Notes (Signed)
Patient ID: [name], female   DOB: 04/08/1930, 82 y.o.   MRN: [id number] [facility] PROGRESS NOTE  [name] [id number] DOB: [birth year] DOA: 03/18/2016 PCP: Rusty Aus, MD  HPI/Subjective: Patient feels okay. No abdominal pain. No nausea or vomiting since presentation.  Objective: Filed Vitals:   03/21/16 0953 03/21/16 1243  BP: 182/64 166/63  Pulse: 76 77  Temp:  97.7 F (36.5 C)  Resp:  17    Filed Weights   03/18/16 1459  Weight: 92.987 kg (205 lb)    ROS: Review of Systems  Constitutional: Negative for fever and chills.  Eyes: Negative for blurred vision.  Respiratory: Negative for cough and shortness of breath.   Cardiovascular: Negative for chest pain.  Gastrointestinal: Negative for nausea, vomiting, abdominal pain, diarrhea and constipation.  Genitourinary: Negative for dysuria.  Musculoskeletal: Negative for joint pain.  Neurological: Negative for dizziness and headaches.   Exam: Physical Exam  Constitutional: She is oriented to person, place, and time.  HENT:  Nose: No mucosal edema.  Mouth/Throat: No oropharyngeal exudate or posterior oropharyngeal edema.  Eyes: Conjunctivae, EOM and lids are normal. Pupils are equal, round, and reactive to light.  Neck: No JVD present. Carotid bruit is not present. No edema present. No thyroid mass and no thyromegaly present.  Cardiovascular: S1 normal and S2 normal.  Exam reveals no gallop.   No murmur heard. Pulses:      Dorsalis pedis pulses are 2+ on the right side, and 2+ on the left side.  Respiratory: No respiratory distress. She has no wheezes. She has no rhonchi. She has no rales.  GI: Soft. Bowel sounds are decreased. There is no tenderness.  Musculoskeletal:       Right ankle: She exhibits no swelling.       Left ankle: She exhibits no swelling.  Lymphadenopathy:    She has no cervical adenopathy.  Neurological: She is alert and oriented to person, place, and time. No cranial nerve  deficit.  Skin: Skin is warm. No rash noted. Nails show no clubbing.  Psychiatric: She has a normal mood and affect.      Data Reviewed: Basic Metabolic Panel:  Recent Labs Lab 03/18/16 1516 03/19/16 0435 03/20/16 0403  NA 132* 132* 134*  K 3.5 3.5 3.4*  CL 102 101 102  CO2 17* 19* 22  GLUCOSE 104* 102* 85  BUN 32* 31* 28*  CREATININE 2.30* 1.98* 1.96*  CALCIUM 7.5* 7.4* 7.0*  MG  --  1.1*  --   PHOS  --  3.7  --    Liver Function Tests:  Recent Labs Lab 03/18/16 1516  AST 16  ALT 8*  ALKPHOS 45  BILITOT 1.4*  PROT 6.4*  ALBUMIN 2.9*    Recent Labs Lab 03/18/16 1516  LIPASE 16   CBC:  Recent Labs Lab 03/18/16 1516 03/19/16 0435 03/20/16 0403  WBC 8.0 8.0 6.3  NEUTROABS 6.3  --   --   HGB 11.8* 12.2 11.1*  HCT 34.7* 35.6 32.7*  MCV 88.7 90.6 88.7  PLT 199 203 176     Studies: Dg Abd 2 Views  03/20/2016  CLINICAL DATA:  Nausea, vomiting, abdominal pain, prior bowel obstructions, similar symptoms now EXAM: ABDOMEN - 2 VIEW COMPARISON:  03/19/2016 radiographs, 03/18/2016 CT abdomen pelvis FINDINGS: Small amount residual contrast in RIGHT colon. Numerous surgical clips in pelvis. Nasogastric tube projects over mid stomach. Few minimally dilated small bowel loops in LEFT mid abdomen. No definite bowel wall  thickening or free intraperitoneal air. Pneumobilia RIGHT upper quadrant, seen on prior CT as well. Bibasilar atelectasis. Diffuse osseous demineralization. IMPRESSION: Minimally dilated small bowel loops in the LEFT mid abdomen question small bowel obstruction. Electronically Signed   By: Lavonia Dana M.D.   On: 03/20/2016 08:35    Scheduled Meds: . amitriptyline  10 mg Oral QHS  . amLODipine  10 mg Oral Daily  . carvedilol  6.25 mg Oral BID  . pantoprazole (PROTONIX) IV  40 mg Intravenous QHS   Continuous Infusions: . sodium chloride      Assessment/Plan:  1. Acute renal insufficiency. Continue IV fluid hydration. Hold lisinopril and  Lasix. 2. Essential hypertension. Continue Norvasc and Coreg 3. Small bowel obstruction. This is a repeat episode for the patient. Surgical team following. 4. Hypokalemia. Replace potassium and IV. Check a magnesium tomorrow morning 5. History of colon cancer 6. Stop Rocephin since urine culture did not grow anything  Code Status:     Code Status Orders        Start     Ordered   03/18/16 2045  Full code   Continuous     03/18/16 2044    Code Status History    Date Active Date Inactive Code Status Order ID Comments User Context   01/29/2016  7:01 PM 01/31/2016  6:56 PM Full Code [id number]  Jules Husbands, MD Inpatient   10/21/2015 12:08 PM 10/22/2015  5:20 PM Full Code [id number]  Demetrios Loll, MD Inpatient    Advance Directive Documentation        Most Recent Value   Type of Advance Directive  Living will, Healthcare Power of Attorney   Pre-existing out of facility DNR order (yellow form or pink MOST form)     "MOST" Form in Place?       Disposition Plan: As per surgery  Consultants:  Gen. surgery  Time spent: 25 minutes  Murphy, Gerrard

## 2016-03-21 NOTE — Progress Notes (Signed)
   Subjective:  She is feeling well this morning. She has had no nausea or abdominal pain overnight. She has had some gas in her ostomy. There is minimal stool there this morning.  Vital signs in last 24 hours: Temp:  [97.6 F (36.4 C)-98 F (36.7 C)] 98 F (36.7 C) (04/20 0542) Pulse Rate:  [69-81] 70 (04/20 0702) Resp:  [18-20] 20 (04/20 0542) BP: (161-187)/(53-71) 165/59 mmHg (04/20 0702) SpO2:  [95 %-99 %] 96 % (04/20 0702) Last BM Date: 03/18/16  Intake/Output from previous day: 10-Apr-2023 0701 - 04/20 0700 In: 3248.7 [I.V.:2638.7; NG/GT:60; IV Piggyback:550] Out: 1400 [Urine:1050; Emesis/NG output:350]  GI: soft, non-tender; bowel sounds normal; no masses,  no organomegaly  Lab Results:  CBC  Recent Labs  03/19/16 0435 04/09/16 0403  WBC 8.0 6.3  HGB 12.2 11.1*  HCT 35.6 32.7*  PLT 203 176   CMP     Component Value Date/Time   NA 134* 2016/04/09 0403   K 3.4* 04-09-2016 0403   CL 102 Apr 09, 2016 0403   CO2 22 04/09/16 0403   GLUCOSE 85 Apr 09, 2016 0403   BUN 28* 2016-04-09 0403   CREATININE 1.96* 2016-04-09 0403   CALCIUM 7.0* 04/09/2016 0403   PROT 6.4* 03/18/2016 1516   ALBUMIN 2.9* 03/18/2016 1516   AST 16 03/18/2016 1516   ALT 8* 03/18/2016 1516   ALKPHOS 45 03/18/2016 1516   BILITOT 1.4* 03/18/2016 1516   GFRNONAA 22* Apr 09, 2016 0403   GFRAA 26* 04/09/16 0403   PT/INR  Recent Labs  03/19/16 0435  LABPROT 15.0  INR 1.16    Studies/Results: Dg Abd 2 Views  04/09/2016  CLINICAL DATA:  Nausea, vomiting, abdominal pain, prior bowel obstructions, similar symptoms now EXAM: ABDOMEN - 2 VIEW COMPARISON:  03/19/2016 radiographs, 03/18/2016 CT abdomen pelvis FINDINGS: Small amount residual contrast in RIGHT colon. Numerous surgical clips in pelvis. Nasogastric tube projects over mid stomach. Few minimally dilated small bowel loops in LEFT mid abdomen. No definite bowel wall thickening or free intraperitoneal air. Pneumobilia RIGHT upper quadrant, seen  on prior CT as well. Bibasilar atelectasis. Diffuse osseous demineralization. IMPRESSION: Minimally dilated small bowel loops in the LEFT mid abdomen question small bowel obstruction. Electronically Signed   By: Lavonia Dana M.D.   On: 04-09-16 08:35    Assessment/Plan: She appears to have done well without suction over the last 12 hours. We will discontinue her NG tube this morning and start her on a liquid diet later today. If she continues to improve we will advance her diet and hopefully avoid surgery at the present time. However, I am concerned about the hernia and this is her second episode in a short period of time. She may need to consider elective repair of this parastomal hernia. We discussed this option with her in detail.

## 2016-03-21 NOTE — Care Management Important Message (Signed)
Important Message  Patient Details  Name: [name] MRN: [id number] Date of Birth: 12-06-1929   Medicare Important Message Given:  Yes    Juliann Pulse A Lorenzo Pereyra 03/21/2016, 10:51 AM

## 2016-03-22 LAB — BASIC METABOLIC PANEL
ANION GAP: 10 (ref 5–15)
BUN: 24 mg/dL — ABNORMAL HIGH (ref 6–20)
CALCIUM: 7.3 mg/dL — AB (ref 8.9–10.3)
CO2: 19 mmol/L — ABNORMAL LOW (ref 22–32)
CREATININE: 1.51 mg/dL — AB (ref 0.44–1.00)
Chloride: 110 mmol/L (ref 101–111)
GFR, EST AFRICAN AMERICAN: 36 mL/min — AB (ref >60)
GFR, EST NON AFRICAN AMERICAN: 31 mL/min — AB (ref >60)
Glucose, Bld: 91 mg/dL (ref 65–99)
Potassium: 3.5 mmol/L (ref 3.5–5.1)
Sodium: 139 mmol/L (ref 135–145)

## 2016-03-22 LAB — MAGNESIUM: Magnesium: 1.1 mg/dL — ABNORMAL LOW (ref 1.7–2.4)

## 2016-03-22 MED ORDER — POTASSIUM CHLORIDE CRYS ER 20 MEQ PO TBCR
40.0000 meq | EXTENDED_RELEASE_TABLET | Freq: Once | ORAL | Status: AC
  Administered 2016-03-22: 40 meq via ORAL
  Filled 2016-03-22: qty 2

## 2016-03-22 MED ORDER — HYDRALAZINE HCL 25 MG PO TABS
25.0000 mg | ORAL_TABLET | Freq: Three times a day (TID) | ORAL | Status: DC
  Administered 2016-03-22 – 2016-03-23 (×3): 25 mg via ORAL
  Filled 2016-03-22 (×3): qty 1

## 2016-03-22 MED ORDER — MAGNESIUM OXIDE 400 (241.3 MG) MG PO TABS
400.0000 mg | ORAL_TABLET | Freq: Every day | ORAL | Status: DC
  Administered 2016-03-22: 400 mg via ORAL
  Filled 2016-03-22: qty 1

## 2016-03-22 MED ORDER — MAGNESIUM SULFATE 4 GM/100ML IV SOLN
4.0000 g | Freq: Once | INTRAVENOUS | Status: AC
  Administered 2016-03-22: 4 g via INTRAVENOUS
  Filled 2016-03-22: qty 100

## 2016-03-22 MED ORDER — PANTOPRAZOLE SODIUM 40 MG PO TBEC
40.0000 mg | DELAYED_RELEASE_TABLET | Freq: Every day | ORAL | Status: DC
  Administered 2016-03-22: 40 mg via ORAL
  Filled 2016-03-22: qty 1

## 2016-03-22 NOTE — Progress Notes (Signed)
CC: Nausea and vomiting Subjective: Patient reports that her nausea and vomiting have been resolved and she is been tolerating a liquid diet. She's been having ostomy output with minimal to no abdominal discomfort. She does report a feeling of malaise currently but states she is still much improved from when she came in the hospital.  Objective: Vital signs in last 24 hours: Temp:  [97.7 F (36.5 C)-98.3 F (36.8 C)] 98.3 F (36.8 C) (04/21 0428) Pulse Rate:  [76-78] 78 (04/21 1005) Resp:  [16-20] 20 (04/21 0428) BP: (166-184)/(57-71) 184/71 mmHg (04/21 1005) SpO2:  [94 %-96 %] 94 % (04/21 0428) Last BM Date: 03/22/16  Intake/Output from previous day: 04/20 0701 - 04/21 0700 In: 2232.5 [P.O.:540; I.V.:1692.5] Out: 251 [Urine:250; Stool:1] Intake/Output this shift: Total I/O In: 869 [P.O.:760; I.V.:109] Out: 350 [Urine:350]  Physical exam:  Gen.: No acute distress Chest: Clear to auscultation Heart: Regular rate and rhythm Abdomen: Soft, nontender, nondistended. Ostomy present left lower quadrant that is productive of stool with a palpable parastomal hernia that is partially reducible on exam.  Lab Results: CBC   Recent Labs  03/20/16 0403  WBC 6.3  HGB 11.1*  HCT 32.7*  PLT 176   BMET  Recent Labs  03/20/16 0403 03/22/16 0542  NA 134* 139  K 3.4* 3.5  CL 102 110  CO2 22 19*  GLUCOSE 85 91  BUN 28* 24*  CREATININE 1.96* 1.51*  CALCIUM 7.0* 7.3*   PT/INR No results for input(s): LABPROT, INR in the last 72 hours. ABG No results for input(s): PHART, HCO3 in the last 72 hours.  Invalid input(s): PCO2, PO2  Studies/Results: No results found.  Anti-infectives: Anti-infectives    Start     Dose/Rate Route Frequency Ordered Stop   03/18/16 2100  cefTRIAXone (ROCEPHIN) 1 g in dextrose 5 % 50 mL IVPB  Status:  Discontinued     1 g 100 mL/hr over 30 Minutes Intravenous Daily-1800 03/18/16 2044 03/21/16 1446   03/18/16 1730  cefTRIAXone (ROCEPHIN) 1 g in  dextrose 5 % 50 mL IVPB     1 g 100 mL/hr over 30 Minutes Intravenous  Once 03/18/16 1717 03/18/16 2012      Assessment/Plan:  82 year old female admitted to the surgery service for recurrent small bowel obstruction secondary to parastomal hernia. Small bowel obstruction appears to have resolved. Patient tolerating a diet however we will advance her to a regular diet today. Appreciate internal medicine assistance with patient's lead pressure management as well as acute kidney injury. Her kidney function appears to be improving. Advancement of diet being done today with hopeful for discharge in the next 24-48 hours.  Teandra Harlan T. Adonis Huguenin, MD, FACS  03/22/2016

## 2016-03-22 NOTE — Progress Notes (Signed)
Patient ID: [name], female   DOB: 1930-01-08, 82 y.o.   MRN: [id number] [facility] PROGRESS NOTE  [name] [id number] DOB: [birth year] DOA: 03/18/2016 PCP: Rusty Aus, MD  HPI/Subjective: Patient seen this morning. She was feeling better with no abdominal pain nausea or vomiting. She had her NG tube taken out yesterday. Surgery was going to advance diet today. She started having some diarrhea  Objective: Filed Vitals:   03/22/16 1200 03/22/16 1259  BP: 156/70 158/65  Pulse:  71  Temp:  97.7 F (36.5 C)  Resp:  18    Filed Weights   03/18/16 1459  Weight: 92.987 kg (205 lb)    ROS: Review of Systems  Constitutional: Negative for fever and chills.  Eyes: Negative for blurred vision.  Respiratory: Negative for cough and shortness of breath.   Cardiovascular: Negative for chest pain.  Gastrointestinal: Positive for diarrhea. Negative for nausea, vomiting, abdominal pain and constipation.  Genitourinary: Negative for dysuria.  Musculoskeletal: Negative for joint pain.  Neurological: Negative for dizziness and headaches.   Exam: Physical Exam  Constitutional: She is oriented to person, place, and time.  HENT:  Nose: No mucosal edema.  Mouth/Throat: No oropharyngeal exudate or posterior oropharyngeal edema.  Eyes: Conjunctivae, EOM and lids are normal. Pupils are equal, round, and reactive to light.  Neck: No JVD present. Carotid bruit is not present. No edema present. No thyroid mass and no thyromegaly present.  Cardiovascular: S1 normal and S2 normal.  Exam reveals no gallop.   No murmur heard. Pulses:      Dorsalis pedis pulses are 2+ on the right side, and 2+ on the left side.  Respiratory: No respiratory distress. She has no wheezes. She has no rhonchi. She has no rales.  GI: Soft. Bowel sounds are decreased. There is no tenderness.  Musculoskeletal:       Right ankle: She exhibits no swelling.       Left ankle: She exhibits no swelling.   Lymphadenopathy:    She has no cervical adenopathy.  Neurological: She is alert and oriented to person, place, and time. No cranial nerve deficit.  Skin: Skin is warm. No rash noted. Nails show no clubbing.  Psychiatric: She has a normal mood and affect.      Data Reviewed: Basic Metabolic Panel:  Recent Labs Lab 03/18/16 1516 03/19/16 0435 03/20/16 0403 03/22/16 0542  NA 132* 132* 134* 139  K 3.5 3.5 3.4* 3.5  CL 102 101 102 110  CO2 17* 19* 22 19*  GLUCOSE 104* 102* 85 91  BUN 32* 31* 28* 24*  CREATININE 2.30* 1.98* 1.96* 1.51*  CALCIUM 7.5* 7.4* 7.0* 7.3*  MG  --  1.1*  --  1.1*  PHOS  --  3.7  --   --    Liver Function Tests:  Recent Labs Lab 03/18/16 1516  AST 16  ALT 8*  ALKPHOS 45  BILITOT 1.4*  PROT 6.4*  ALBUMIN 2.9*    Recent Labs Lab 03/18/16 1516  LIPASE 16   CBC:  Recent Labs Lab 03/18/16 1516 03/19/16 0435 03/20/16 0403  WBC 8.0 8.0 6.3  NEUTROABS 6.3  --   --   HGB 11.8* 12.2 11.1*  HCT 34.7* 35.6 32.7*  MCV 88.7 90.6 88.7  PLT 199 203 176     Studies: No results found.  Scheduled Meds: . amitriptyline  10 mg Oral QHS  . amLODipine  10 mg Oral Daily  . carvedilol  6.25 mg  Oral BID  . hydrALAZINE  25 mg Oral Q8H  . magnesium sulfate 1 - 4 g bolus IVPB  4 g Intravenous Once  . pantoprazole  40 mg Oral QHS   Continuous Infusions: . sodium chloride 60 mL/hr at 03/22/16 1320    Assessment/Plan:  1. Acute renal insufficiency. Continue IV fluid hydrationAt lower rate. Hold lisinopril and Lasix. Check BMP in the morning. 2. Essential hypertension. Continue Norvasc and Coreg. Add hydralazine to help better control blood pressure 3. Small bowel obstruction. This is a repeat episode for the patient. Surgical team following. Diet advanced today. 4. Severe hypomagnesemia- replace 4 g IV magnesium today and oral ulcer 5. Hypokalemia. Replace potassium orally 6. History of colon cancer  Code Status:     Code Status Orders         Start     Ordered   03/18/16 2045  Full code   Continuous     03/18/16 2044    Code Status History    Date Active Date Inactive Code Status Order ID Comments User Context   01/29/2016  7:01 PM 01/31/2016  6:56 PM Full Code [id number]  Jules Husbands, MD Inpatient   10/21/2015 12:08 PM 10/22/2015  5:20 PM Full Code [id number]  Demetrios Loll, MD Inpatient    Advance Directive Documentation        Most Recent Value   Type of Advance Directive  Living will, Healthcare Power of Attorney   Pre-existing out of facility DNR order (yellow form or pink MOST form)     "MOST" Form in Place?       Disposition Plan: As per surgery  Consultants:  Gen. surgery  Time spent: 24 minutes. Case discussed with general surgery.  Loletha Grayer  [employer]

## 2016-03-22 NOTE — Progress Notes (Signed)
Key Points: Use following P&T approved IV to PO antibiotic change policy.  Description contains the criteria that are approved Note: Policy Excludes:  Esophagectomy patientsPHARMACIST - PHYSICIAN COMMUNICATION  CONCERNING: IV to Oral Route Change Policy  RECOMMENDATION: This patient is receiving Pantoprazole by the intravenous route.  Based on criteria approved by the Pharmacy and Therapeutics Committee, the intravenous medication(s) is/are being converted to the equivalent oral dose form(s).   DESCRIPTION: These criteria include:  The patient is eating (either orally or via tube) and/or has been taking other orally administered medications for a least 24 hours  The patient has no evidence of active gastrointestinal bleeding or impaired GI absorption (gastrectomy, short bowel, patient on TNA or NPO).  If you have questions about this conversion, please contact the Pharmacy Department  []   [phone] )  [facility] [x]   [phone] )  [facility] []   [phone] )  [facility] []   [phone] )  [facility] []   [phone] )  [facility]   Paulina Fusi, PharmD, BCPS 03/22/2016 11:31 AM

## 2016-03-23 LAB — BASIC METABOLIC PANEL
ANION GAP: 7 (ref 5–15)
BUN: 22 mg/dL — ABNORMAL HIGH (ref 6–20)
CALCIUM: 7.8 mg/dL — AB (ref 8.9–10.3)
CO2: 21 mmol/L — ABNORMAL LOW (ref 22–32)
CREATININE: 1.39 mg/dL — AB (ref 0.44–1.00)
Chloride: 109 mmol/L (ref 101–111)
GFR, EST AFRICAN AMERICAN: 39 mL/min — AB (ref >60)
GFR, EST NON AFRICAN AMERICAN: 34 mL/min — AB (ref >60)
Glucose, Bld: 108 mg/dL — ABNORMAL HIGH (ref 65–99)
Potassium: 3.8 mmol/L (ref 3.5–5.1)
SODIUM: 137 mmol/L (ref 135–145)

## 2016-03-23 LAB — MAGNESIUM: MAGNESIUM: 2 mg/dL (ref 1.7–2.4)

## 2016-03-23 MED ORDER — HYDRALAZINE HCL 25 MG PO TABS: 25.0000 mg | ORAL_TABLET | Freq: Three times a day (TID) | ORAL | Status: AC

## 2016-03-23 NOTE — Progress Notes (Signed)
[facility] [place] NAME: [name]    MRN#:  [id number]  DATE OF BIRTH:  Mar 07, 1930  SUBJECTIVE:  Hospital Day: 4 days [name] is a 82 y.o. female presenting with Abdominal Pain .   Overnight events: No events NG tube removed tolerating oral diet Interval Events: No complaints states ready to go home  REVIEW OF SYSTEMS:  CONSTITUTIONAL: No fever, fatigue or weakness.  EYES: No blurred or double vision.  EARS, NOSE, AND THROAT: No tinnitus or ear pain.  RESPIRATORY: No cough, shortness of breath, wheezing or hemoptysis.  CARDIOVASCULAR: No chest pain, orthopnea, edema.  GASTROINTESTINAL: No nausea, vomiting, diarrhea or abdominal pain.  GENITOURINARY: No dysuria, hematuria.  ENDOCRINE: No polyuria, nocturia,  HEMATOLOGY: No anemia, easy bruising or bleeding SKIN: No rash or lesion. MUSCULOSKELETAL: No joint pain or arthritis.   NEUROLOGIC: No tingling, numbness, weakness.  PSYCHIATRY: No anxiety or depression.   DRUG ALLERGIES:  No Known Allergies  VITALS:  Blood pressure 168/59, pulse 81, temperature 98.3 F (36.8 C), temperature source Oral, resp. rate 20, height 5\' 5"  (1.651 m), weight 92.987 kg (205 lb), SpO2 99 %.  PHYSICAL EXAMINATION:  VITAL SIGNS: Filed Vitals:   03/22/16 2046 03/23/16 0358  BP: 166/60 168/59  Pulse: 76 81  Temp: 97.6 F (36.4 C) 98.3 F (36.8 C)  Resp: 18 20   GENERAL:82 y.o.female currently in no acute distress.  HEAD: Normocephalic, atraumatic.  EYES: Pupils equal, round, reactive to light. Extraocular muscles intact. No scleral icterus.  MOUTH: Moist mucosal membrane. Dentition intact. No abscess noted.  EAR, NOSE, THROAT: Clear without exudates. No external lesions.  NECK: Supple. No thyromegaly. No nodules. No JVD.  PULMONARY: Clear to ascultation, without wheeze rails or rhonci. No use of accessory muscles, Good respiratory effort. good air entry bilaterally CHEST:  Nontender to palpation.  CARDIOVASCULAR: S1 and S2. Regular rate and rhythm. No murmurs, rubs, or gallops. No edema. Pedal pulses 2+ bilaterally.  GASTROINTESTINAL: Soft, nontender, nondistended. No masses. Positive bowel sounds. No hepatosplenomegaly.  MUSCULOSKELETAL: No swelling, clubbing, or edema. Range of motion full in all extremities.  NEUROLOGIC: Cranial nerves II through XII are intact. No gross focal neurological deficits. Sensation intact. Reflexes intact.  SKIN: No ulceration, lesions, rashes, or cyanosis. Skin warm and dry. Turgor intact.  PSYCHIATRIC: Mood, affect within normal limits. The patient is awake, alert and oriented x 3. Insight, judgment intact.      LABORATORY PANEL:   CBC  Recent Labs Lab 03/20/16 0403  WBC 6.3  HGB 11.1*  HCT 32.7*  PLT 176   ------------------------------------------------------------------------------------------------------------------  Chemistries   Recent Labs Lab 03/18/16 1516  03/23/16 0617  NA 132*  < > 137  K 3.5  < > 3.8  CL 102  < > 109  CO2 17*  < > 21*  GLUCOSE 104*  < > 108*  BUN 32*  < > 22*  CREATININE 2.30*  < > 1.39*  CALCIUM 7.5*  < > 7.8*  MG  --   < > 2.0  AST 16  --   --   ALT 8*  --   --   ALKPHOS 45  --   --   BILITOT 1.4*  --   --   < > = values in this interval not displayed. ------------------------------------------------------------------------------------------------------------------  Cardiac Enzymes No results for input(s): TROPONINI in the last 168 hours. ------------------------------------------------------------------------------------------------------------------  RADIOLOGY:  No results found.  EKG:   Orders placed  or performed during the hospital encounter of 01/29/16  . EKG 12-Lead  . EKG 12-Lead  . EKG 12-Lead  . EKG 12-Lead    ASSESSMENT AND PLAN:   [name] is a 82 y.o. female presenting with Abdominal Pain . Admitted 03/18/2016 : Day #: 4 days 1. Acute  renal insufficiency. Improving will continue to hold Lasix lisinopril on discharge to be restarted by PCP 2. Essential hypertension. Continue Norvasc and Coreg. hydralazine to help better control blood pressure 3. Small bowel obstruction. Improved   All the records are reviewed and case discussed with Care Management/Social Workerr. Management plans discussed with the patient, family and they are in agreement.  CODE STATUS: full TOTAL TIME TAKING CARE OF THIS PATIENT: 28 minutes.   POSSIBLE D/C IN 1DAYS, DEPENDING ON CLINICAL CONDITION.   [name],  Karenann Cai.D on 03/23/2016 at 11:24 AM  Between 7am to 6pm - Pager - [phone]  After 6pm: House Pager: - [phone]  [facility]  Office  [phone]  CC: Primary care physician; Rusty Aus, MD

## 2016-03-23 NOTE — Discharge Instructions (Signed)
Small Bowel Obstruction °A small bowel obstruction means that something is blocking the small bowel. The small bowel is also called the small intestine. It is the long tube that connects the stomach to the colon. An obstruction will stop food and fluids from passing through the small bowel. Treatment depends on what is causing the problem and how bad the problem is. °HOME CARE °· Get a lot of rest. °· Follow your diet as told by your doctor. You may need to: °¨ Only drink clear liquids until you start to get better. °¨ Avoid solid foods as told by your doctor. °· Take over-the-counter and prescription medicines only as told by your doctor. °· Keep all follow-up visits as told by your doctor. This is important. °GET HELP IF: °· You have a fever. °· You have chills. °GET HELP RIGHT AWAY IF: °· You have pain or cramps that get worse. °· You throw up (vomit) blood. °· You have a feeling of being sick to your stomach (nausea) that does not go away. °· You cannot stop throwing up. °· You cannot drink fluids. °· You feel confused. °· You feel dry or thirsty (dehydrated). °· Your belly gets more bloated. °· You feel weak or you pass out (faint). °  °This information is not intended to replace advice given to you by your health care provider. Make sure you discuss any questions you have with your health care provider. °  °Document Released: 12/26/2004 Document Revised: 08/09/2015 Document Reviewed: 01/12/2015 °Elsevier Interactive Patient Education ©2016 [employer] ° °

## 2016-03-23 NOTE — Progress Notes (Signed)
Patient discharged home with family.  All discharge instructions reviewed and discharge paperwork given to patient.  Patient verbalized understanding.  IV removed in tact. Prescription given to patient with all questions and concerns addressed. Patient's husband at bedside for transfer home.

## 2016-03-23 NOTE — Progress Notes (Signed)
Patient to be discharged today - would continue to hold lasix/lisinopril until seen by pcp given AKI - BP meds updated on discharge orders -- full note to follow

## 2016-03-23 NOTE — Discharge Summary (Signed)
Patient ID: [name] MRN: [id number] DOB/AGE: Aug 15, 1930 82 y.o.  Admit date: 03/18/2016 Discharge date: 03/23/2016  Discharge Diagnoses:  Small bowel obstruction  Procedures Performed: NG tube decompression  Discharged Condition: good  Hospital Course: Patient admitted from the emergency department with a small bowel obstruction thought to be secondary to a peristomal hernia. Patient did require an NG tube for rest and decompression however she had resolution of her bowel obstruction without operative intervention. Patient also was noted to have acute kidney injury on admission and an internal medicine consult was obtained who assisted with management of her blood pressure and renal function. Her kidney function did gradually returned to baseline.  Discharge Orders:  discharge home  Disposition: 01-Home or Self Care  Discharge Medications:   Medication List    STOP taking these medications        furosemide 20 MG tablet  Commonly known as:  LASIX     lisinopril 20 MG tablet  Commonly known as:  PRINIVIL,ZESTRIL     POTASSIUM PO      TAKE these medications        amitriptyline 10 MG tablet  Commonly known as:  ELAVIL  Take 10 mg by mouth at bedtime.     amLODipine 10 MG tablet  Commonly known as:  NORVASC  Take 10 mg by mouth daily.     aspirin EC 81 MG tablet  Take 81 mg by mouth daily.     carvedilol 25 MG tablet  Commonly known as:  COREG  Take 37.5 mg by mouth 2 (two) times daily.     cholecalciferol 1000 units tablet  Commonly known as:  VITAMIN D  Take 1,000 Units by mouth daily.     hydrALAZINE 25 MG tablet  Commonly known as:  APRESOLINE  Take 1 tablet (25 mg total) by mouth every 8 (eight) hours.     ibuprofen 200 MG tablet  Commonly known as:  ADVIL,MOTRIN  Take 400 mg by mouth every 6 (six) hours as needed for headache or mild pain.     pantoprazole 40 MG tablet  Commonly known as:  PROTONIX  Take 40 mg by mouth daily.     traMADol 50 MG tablet  Commonly known as:  ULTRAM  Take 50 mg by mouth every 6 (six) hours as needed for moderate pain.     triamcinolone cream 0.1 %  Commonly known as:  KENALOG  Apply 1 application topically 2 (two) times daily as needed (for rash/itching).     vitamin B-12 1000 MCG tablet  Commonly known as:  CYANOCOBALAMIN  Take 1,000 mcg by mouth daily.         Follwup: Follow-up Information    Follow up with [facility]. Schedule an appointment as soon as possible for a visit in 5 days.   Specialty:  General Surgery   Why:  to discuss possible hernia repair   Contact information:   [address] [place] [phone]      Signed: Clayburn Pert 03/23/2016, 9:33 AM

## 2016-03-23 NOTE — Final Progress Note (Signed)
CC: Bowel obstruction Subjective: 82 year old female with bowel obstruction thought to be due to parastomal hernia. Has been completely symptom free now for 24 hours. She's been tolerating her diet, having ostomy function, ambulating without any abdominal pain or nausea. Desires to go home.  Objective: Vital signs in last 24 hours: Temp:  [97.6 F (36.4 C)-98.3 F (36.8 C)] 98.3 F (36.8 C) (04/22 0358) Pulse Rate:  [71-81] 81 (04/22 0358) Resp:  [18-20] 20 (04/22 0358) BP: (156-184)/(59-71) 168/59 mmHg (04/22 0358) SpO2:  [96 %-99 %] 99 % (04/22 0358) Last BM Date: 03/22/16  Intake/Output from previous day: 04/21 0701 - 04/22 0700 In: 2534.9 [P.O.:1480; I.V.:954.9; IV Piggyback:100] Out: 750 [Urine:750] Intake/Output this shift:    Physical exam:  Gen.: No acute distress Chest: Clear to auscultation Heart: Regular rate and rhythm Abdomen: Soft, nontender, nondistended. Ostomy present left lower quadrant is pink, patent and productive of stool and gas. Palpable parastomal hernia that is partially reducible on exam.  Lab Results: CBC  No results for input(s): WBC, HGB, HCT, PLT in the last 72 hours. BMET  Recent Labs  03/22/16 0542 03/23/16 0617  NA 139 137  K 3.5 3.8  CL 110 109  CO2 19* 21*  GLUCOSE 91 108*  BUN 24* 22*  CREATININE 1.51* 1.39*  CALCIUM 7.3* 7.8*   PT/INR No results for input(s): LABPROT, INR in the last 72 hours. ABG No results for input(s): PHART, HCO3 in the last 72 hours.  Invalid input(s): PCO2, PO2  Studies/Results: No results found.  Anti-infectives: Anti-infectives    Start     Dose/Rate Route Frequency Ordered Stop   03/18/16 2100  cefTRIAXone (ROCEPHIN) 1 g in dextrose 5 % 50 mL IVPB  Status:  Discontinued     1 g 100 mL/hr over 30 Minutes Intravenous Daily-1800 03/18/16 2044 03/21/16 1446   03/18/16 1730  cefTRIAXone (ROCEPHIN) 1 g in dextrose 5 % 50 mL IVPB     1 g 100 mL/hr over 30 Minutes Intravenous  Once 03/18/16 1717  03/18/16 2012      Assessment/Plan:  82 year old female admitted for small bowel obstruction thought to be related to parastomal hernia. Bowel obstruction is completely resolved. Patient also with acute kidney injury and hypertension that has been managed by internal medicine. Appreciate internal medicine assistance with her management. Plan for discharge home today with follow-up in the surgery clinic next week to discuss possible elective hernia repair. She'll need follow-up with her primary care doctor to assist with her medical management of her blood pressure and kidney function.  Toniann Dickerson T. Adonis Huguenin, MD, FACS  03/23/2016

## 2016-03-24 ENCOUNTER — Emergency Department: Payer: Medicare Other

## 2016-03-24 ENCOUNTER — Inpatient Hospital Stay
Admission: EM | Admit: 2016-03-24 | Discharge: 2016-03-26 | DRG: 393 | Disposition: A | Discharge status: Home Health | Payer: Medicare Other | Attending: General Surgery | Admitting: General Surgery

## 2016-03-24 ENCOUNTER — Encounter: Payer: Self-pay | Admitting: Emergency Medicine

## 2016-03-24 DIAGNOSIS — I10 Essential (primary) hypertension: Secondary | ICD-10-CM | POA: Diagnosis present

## 2016-03-24 DIAGNOSIS — K433 Parastomal hernia with obstruction, without gangrene: Secondary | ICD-10-CM | POA: Diagnosis present

## 2016-03-24 DIAGNOSIS — E876 Hypokalemia: Secondary | ICD-10-CM | POA: Diagnosis present

## 2016-03-24 DIAGNOSIS — N390 Urinary tract infection, site not specified: Secondary | ICD-10-CM | POA: Diagnosis not present

## 2016-03-24 DIAGNOSIS — Z7982 Long term (current) use of aspirin: Secondary | ICD-10-CM

## 2016-03-24 DIAGNOSIS — Z85038 Personal history of other malignant neoplasm of large intestine: Secondary | ICD-10-CM

## 2016-03-24 DIAGNOSIS — K56609 Unspecified intestinal obstruction, unspecified as to partial versus complete obstruction: Secondary | ICD-10-CM | POA: Diagnosis present

## 2016-03-24 DIAGNOSIS — R531 Weakness: Secondary | ICD-10-CM

## 2016-03-24 DIAGNOSIS — F329 Major depressive disorder, single episode, unspecified: Secondary | ICD-10-CM | POA: Diagnosis present

## 2016-03-24 DIAGNOSIS — N179 Acute kidney failure, unspecified: Secondary | ICD-10-CM | POA: Diagnosis present

## 2016-03-24 DIAGNOSIS — K5669 Other intestinal obstruction: Secondary | ICD-10-CM

## 2016-03-24 DIAGNOSIS — Z8719 Personal history of other diseases of the digestive system: Secondary | ICD-10-CM | POA: Diagnosis not present

## 2016-03-24 DIAGNOSIS — J189 Pneumonia, unspecified organism: Secondary | ICD-10-CM | POA: Diagnosis not present

## 2016-03-24 DIAGNOSIS — J690 Pneumonitis due to inhalation of food and vomit: Secondary | ICD-10-CM | POA: Diagnosis present

## 2016-03-24 DIAGNOSIS — K219 Gastro-esophageal reflux disease without esophagitis: Secondary | ICD-10-CM | POA: Diagnosis present

## 2016-03-24 DIAGNOSIS — Z79899 Other long term (current) drug therapy: Secondary | ICD-10-CM

## 2016-03-24 DIAGNOSIS — Z8249 Family history of ischemic heart disease and other diseases of the circulatory system: Secondary | ICD-10-CM | POA: Diagnosis not present

## 2016-03-24 DIAGNOSIS — Z933 Colostomy status: Secondary | ICD-10-CM | POA: Diagnosis not present

## 2016-03-24 DIAGNOSIS — Z0189 Encounter for other specified special examinations: Secondary | ICD-10-CM

## 2016-03-24 LAB — URINALYSIS COMPLETE WITH MICROSCOPIC (ARMC ONLY)
Bilirubin Urine: NEGATIVE
Glucose, UA: NEGATIVE mg/dL
Nitrite: NEGATIVE
PH: 5 (ref 5.0–8.0)
Protein, ur: >500 mg/dL — AB
Specific Gravity, Urine: 1.009 (ref 1.005–1.030)

## 2016-03-24 LAB — COMPREHENSIVE METABOLIC PANEL
ALK PHOS: 42 U/L (ref 38–126)
ALT: 8 U/L — AB (ref 14–54)
AST: 17 U/L (ref 15–41)
Albumin: 2.7 g/dL — ABNORMAL LOW (ref 3.5–5.0)
Anion gap: 11 (ref 5–15)
BUN: 22 mg/dL — ABNORMAL HIGH (ref 6–20)
CALCIUM: 8 mg/dL — AB (ref 8.9–10.3)
CO2: 19 mmol/L — AB (ref 22–32)
CREATININE: 1.61 mg/dL — AB (ref 0.44–1.00)
Chloride: 105 mmol/L (ref 101–111)
GFR calc non Af Amer: 28 mL/min — ABNORMAL LOW (ref >60)
GFR, EST AFRICAN AMERICAN: 33 mL/min — AB (ref >60)
GLUCOSE: 138 mg/dL — AB (ref 65–99)
Potassium: 4.1 mmol/L (ref 3.5–5.1)
SODIUM: 135 mmol/L (ref 135–145)
Total Bilirubin: 0.8 mg/dL (ref 0.3–1.2)
Total Protein: 6 g/dL — ABNORMAL LOW (ref 6.5–8.1)

## 2016-03-24 LAB — CBC
HCT: 37.8 % (ref 35.0–47.0)
HEMOGLOBIN: 12.8 g/dL (ref 12.0–16.0)
MCH: 30.6 pg (ref 26.0–34.0)
MCHC: 33.9 g/dL (ref 32.0–36.0)
MCV: 90.1 fL (ref 80.0–100.0)
Platelets: 203 10*3/uL (ref 150–440)
RBC: 4.19 MIL/uL (ref 3.80–5.20)
RDW: 12.7 % (ref 11.5–14.5)
WBC: 15.4 10*3/uL — ABNORMAL HIGH (ref 3.6–11.0)

## 2016-03-24 LAB — LIPASE, BLOOD: Lipase: 18 U/L (ref 11–51)

## 2016-03-24 MED ORDER — PANTOPRAZOLE SODIUM 40 MG PO TBEC: 40.0000 mg | DELAYED_RELEASE_TABLET | Freq: Every day | ORAL | Status: DC

## 2016-03-24 MED ORDER — ENOXAPARIN SODIUM 30 MG/0.3ML ~~LOC~~ SOLN
30.0000 mg | SUBCUTANEOUS | Status: DC
  Administered 2016-03-24 – 2016-03-25 (×2): 30 mg via SUBCUTANEOUS
  Filled 2016-03-24 (×2): qty 0.3

## 2016-03-24 MED ORDER — DIATRIZOATE MEGLUMINE & SODIUM 66-10 % PO SOLN
15.0000 mL | ORAL | Status: DC
  Filled 2016-03-24: qty 30

## 2016-03-24 MED ORDER — AMLODIPINE BESYLATE 10 MG PO TABS
10.0000 mg | ORAL_TABLET | Freq: Every day | ORAL | Status: DC
  Filled 2016-03-24: qty 1

## 2016-03-24 MED ORDER — HYDRALAZINE HCL 20 MG/ML IJ SOLN: 10.0000 mg | INTRAMUSCULAR | Status: DC | PRN

## 2016-03-24 MED ORDER — ONDANSETRON 8 MG PO TBDP: 4.0000 mg | ORAL_TABLET | Freq: Four times a day (QID) | ORAL | Status: DC | PRN

## 2016-03-24 MED ORDER — CARVEDILOL 25 MG PO TABS
37.5000 mg | ORAL_TABLET | Freq: Two times a day (BID) | ORAL | Status: DC
  Filled 2016-03-24: qty 1

## 2016-03-24 MED ORDER — DIPHENHYDRAMINE HCL 50 MG/ML IJ SOLN
12.5000 mg | Freq: Four times a day (QID) | INTRAMUSCULAR | Status: DC | PRN
  Administered 2016-03-24: 12.5 mg via INTRAVENOUS
  Filled 2016-03-24: qty 1

## 2016-03-24 MED ORDER — AMITRIPTYLINE HCL 10 MG PO TABS
10.0000 mg | ORAL_TABLET | Freq: Every day | ORAL | Status: DC
  Administered 2016-03-25: 10 mg via ORAL
  Filled 2016-03-24: qty 1

## 2016-03-24 MED ORDER — DIPHENHYDRAMINE HCL 12.5 MG/5ML PO ELIX: 12.5000 mg | ORAL_SOLUTION | Freq: Four times a day (QID) | ORAL | Status: DC | PRN

## 2016-03-24 MED ORDER — MORPHINE SULFATE (PF) 2 MG/ML IV SOLN: 2.0000 mg | INTRAVENOUS | Status: DC | PRN

## 2016-03-24 MED ORDER — PIPERACILLIN-TAZOBACTAM 3.375 G IVPB
3.3750 g | Freq: Three times a day (TID) | INTRAVENOUS | Status: DC
  Filled 2016-03-24 (×2): qty 50

## 2016-03-24 MED ORDER — SODIUM CHLORIDE 0.9 % IV SOLN
INTRAVENOUS | Status: DC
  Administered 2016-03-24 – 2016-03-25 (×3): via INTRAVENOUS

## 2016-03-24 MED ORDER — ONDANSETRON HCL 4 MG/2ML IJ SOLN
4.0000 mg | Freq: Once | INTRAMUSCULAR | Status: AC | PRN
  Administered 2016-03-24: 4 mg via INTRAVENOUS
  Filled 2016-03-24: qty 2

## 2016-03-24 MED ORDER — PIPERACILLIN-TAZOBACTAM 3.375 G IVPB
3.3750 g | Freq: Three times a day (TID) | INTRAVENOUS | Status: DC
Start: 2016-03-24 — End: 2016-03-26
  Administered 2016-03-24 – 2016-03-26 (×6): 3.375 g via INTRAVENOUS
  Filled 2016-03-24 (×8): qty 50

## 2016-03-24 MED ORDER — ONDANSETRON HCL 4 MG/2ML IJ SOLN
4.0000 mg | Freq: Four times a day (QID) | INTRAMUSCULAR | Status: DC | PRN
  Administered 2016-03-24: 4 mg via INTRAVENOUS
  Filled 2016-03-24: qty 2

## 2016-03-24 MED ORDER — LEVOFLOXACIN IN D5W 500 MG/100ML IV SOLN: 500.0000 mg | INTRAVENOUS | Status: DC

## 2016-03-24 MED ORDER — SODIUM CHLORIDE 0.9 % IV BOLUS (SEPSIS)
500.0000 mL | Freq: Once | INTRAVENOUS | Status: AC
Start: 2016-03-24 — End: 2016-03-24
  Administered 2016-03-24: 500 mL via INTRAVENOUS

## 2016-03-24 MED ORDER — ONDANSETRON HCL 4 MG/2ML IJ SOLN
4.0000 mg | Freq: Once | INTRAMUSCULAR | Status: AC
  Administered 2016-03-24: 4 mg via INTRAVENOUS
  Filled 2016-03-24: qty 2

## 2016-03-24 MED ORDER — PANTOPRAZOLE SODIUM 40 MG IV SOLR
40.0000 mg | Freq: Every day | INTRAVENOUS | Status: DC
  Administered 2016-03-24 – 2016-03-25 (×2): 40 mg via INTRAVENOUS
  Filled 2016-03-24 (×2): qty 40

## 2016-03-24 MED ORDER — HYDRALAZINE HCL 25 MG PO TABS: 25.0000 mg | ORAL_TABLET | Freq: Three times a day (TID) | ORAL | Status: DC

## 2016-03-24 MED ORDER — HYDRALAZINE HCL 20 MG/ML IJ SOLN
10.0000 mg | Freq: Four times a day (QID) | INTRAMUSCULAR | Status: DC | PRN
  Filled 2016-03-24 (×3): qty 1

## 2016-03-24 NOTE — H&P (Signed)
Patient ID: [name], female   DOB: 05-12-1930, 82 y.o.   MRN: [id number]  CC: Nausea and vomiting  HPI [name] is a 82 y.o. female who presents emergency department less than 24 hours after discharge from the hospital for small bowel obstruction. Patient reports she went home at approximately 6:00 last return of nausea and last night had multiple episodes of vomiting. She feels little better since being in the emergency room and receiving Zofran. She reports having a cough in addition to the episodes of vomiting. She's had continued ostomy output although less than usual. She denies any abdominal pain at this time. She denies any fevers, chills, chest pain, shortness of breath, dysuria or urinary frequency.  HPI  Past Medical History  Diagnosis Date  . Hypertension   . Hypercalcemia   . Malignant neoplasm (HCC)     of descending colon  . Colon cancer ([facility])   . Arthritis     Past Surgical History  Procedure Laterality Date  . Colostomy    . Cholecystectomy    . Cataracts    . Colon cancer surgery    Patient's colon cancer surgery was an APR for a colon and rectal tumor. She has a permanent end colostomy  Family History  Problem Relation Age of Onset  . CAD Brother   . Congestive Heart Failure Mother   . Breast cancer Daughter   . Alcohol abuse Son     Social History Social History  Substance Use Topics  . Smoking status: Never Smoker   . Smokeless tobacco: None  . Alcohol Use: No    No Known Allergies  No current facility-administered medications for this encounter.   Current Outpatient Prescriptions  Medication Sig Dispense Refill  . amitriptyline (ELAVIL) 10 MG tablet Take 10 mg by mouth at bedtime.   1  . amLODipine (NORVASC) 10 MG tablet Take 10 mg by mouth daily.  0  . aspirin EC 81 MG tablet Take 81 mg by mouth daily.    . carvedilol (COREG) 25 MG tablet Take 37.5 mg by mouth 2 (two) times daily.   0  . cholecalciferol (VITAMIN D) 1000 units  tablet Take 1,000 Units by mouth daily.    . furosemide (LASIX) 20 MG tablet Take 20 mg by mouth daily.  1  . hydrALAZINE (APRESOLINE) 25 MG tablet Take 1 tablet (25 mg total) by mouth every 8 (eight) hours. 90 tablet 0  . ibuprofen (ADVIL,MOTRIN) 200 MG tablet Take 400 mg by mouth every 6 (six) hours as needed for headache or mild pain.     . pantoprazole (PROTONIX) 40 MG tablet Take 40 mg by mouth daily.   0  . traMADol (ULTRAM) 50 MG tablet Take 50 mg by mouth every 6 (six) hours as needed for moderate pain.   0  . triamcinolone cream (KENALOG) 0.1 % Apply 1 application topically 2 (two) times daily as needed (for rash/itching).     . vitamin B-12 (CYANOCOBALAMIN) 1000 MCG tablet Take 1,000 mcg by mouth daily.       Review of Systems A Multi-point review of systems was asked and was negative except for the findings documented in the history of present illness  Physical Exam Blood pressure 157/61, pulse 80, temperature 97.6 F (36.4 C), temperature source Oral, resp. rate 18, height 5\' 7"  (1.702 m), weight 92.987 kg (205 lb), SpO2 92 %. CONSTITUTIONAL: Resting in bed in no acute distress. EYES: Pupils are equal, round, and reactive to  light, Sclera are non-icteric. EARS, NOSE, MOUTH AND THROAT: The oropharynx is clear. The oral mucosa is pink and moist. Hearing is intact to voice. LYMPH NODES:  Lymph nodes in the neck are normal. RESPIRATORY:  Lungs are coarse throughout the equal. There is normal respiratory effort, with equal breath sounds bilaterally, and without pathologic use of accessory muscles. CARDIOVASCULAR: Heart is regular without murmurs, gallops, or rubs. GI: The abdomen is soft, nontender, and minimally distended. Patient has an end colostomy in the left lower quadrant that is pink and productive of stool, there is a palpable parastomal hernia that is only partially reducible. There is no hepatosplenomegaly. There are normal bowel sounds in all quadrants. GU: Rectal  impossible secondary to no rectum.   MUSCULOSKELETAL: Normal muscle strength and tone. No cyanosis or edema.   SKIN: Turgor is good and there are no pathologic skin lesions or ulcers. NEUROLOGIC: Motor and sensation is grossly normal. Cranial nerves are grossly intact. PSYCH:  Oriented to person, place and time. Affect is normal.  Data Reviewed Labs and images reviewed. Labs concerning for leukocytosis of 15.4, and elevated creatinine of 1.6, a dirty urinalysis with bacteria seen, leukocyte esterase positive and numerous white blood cells. CT scan reviewed does appear to show a recurrent small bowel obstruction with dilated loops of small bowel going into the parastomal hernia and decompressed loops of small bowel distal to that. There is contrast within the rectum and stoma likely from her previous CT scan. She also appears to have a right lower lobe pneumonia on CT. I have personally reviewed the patient's imaging, laboratory findings and medical records.    Assessment    82 year old female with recurrent small bowel obstruction secondary to parastomal hernia as well as acute kidney injury, possible urinary tract infection, possible pneumonia versus aspiration pneumonia    Plan    82 year old female with recurrent small bowel obstruction secondary to parastomal hernia. Discussed with the patient this is her third bowel obstruction from this hernia in the last month and that it is likely time to contemplate surgical correction of this. This is an end colostomy after an abdominal perineal resection done greater than 30 years ago. Patient's presentation is also, located by a possible aspiration pneumonia as well as a possible urinary tract infection. Plan for admission to the inpatient unit, IV hydration, IV antibiotics. We will consult internal medicine for assistance with her acute kidney injury, possible infections, preoperative clearance. She will likely require a laparotomy to repair her  parastomal hernia with either a mesh repair of her peristomal hernia or an ostomy relocation. All questions answered to the patient's satisfaction.     Time spent with the patient was 60 minutes, with more than 50% of the time spent in face-to-face education, counseling and care coordination.     Clayburn Pert, MD FACS General Surgeon 03/24/2016, 11:06 AM

## 2016-03-24 NOTE — ED Notes (Addendum)
Pt to triage in wheelchair with c/o nausea and vomiting since 1800 4/22.  Pt was just discharged from [facility] yesterday morning for a small bowel obstruction.    Pt nauseous in triage but no emesis, pt reports dx of hernia at stoma and colostomy since 1990 s/p colon cancer.

## 2016-03-24 NOTE — ED Provider Notes (Signed)
[facility] Emergency Department Provider Note  ____________________________________________  Time seen: Approximately 8 AM  I have reviewed the triage vital signs and the nursing notes.   HISTORY  Chief Complaint Nausea and Emesis   HPI [name] is a 82 y.o. female with a recent history of a small bowel obstruction just discharged yesterday who is presenting today with nausea and one episode of vomiting last night. She is denying any pain at this time. Says that she has not used her Zofran at home. Has a colostomy secondary to colon cancer in the past.Reports only minimal output from her ostomy.   Past Medical History  Diagnosis Date  . Hypertension   . Hypercalcemia   . Malignant neoplasm (HCC)     of descending colon  . Colon cancer ([place])   . Arthritis     Patient Active Problem List   Diagnosis Date Noted  . Small bowel obstruction ([place]) 03/18/2016  . Intestinal adhesions with obstruction ([place])   . SBO (small bowel obstruction) ([place]) 01/29/2016  . Hypokalemia 10/21/2015  . Malignant hypertension 10/21/2015  . Weakness 10/21/2015    Past Surgical History  Procedure Laterality Date  . Colostomy    . Cholecystectomy    . Cataracts    . Colon cancer surgery      Current Outpatient Rx  Name  Route  Sig  Dispense  Refill  . amitriptyline (ELAVIL) 10 MG tablet   Oral   Take 10 mg by mouth at bedtime.       1   . amLODipine (NORVASC) 10 MG tablet   Oral   Take 10 mg by mouth daily.      0   . aspirin EC 81 MG tablet   Oral   Take 81 mg by mouth daily.         . carvedilol (COREG) 25 MG tablet   Oral   Take 37.5 mg by mouth 2 (two) times daily.       0   . cholecalciferol (VITAMIN D) 1000 units tablet   Oral   Take 1,000 Units by mouth daily.         . furosemide (LASIX) 20 MG tablet   Oral   Take 20 mg by mouth daily.      1   . hydrALAZINE (APRESOLINE) 25 MG tablet   Oral   Take 1 tablet (25 mg total)  by mouth every 8 (eight) hours.   90 tablet   0   . ibuprofen (ADVIL,MOTRIN) 200 MG tablet   Oral   Take 400 mg by mouth every 6 (six) hours as needed for headache or mild pain.          . pantoprazole (PROTONIX) 40 MG tablet   Oral   Take 40 mg by mouth daily.       0   . traMADol (ULTRAM) 50 MG tablet   Oral   Take 50 mg by mouth every 6 (six) hours as needed for moderate pain.       0   . triamcinolone cream (KENALOG) 0.1 %   Topical   Apply 1 application topically 2 (two) times daily as needed (for rash/itching).          . vitamin B-12 (CYANOCOBALAMIN) 1000 MCG tablet   Oral   Take 1,000 mcg by mouth daily.           Allergies Review of patient's allergies indicates no known allergies.  Family History  Problem Relation Age of Onset  . CAD Brother   . Congestive Heart Failure Mother   . Breast cancer Daughter   . Alcohol abuse Son     Social History Social History  Substance Use Topics  . Smoking status: Never Smoker   . Smokeless tobacco: None  . Alcohol Use: No    Review of Systems Constitutional: No fever/chills Eyes: No visual changes. ENT: No sore throat. Cardiovascular: Denies chest pain. Respiratory: Denies shortness of breath. Gastrointestinal: No abdominal pain.  No diarrhea.  No constipation. Genitourinary: Negative for dysuria. Musculoskeletal: Negative for back pain. Skin: Negative for rash. Neurological: Negative for headaches, focal weakness or numbness.  10-point ROS otherwise negative.  ____________________________________________   PHYSICAL EXAM:  VITAL SIGNS: ED Triage Vitals  Enc Vitals Group     BP 03/24/16 0543 166/54 mmHg     Pulse Rate 03/24/16 0543 75     Resp 03/24/16 0543 18     Temp 03/24/16 0543 97.6 F (36.4 C)     Temp Source 03/24/16 0543 Oral     SpO2 03/24/16 0543 94 %     Weight 03/24/16 0543 205 lb (92.987 kg)     Height 03/24/16 0543 5\' 7"  (1.702 m)     Head Cir --      Peak Flow --       Pain Score 03/24/16 0546 0     Pain Loc --      Pain Edu? --      Excl. in [place]? --     Constitutional: Alert and oriented. Well appearing and in no acute distress. Eyes: Conjunctivae are normal. PERRL. EOMI. Head: Atraumatic. Nose: No congestion/rhinnorhea. Mouth/Throat: Mucous membranes are moist.   Neck: No stridor.   Cardiovascular: Normal rate, regular rhythm. Grossly normal heart sounds.  Respiratory: Normal respiratory effort.  No retractions. Lungs CTAB. Gastrointestinal: Soft and nontender. No distention. No CVA tenderness. Musculoskeletal: No lower extremity tenderness nor edema.  No joint effusions. Neurologic:  Normal speech and language. No gross focal neurologic deficits are appreciated.  Skin:  Skin is warm, dry and intact. No rash noted. Psychiatric: Mood and affect are normal. Speech and behavior are normal.  ____________________________________________   LABS (all labs ordered are listed, but only abnormal results are displayed)  Labs Reviewed  COMPREHENSIVE METABOLIC PANEL - Abnormal; Notable for the following:    CO2 19 (*)    Glucose, Bld 138 (*)    BUN 22 (*)    Creatinine, Ser 1.61 (*)    Calcium 8.0 (*)    Total Protein 6.0 (*)    Albumin 2.7 (*)    ALT 8 (*)    GFR calc non Af Amer 28 (*)    GFR calc Af Amer 33 (*)    All other components within normal limits  CBC - Abnormal; Notable for the following:    WBC 15.4 (*)    All other components within normal limits  URINALYSIS COMPLETEWITH MICROSCOPIC ([facility] ONLY) - Abnormal; Notable for the following:    Color, Urine YELLOW (*)    APPearance HAZY (*)    Ketones, ur TRACE (*)    Hgb urine dipstick 1+ (*)    Protein, ur >500 (*)    Leukocytes, UA 3+ (*)    Bacteria, UA RARE (*)    Squamous Epithelial / LPF 0-5 (*)    All other components within normal limits  LIPASE, BLOOD   ____________________________________________  EKG   ____________________________________________  RADIOLOGY  DG  Abd 1 View (Final result) Result time: 03/24/16 07:40:46   Final result by Rad Results In Interface (03/24/16 07:40:46)   Narrative:   CLINICAL DATA: Nausea and vomiting since yesterday. Small bowel obstruction.  EXAM: ABDOMEN - 1 VIEW  COMPARISON: 03/20/2016  FINDINGS: Mildly increased dilatation of small bowel loops noted within the central abdomen. There is a paucity of colonic gas. Surgical clips are seen within the central pelvis and right upper quadrant of the abdomen. These findings are suspicious for small bowel obstruction.  IMPRESSION: Increased dilatation of small bowel loops within the central abdomen, suspicious for small bowel obstruction.   Electronically Signed By: Earle Gell M.D. On: 03/24/2016 07:40    IMPRESSION: 1. Small bowel obstruction secondary to small bowel entering the LEFT lower quadrant peristomal hernia. Findings similar to CT of 03/18/2016. No evidence of ischemia. 2. New RIGHT lower lobe pneumonia. Consider aspiration pneumonia. 3. RIGHT hydronephrosis and hydroureter without obstructing lesion identified. These results will be called to the ordering clinician or representative by the Radiologist Assistant, and communication documented in the PACS or zVision Dashboard.   Electronically Signed By: Suzy Bouchard M.D. ____________________________________________   PROCEDURES    ____________________________________________   INITIAL IMPRESSION / ASSESSMENT AND PLAN / ED COURSE  Pertinent labs & imaging results that were available during my care of the patient were reviewed by me and considered in my medical decision making (see chart for details).  Case Dr. Adonis Huguenin who recommends a CAT scan of the abdomen and pelvis.  ----------------------------------------- 10:49 AM on 03/24/2016 -----------------------------------------  Patient had a UTI, right lower lobe pneumonia as well as small bowel obstruction. Dr.  Adonis Huguenin has examined the patient the bedside and will be admitting the patient to the hospital. Reading the antibiotic orders. ____________________________________________   FINAL CLINICAL IMPRESSION(S) / ED DIAGNOSES  Final diagnoses:  Hx of small bowel obstruction  Small bowel obstruction. Right lower lobe pneumonia, hospital-acquired. Urinary tract infection.    Orbie Pyo, MD 03/24/16 1049

## 2016-03-24 NOTE — ED Notes (Signed)
Patient transported to X-ray 

## 2016-03-24 NOTE — Consult Note (Addendum)
[facility] HOSPITALIST  Medical Consultation  [name] [id number] DOB: [birth year] DOA: 03/24/2016 PCP: Rusty Aus, MD   Requesting physician: Dr. Clayburn Pert Date of consultation:  03/24/2016 Reason for consultation:  Opinion regarding leukocytosis, uti, pna, htn CHIEF COMPLAINT:   Chief Complaint  Patient presents with  . Nausea  . Emesis    HISTORY OF PRESENT ILLNESS: [name]  is a 82 y.o. female with a known history of  Hypertension, colon cancer, arthritis was just discharged from the surgical service yesterday for small bowel obstruction. Patient was at home overnight and started developing severe nausea and vomiting. She did not have much abdominal pain. Due to concern of recurrence of her small bowel obstruction she came back to the emergency room. She had a CT scan which confirmed small bowel obstruction. There was also concern for aspiration pneumonia. Patient also noticed to have acute kidney injury as well as leukocytosis. She also was noted to have a urinary tract infection as well. She denies any burning with urination or hesitancy. She has not had any fevers she doesn't complain of productive cough of yellow sputum PAST MEDICAL HISTORY:   Past Medical History  Diagnosis Date  . Hypertension   . Hypercalcemia   . Malignant neoplasm (HCC)     of descending colon  . Colon cancer ([place])   . Arthritis     PAST SURGICAL HISTORY: Past Surgical History  Procedure Laterality Date  . Colostomy    . Cholecystectomy    . Cataracts    . Colon cancer surgery      SOCIAL HISTORY:  Social History  Substance Use Topics  . Smoking status: Never Smoker   . Smokeless tobacco: Not on file  . Alcohol Use: No    FAMILY HISTORY:  Family History  Problem Relation Age of Onset  . CAD Brother   . Congestive Heart Failure Mother   . Breast cancer Daughter   . Alcohol abuse Son     DRUG ALLERGIES: No Known Allergies  REVIEW OF SYSTEMS:   CONSTITUTIONAL: No  fever, fatigue or weakness.  EYES: No blurred or double vision.  EARS, NOSE, AND THROAT: No tinnitus or ear pain.  RESPIRATORY: Positive cough, no shortness of breath, no wheezing or no hemoptysis.  CARDIOVASCULAR: No chest pain, orthopnea, edema.  GASTROINTESTINAL: Positive nausea, positive vomiting, no diarrhea or abdominal pain.  GENITOURINARY: No dysuria, hematuria.  ENDOCRINE: No polyuria, nocturia,  HEMATOLOGY: No anemia, easy bruising or bleeding SKIN: No rash or lesion. MUSCULOSKELETAL: No joint pain or arthritis.   NEUROLOGIC: No tingling, numbness, weakness.  PSYCHIATRY: No anxiety or depression.   MEDICATIONS AT HOME:  Prior to Admission medications   Medication Sig Start Date End Date Taking? Authorizing Provider  amitriptyline (ELAVIL) 10 MG tablet Take 10 mg by mouth at bedtime.    Yes Historical Provider, MD  amLODipine (NORVASC) 10 MG tablet Take 10 mg by mouth daily.   Yes Historical Provider, MD  aspirin EC 81 MG tablet Take 81 mg by mouth daily.   Yes Historical Provider, MD  carvedilol (COREG) 25 MG tablet Take 37.5 mg by mouth 2 (two) times daily.    Yes Historical Provider, MD  cholecalciferol (VITAMIN D) 1000 units tablet Take 1,000 Units by mouth daily.   Yes Historical Provider, MD  furosemide (LASIX) 20 MG tablet Take 20 mg by mouth daily. 01/24/16  Yes Historical Provider, MD  hydrALAZINE (APRESOLINE) 25 MG tablet Take 1 tablet (25 mg total) by mouth  every 8 (eight) hours. 03/23/16  Yes L[place]le [place], MD  ibuprofen (ADVIL,MOTRIN) 200 MG tablet Take 400 mg by mouth every 6 (six) hours as needed for headache or mild pain.    Yes Historical Provider, MD  pantoprazole (PROTONIX) 40 MG tablet Take 40 mg by mouth daily.    Yes Historical Provider, MD  traMADol (ULTRAM) 50 MG tablet Take 50 mg by mouth every 6 (six) hours as needed for moderate pain.    Yes Historical Provider, MD  triamcinolone cream (KENALOG) 0.1 % Apply 1 application topically 2 (two) times daily as  needed (for rash/itching).    Yes Historical Provider, MD  vitamin B-12 (CYANOCOBALAMIN) 1000 MCG tablet Take 1,000 mcg by mouth daily.   Yes Historical Provider, MD      PHYSICAL EXAMINATION:   VITAL SIGNS: Blood pressure 169/77, pulse 81, temperature 97.8 F (36.6 C), temperature source Oral, resp. rate 18, height 5\' 7"  (1.702 m), weight 92.987 kg (205 lb), SpO2 99 %.  GENERAL:  81 y.o.-year-old patient lying in the bed with no acute distress.  EYES: Pupils equal, round, reactive to light and accommodation. No scleral icterus. Extraocular muscles intact.  HEENT: Head atraumatic, normocephalic. Oropharynx and nasopharynx clear.  NECK:  Supple, no jugular venous distention. No thyroid enlargement, no tenderness.  LUNGS: Normal breath sounds bilaterally, no wheezing, rales,rhonchi or crepitation. No use of accessory muscles of respiration.  CARDIOVASCULAR: S1, S2 normal. No murmurs, rubs, or gallops.  ABDOMEN: Soft, nontender, nondistended. Bowel sounds present. Intact colostomy  EXTREMITIES: No pedal edema, cyanosis, or clubbing.  NEUROLOGIC: Cranial nerves II through XII are intact. Muscle strength 5/5 in all extremities. Sensation intact. Gait not checked.  PSYCHIATRIC: The patient is alert and oriented x 3.  SKIN: No obvious rash, lesion, or ulcer.   LABORATORY PANEL:   CBC  Recent Labs Lab 03/18/16 1516 03/19/16 0435 03/20/16 0403 03/24/16 0557  WBC 8.0 8.0 6.3 15.4*  HGB 11.8* 12.2 11.1* 12.8  HCT 34.7* 35.6 32.7* 37.8  PLT 199 203 176 203  MCV 88.7 90.6 88.7 90.1  MCH 30.2 31.0 30.1 30.6  MCHC 34.1 34.2 34.0 33.9  RDW 12.9 12.9 12.7 12.7  LYMPHSABS 0.7*  --   --   --   MONOABS 0.7  --   --   --   EOSABS 0.1  --   --   --   BASOSABS 0.1  --   --   --    ------------------------------------------------------------------------------------------------------------------  Chemistries   Recent Labs Lab 03/18/16 1516 03/19/16 0435 03/20/16 0403 03/22/16 0542  03/23/16 0617 03/24/16 0557  NA 132* 132* 134* 139 137 135  K 3.5 3.5 3.4* 3.5 3.8 4.1  CL 102 101 102 110 109 105  CO2 17* 19* 22 19* 21* 19*  GLUCOSE 104* 102* 85 91 108* 138*  BUN 32* 31* 28* 24* 22* 22*  CREATININE 2.30* 1.98* 1.96* 1.51* 1.39* 1.61*  CALCIUM 7.5* 7.4* 7.0* 7.3* 7.8* 8.0*  MG  --  1.1*  --  1.1* 2.0  --   AST 16  --   --   --   --  17  ALT 8*  --   --   --   --  8*  ALKPHOS 45  --   --   --   --  42  BILITOT 1.4*  --   --   --   --  0.8   ------------------------------------------------------------------------------------------------------------------ estimated creatinine clearance is 31 mL/min (by C-G formula based  on Cr of 1.61). ------------------------------------------------------------------------------------------------------------------ No results for input(s): TSH, T4TOTAL, T3FREE, THYROIDAB in the last 72 hours.  Invalid input(s): FREET3   Coagulation profile  Recent Labs Lab 03/19/16 0435  INR 1.16   ------------------------------------------------------------------------------------------------------------------- No results for input(s): DDIMER in the last 72 hours. -------------------------------------------------------------------------------------------------------------------  Cardiac Enzymes No results for input(s): CKMB, TROPONINI, MYOGLOBIN in the last 168 hours.  Invalid input(s): CK ------------------------------------------------------------------------------------------------------------------ Invalid input(s): POCBNP  ---------------------------------------------------------------------------------------------------------------  Urinalysis    Component Value Date/Time   COLORURINE YELLOW* 03/24/2016 0947   APPEARANCEUR HAZY* 03/24/2016 0947   LABSPEC 1.009 03/24/2016 0947   PHURINE 5.0 03/24/2016 0947   GLUCOSEU NEGATIVE 03/24/2016 0947   HGBUR 1+* 03/24/2016 [place] 03/24/2016 0947   KETONESUR  TRACE* 03/24/2016 0947   PROTEINUR >500* 03/24/2016 0947   NITRITE NEGATIVE 03/24/2016 0947   LEUKOCYTESUR 3+* 03/24/2016 0947     RADIOLOGY: Ct Abdomen Pelvis Wo Contrast  03/24/2016  CLINICAL DATA:  Susko is a 82 y.o. female who is well-known to the surgery service returns emergency department with complaints of nausea, vomiting,. Symptoms similar prior bowel obstruction. Patient history of colon cancer and peristomal hernia. EXAM: CT ABDOMEN AND PELVIS WITHOUT CONTRAST TECHNIQUE: Multidetector CT imaging of the abdomen and pelvis was performed following the standard protocol without IV contrast. COMPARISON:  CT 4/17 17 FINDINGS: Lower chest: There is new branching airspace consolidation and nodularity within the RIGHT lower lobe consistent with pneumonia or aspiration pneumonitis. Hepatobiliary:  Pneumobilia again noted.  Postcholecystectomy. Pancreas: Pancreas is normal. No ductal dilatation. No pancreatic inflammation. Spleen: Normal spleen Adrenals/urinary tract: Adrenal glands are normal. Again demonstrated RIGHT hydronephrosis and hydroureter. No obstructing lesion identified. RIGHT renal cortical lesion which is isodense to the renal parenchyma is indeterminate (Image 32, series 2). Bladder normal Stomach/Bowel: There is oral contrast within the stomach which is mildly distended. The proximal small bowel is distended with fluid measuring up to 3 cm. This pattern is very similar to CT of 03/18/2016. There is a LEFT lower quadrant peristomal hernia with loops of small bowel entering the hernia sac. The distal small bowel is decompressed to 1.3 cm (image 69, series 2). There is a contrast within the transverse colon and exiting the LEFT lower quadrant ostomy presumably from prior CT scan. No oral contrast enters the distal small bowel on current exam. There is no pneumatosis or portal venous gas. No intraperitoneal free air. Vascular/Lymphatic: Abdominal aorta is normal caliber with atherosclerotic  calcification. There is no retroperitoneal or periportal lymphadenopathy. No pelvic lymphadenopathy. Reproductive: Small uterus.  Ovaries not appreciated Other: No free fluid Musculoskeletal: No aggressive osseous lesion. IMPRESSION: 1. Small bowel obstruction secondary to small bowel entering the LEFT lower quadrant peristomal hernia. Findings similar to CT of 03/18/2016. No evidence of ischemia. 2. New RIGHT lower lobe pneumonia.  Consider aspiration pneumonia. 3. RIGHT hydronephrosis and hydroureter without obstructing lesion identified. These results will be called to the ordering clinician or representative by the Radiologist Assistant, and communication documented in the PACS or zVision Dashboard. Electronically Signed   By: Suzy Bouchard M.D.   On: 03/24/2016 10:37   Dg Abd 1 View  03/24/2016  CLINICAL DATA:  Nausea and vomiting since yesterday. Small bowel obstruction. EXAM: ABDOMEN - 1 VIEW COMPARISON:  03/20/2016 FINDINGS: Mildly increased dilatation of small bowel loops noted within the central abdomen. There is a paucity of colonic gas. Surgical clips are seen within the central pelvis and right upper quadrant of the abdomen. These findings are suspicious for  small bowel obstruction. IMPRESSION: Increased dilatation of small bowel loops within the central abdomen, suspicious for small bowel obstruction. Electronically Signed   By: Earle Gell M.D.   On: 03/24/2016 07:40    EKG: Orders placed or performed during the hospital encounter of 01/29/16  . EKG 12-Lead  . EKG 12-Lead  . EKG 12-Lead  . EKG 12-Lead    IMPRESSION AND PLAN: Patient is a 82 year old white female recently hospitalized for small bowel obstruction now readmitted for same  1. Preoperative evaluation; patient has no cardiac symptoms. She may have aspiration pneumonia. She has no hypoxia. Patient at moderate risk of surgery complications. No further workup needed.  2. Acute kidney injury: Prerenal in nature due to  patient having nausea vomiting we'll give her IV fluids follow renal function Patient also has incidental note of right hydronephrosis without any evidence of obstruction will need outpatient follow-up with urology for this. 3. LeukocytosisL: Due to combination of a UTI and then aspiration pneumonia patient is started on Zosyn. This could be narrowed down to Levaquin. This will both treat the UTI and aspiration pneumonia.  4. Hypertension: Patient is continued on her oral regimen with Norvasc, Coreg and hydralazine. I will place her on when necessary IV hydralazine if patient is to be complete nothing by mouth we can use other IV regimens to control her blood pressure.  5. Miscellaneous Lovenox for DVT prophylaxis   All the records are reviewed and case discussed with ED provider. Management plans discussed with the patient, family and they are in agreement.  CODE STATUS:    Code Status Orders        Start     Ordered   03/24/16 1257  Full code   Continuous     03/24/16 1256    Code Status History    Date Active Date Inactive Code Status Order ID Comments User Context   03/18/2016  8:44 PM 03/23/2016  2:21 PM Full Code [id number]  Clayburn Pert, MD Inpatient   01/29/2016  7:01 PM 01/31/2016  6:56 PM Full Code [id number]  Jules Husbands, MD Inpatient   10/21/2015 12:08 PM 10/22/2015  5:20 PM Full Code [id number]  Demetrios Loll, MD Inpatient    Advance Directive Documentation        Most Recent Value   Type of Advance Directive  Healthcare Power of Attorney, Living will   Pre-existing out of facility DNR order (yellow form or pink MOST form)     "MOST" Form in Place?         TOTAL TIME TAKING CARE OF THIS PATIENT: 55 minutes.    Dustin Flock M.D on 03/24/2016 at 3:12 PM  Between 7am to 6pm - Pager - [phone]  After 6pm go to [email or web address] - password [email or web address] [place] [facility]  Office  [phone]  CC: Primary care physician; Rusty Aus, MD

## 2016-03-25 ENCOUNTER — Inpatient Hospital Stay: Payer: Medicare Other

## 2016-03-25 DIAGNOSIS — Z8719 Personal history of other diseases of the digestive system: Secondary | ICD-10-CM

## 2016-03-25 DIAGNOSIS — K433 Parastomal hernia with obstruction, without gangrene: Secondary | ICD-10-CM | POA: Insufficient documentation

## 2016-03-25 LAB — BASIC METABOLIC PANEL
Anion gap: 13 (ref 5–15)
BUN: 20 mg/dL (ref 6–20)
CHLORIDE: 107 mmol/L (ref 101–111)
CO2: 18 mmol/L — AB (ref 22–32)
CREATININE: 1.58 mg/dL — AB (ref 0.44–1.00)
Calcium: 7.7 mg/dL — ABNORMAL LOW (ref 8.9–10.3)
GFR calc Af Amer: 34 mL/min — ABNORMAL LOW (ref >60)
GFR calc non Af Amer: 29 mL/min — ABNORMAL LOW (ref >60)
Glucose, Bld: 90 mg/dL (ref 65–99)
Potassium: 3.3 mmol/L — ABNORMAL LOW (ref 3.5–5.1)
Sodium: 138 mmol/L (ref 135–145)

## 2016-03-25 LAB — CBC
HEMATOCRIT: 31.6 % — AB (ref 35.0–47.0)
HEMOGLOBIN: 10.8 g/dL — AB (ref 12.0–16.0)
MCH: 30.6 pg (ref 26.0–34.0)
MCHC: 34.1 g/dL (ref 32.0–36.0)
MCV: 89.8 fL (ref 80.0–100.0)
Platelets: 155 10*3/uL (ref 150–440)
RBC: 3.53 MIL/uL — ABNORMAL LOW (ref 3.80–5.20)
RDW: 12.7 % (ref 11.5–14.5)
WBC: 11.2 10*3/uL — ABNORMAL HIGH (ref 3.6–11.0)

## 2016-03-25 LAB — APTT: aPTT: 38 seconds — ABNORMAL HIGH (ref 24–36)

## 2016-03-25 LAB — PHOSPHORUS: PHOSPHORUS: 3.4 mg/dL (ref 2.5–4.6)

## 2016-03-25 LAB — MAGNESIUM: Magnesium: 1.7 mg/dL (ref 1.7–2.4)

## 2016-03-25 LAB — PROTIME-INR
INR: 1.28
Prothrombin Time: 16.1 seconds — ABNORMAL HIGH (ref 11.4–15.0)

## 2016-03-25 MED ORDER — POTASSIUM CHLORIDE IN NACL 20-0.9 MEQ/L-% IV SOLN
INTRAVENOUS | Status: DC
  Administered 2016-03-25 – 2016-03-26 (×2): via INTRAVENOUS
  Filled 2016-03-25 (×4): qty 1000

## 2016-03-25 MED ORDER — HYDRALAZINE HCL 20 MG/ML IJ SOLN
10.0000 mg | Freq: Four times a day (QID) | INTRAMUSCULAR | Status: DC
  Administered 2016-03-25 – 2016-03-26 (×5): 10 mg via INTRAVENOUS
  Filled 2016-03-25 (×2): qty 1

## 2016-03-25 NOTE — Progress Notes (Signed)
[facility] [place] NAME: [name]    MR#:  [id number]  DATE OF BIRTH:  November 27, 1930  SUBJECTIVE:   Patient is here due to nausea and vomiting and noted to have a partial small bowel obstruction. Still having some mild abdominal pain but no nausea and vomiting. NG tube in place with some minimal drainage. Plan for possible surgical intervention tomorrow.  REVIEW OF SYSTEMS:    Review of Systems  Constitutional: Negative for fever and chills.  HENT: Negative for congestion and tinnitus.   Eyes: Negative for blurred vision and double vision.  Respiratory: Negative for cough, shortness of breath and wheezing.   Cardiovascular: Negative for chest pain, orthopnea and PND.  Gastrointestinal: Positive for abdominal pain. Negative for nausea, vomiting and diarrhea.  Genitourinary: Negative for dysuria and hematuria.  Neurological: Negative for dizziness, sensory change and focal weakness.  All other systems reviewed and are negative.   Nutrition: Nothing by mouth Tolerating Diet: No Tolerating PT: Await evaluation     DRUG ALLERGIES:  No Known Allergies  VITALS:  Blood pressure 176/55, pulse 89, temperature 98.2 F (36.8 C), temperature source Oral, resp. rate 17, height 5\' 7"  (1.702 m), weight 92.987 kg (205 lb), SpO2 94 %.  PHYSICAL EXAMINATION:   Physical Exam  GENERAL:  82 y.o.-year-old patient lying in the bed in no acute distress.  EYES: Pupils equal, round, reactive to light and accommodation. No scleral icterus. Extraocular muscles intact.  HEENT: Head atraumatic, normocephalic. Oropharynx and nasopharynx clear. NG tube in place with bilious drainage noted. NECK:  Supple, no jugular venous distention. No thyroid enlargement, no tenderness.  LUNGS: Normal breath sounds bilaterally, no wheezing, rales, rhonchi. No use of accessory muscles of respiration.  CARDIOVASCULAR: S1, S2 normal. No murmurs, rubs, or gallops.  ABDOMEN:  Soft, tender diffusely but no rebound, rigidity, nondistended. Bowel sounds present. No organomegaly or mass.  EXTREMITIES: No cyanosis, clubbing or edema b/l.    NEUROLOGIC: Cranial nerves II through XII are intact. No focal Motor or sensory deficits b/l.   PSYCHIATRIC: The patient is alert and oriented x 3.Good affect. SKIN: No obvious rash, lesion, or ulcer.    LABORATORY PANEL:   CBC  Recent Labs Lab 03/25/16 0522  WBC 11.2*  HGB 10.8*  HCT 31.6*  PLT 155   ------------------------------------------------------------------------------------------------------------------  Chemistries   Recent Labs Lab 03/24/16 0557 03/25/16 0522  NA 135 138  K 4.1 3.3*  CL 105 107  CO2 19* 18*  GLUCOSE 138* 90  BUN 22* 20  CREATININE 1.61* 1.58*  CALCIUM 8.0* 7.7*  MG  --  1.7  AST 17  --   ALT 8*  --   ALKPHOS 42  --   BILITOT 0.8  --    ------------------------------------------------------------------------------------------------------------------  Cardiac Enzymes No results for input(s): TROPONINI in the last 168 hours. ------------------------------------------------------------------------------------------------------------------  RADIOLOGY:  Ct Abdomen Pelvis Wo Contrast  03/24/2016  CLINICAL DATA:  Zukauskas is a 82 y.o. female who is well-known to the surgery service returns emergency department with complaints of nausea, vomiting,. Symptoms similar prior bowel obstruction. Patient history of colon cancer and peristomal hernia. EXAM: CT ABDOMEN AND PELVIS WITHOUT CONTRAST TECHNIQUE: Multidetector CT imaging of the abdomen and pelvis was performed following the standard protocol without IV contrast. COMPARISON:  CT 4/17 17 FINDINGS: Lower chest: There is new branching airspace consolidation and nodularity within the RIGHT lower lobe consistent with pneumonia or aspiration pneumonitis. Hepatobiliary:  Pneumobilia again noted.  Postcholecystectomy.  Pancreas: Pancreas is  normal. No ductal dilatation. No pancreatic inflammation. Spleen: Normal spleen Adrenals/urinary tract: Adrenal glands are normal. Again demonstrated RIGHT hydronephrosis and hydroureter. No obstructing lesion identified. RIGHT renal cortical lesion which is isodense to the renal parenchyma is indeterminate (Image 32, series 2). Bladder normal Stomach/Bowel: There is oral contrast within the stomach which is mildly distended. The proximal small bowel is distended with fluid measuring up to 3 cm. This pattern is very similar to CT of 03/18/2016. There is a LEFT lower quadrant peristomal hernia with loops of small bowel entering the hernia sac. The distal small bowel is decompressed to 1.3 cm (image 69, series 2). There is a contrast within the transverse colon and exiting the LEFT lower quadrant ostomy presumably from prior CT scan. No oral contrast enters the distal small bowel on current exam. There is no pneumatosis or portal venous gas. No intraperitoneal free air. Vascular/Lymphatic: Abdominal aorta is normal caliber with atherosclerotic calcification. There is no retroperitoneal or periportal lymphadenopathy. No pelvic lymphadenopathy. Reproductive: Small uterus.  Ovaries not appreciated Other: No free fluid Musculoskeletal: No aggressive osseous lesion. IMPRESSION: 1. Small bowel obstruction secondary to small bowel entering the LEFT lower quadrant peristomal hernia. Findings similar to CT of 03/18/2016. No evidence of ischemia. 2. New RIGHT lower lobe pneumonia.  Consider aspiration pneumonia. 3. RIGHT hydronephrosis and hydroureter without obstructing lesion identified. These results will be called to the ordering clinician or representative by the Radiologist Assistant, and communication documented in the PACS or zVision Dashboard. Electronically Signed   By: Suzy Bouchard M.D.   On: 03/24/2016 10:37   Dg Abd 1 View  03/24/2016  CLINICAL DATA:  Nausea and vomiting since yesterday. Small bowel  obstruction. EXAM: ABDOMEN - 1 VIEW COMPARISON:  03/20/2016 FINDINGS: Mildly increased dilatation of small bowel loops noted within the central abdomen. There is a paucity of colonic gas. Surgical clips are seen within the central pelvis and right upper quadrant of the abdomen. These findings are suspicious for small bowel obstruction. IMPRESSION: Increased dilatation of small bowel loops within the central abdomen, suspicious for small bowel obstruction. Electronically Signed   By: Earle Gell M.D.   On: 03/24/2016 07:40   Dg Abd Portable 2v  03/25/2016  CLINICAL DATA:  History of prior small-bowel obstruction. EXAM: PORTABLE ABDOMEN - 2 VIEW COMPARISON:  CT 03/24/2016. FINDINGS: NG tube noted with tip below left hemidiaphragm. Low lung volumes with mild basilar atelectasis. Persistent dilated loops of small bowel noted peer of oral contrast from prior CT noted in the colon. Surgical clips right upper quadrant and pelvis. Aortoiliac atherosclerotic vascular calcification. Degenerative changes lumbar spine and both hips. Changes of right lung base bronchiectasis . IMPRESSION: 1. NG tube noted with tip projected over the stomach. 2. Persistent small bowel dilatation consistent with small-bowel obstruction. Oral contrast from prior CT is noted in the colon. No free air. 3.  Changes of right lung base bronchiectasis noted . Electronically Signed   By: Marcello Moores  Register   On: 03/25/2016 07:21     ASSESSMENT AND PLAN:   82 year old female with past medical history of hypertension, osteoarthritis, hypercalcemia, history of colon cancer who presented to the hospital abdominal pain nausea vomiting and noted to have partial small bowel obstruction.  1. Partial small bowel obstruction-this is the cause of patient's abdominal pain nausea and vomiting. -Continue supportive care with NG tube decompression, pain control, IV fluids. -Appreciate surgical input and patient likely to go for surgery tomorrow as this is her  third hospitalization in the past month and a half for small bowel obstruction. -Continue further care as per general surgery  2. Essential hypertension-continue IV hydralazine. Hold oral meds as patient is nothing by mouth.  3. Hypokalemia-we'll supplement and repeat level in the morning. -Check magnesium level.  4. GERD-continue IV Protonix.  5. Urinary tract infection without hematuria-continue IV Zosyn. Follow urine cultures.  6. Depression-continue Elavil.   All the records are reviewed and case discussed with Care Management/Social Workerr. Management plans discussed with the patient, family and they are in agreement.  CODE STATUS: Full  DVT Prophylaxis: Lovenox  TOTAL TIME TAKING CARE OF THIS PATIENT: 25 minutes.   POSSIBLE D/C IN 1-2 DAYS, DEPENDING ON CLINICAL CONDITION.   Henreitta Leber M.D on 03/25/2016 at 2:23 PM  Between 7am to 6pm - Pager - [phone]  After 6pm go to [email or web address] - password [email or web address] [facility]  Office  [phone]  CC: Primary care physician; Rusty Aus, MD

## 2016-03-25 NOTE — Progress Notes (Signed)
CC: Peristomal hernia with obstruction Subjective: This patient had a nasogastric tube placed last night for nausea and vomiting and she feels much better today she is having some gas albeit very minimal as well as stool which is minimal and less than are normal. She has no abdominal pain. And overall feels better with a nasogastric tube in place. She denies any further nausea or vomiting.  Objective: Vital signs in last 24 hours: Temp:  [97.8 F (36.6 C)-98.3 F (36.8 C)] 98.2 F (36.8 C) (04/24 0429) Pulse Rate:  [76-89] 81 (04/24 0429) Resp:  [18-20] 18 (04/24 0429) BP: (154-180)/(50-77) 173/62 mmHg (04/24 0429) SpO2:  [92 %-99 %] 94 % (04/24 0429) Last BM Date: 03/24/16  Intake/Output from previous day: 04/23 0701 - 04/24 0700 In: 1566 [I.V.:1493; NG/GT:30; IV Piggyback:43] Out: 2600 [Urine:600; Emesis/NG output:2000] Intake/Output this shift: Total I/O In: -  Out: 200 [Urine:200]  Physical exam:  Patient awake alert oriented comfortable-appearing with nasogastric tube in place no scleral icterus no jaundice abdomen is minimally distended minimally tympanitic and essentially nontender there is stool in the bag with a pink mucosal ostomy and nontender peristomal hernia which is only partially reducible. Calves are nontender  Lab Results: CBC   Recent Labs  03/24/16 0557 03/25/16 0522  WBC 15.4* 11.2*  HGB 12.8 10.8*  HCT 37.8 31.6*  PLT 203 155   BMET  Recent Labs  03/24/16 0557 03/25/16 0522  NA 135 138  K 4.1 3.3*  CL 105 107  CO2 19* 18*  GLUCOSE 138* 90  BUN 22* 20  CREATININE 1.61* 1.58*  CALCIUM 8.0* 7.7*   PT/INR  Recent Labs  03/25/16 0522  LABPROT 16.1*  INR 1.28   ABG No results for input(s): PHART, HCO3 in the last 72 hours.  Invalid input(s): PCO2, PO2  Studies/Results: Ct Abdomen Pelvis Wo Contrast  03/24/2016  CLINICAL DATA:  Ninan is a 82 y.o. female who is well-known to the surgery service returns emergency department with  complaints of nausea, vomiting,. Symptoms similar prior bowel obstruction. Patient history of colon cancer and peristomal hernia. EXAM: CT ABDOMEN AND PELVIS WITHOUT CONTRAST TECHNIQUE: Multidetector CT imaging of the abdomen and pelvis was performed following the standard protocol without IV contrast. COMPARISON:  CT 4/17 17 FINDINGS: Lower chest: There is new branching airspace consolidation and nodularity within the RIGHT lower lobe consistent with pneumonia or aspiration pneumonitis. Hepatobiliary:  Pneumobilia again noted.  Postcholecystectomy. Pancreas: Pancreas is normal. No ductal dilatation. No pancreatic inflammation. Spleen: Normal spleen Adrenals/urinary tract: Adrenal glands are normal. Again demonstrated RIGHT hydronephrosis and hydroureter. No obstructing lesion identified. RIGHT renal cortical lesion which is isodense to the renal parenchyma is indeterminate (Image 32, series 2). Bladder normal Stomach/Bowel: There is oral contrast within the stomach which is mildly distended. The proximal small bowel is distended with fluid measuring up to 3 cm. This pattern is very similar to CT of 03/18/2016. There is a LEFT lower quadrant peristomal hernia with loops of small bowel entering the hernia sac. The distal small bowel is decompressed to 1.3 cm (image 69, series 2). There is a contrast within the transverse colon and exiting the LEFT lower quadrant ostomy presumably from prior CT scan. No oral contrast enters the distal small bowel on current exam. There is no pneumatosis or portal venous gas. No intraperitoneal free air. Vascular/Lymphatic: Abdominal aorta is normal caliber with atherosclerotic calcification. There is no retroperitoneal or periportal lymphadenopathy. No pelvic lymphadenopathy. Reproductive: Small uterus.  Ovaries not appreciated Other:  No free fluid Musculoskeletal: No aggressive osseous lesion. IMPRESSION: 1. Small bowel obstruction secondary to small bowel entering the LEFT lower  quadrant peristomal hernia. Findings similar to CT of 03/18/2016. No evidence of ischemia. 2. New RIGHT lower lobe pneumonia.  Consider aspiration pneumonia. 3. RIGHT hydronephrosis and hydroureter without obstructing lesion identified. These results will be called to the ordering clinician or representative by the Radiologist Assistant, and communication documented in the PACS or zVision Dashboard. Electronically Signed   By: Suzy Bouchard M.D.   On: 03/24/2016 10:37   Dg Abd 1 View  03/24/2016  CLINICAL DATA:  Nausea and vomiting since yesterday. Small bowel obstruction. EXAM: ABDOMEN - 1 VIEW COMPARISON:  03/20/2016 FINDINGS: Mildly increased dilatation of small bowel loops noted within the central abdomen. There is a paucity of colonic gas. Surgical clips are seen within the central pelvis and right upper quadrant of the abdomen. These findings are suspicious for small bowel obstruction. IMPRESSION: Increased dilatation of small bowel loops within the central abdomen, suspicious for small bowel obstruction. Electronically Signed   By: Earle Gell M.D.   On: 03/24/2016 07:40   Dg Abd Portable 2v  03/25/2016  CLINICAL DATA:  History of prior small-bowel obstruction. EXAM: PORTABLE ABDOMEN - 2 VIEW COMPARISON:  CT 03/24/2016. FINDINGS: NG tube noted with tip below left hemidiaphragm. Low lung volumes with mild basilar atelectasis. Persistent dilated loops of small bowel noted peer of oral contrast from prior CT noted in the colon. Surgical clips right upper quadrant and pelvis. Aortoiliac atherosclerotic vascular calcification. Degenerative changes lumbar spine and both hips. Changes of right lung base bronchiectasis . IMPRESSION: 1. NG tube noted with tip projected over the stomach. 2. Persistent small bowel dilatation consistent with small-bowel obstruction. Oral contrast from prior CT is noted in the colon. No free air. 3.  Changes of right lung base bronchiectasis noted . Electronically Signed   By:  Marcello Moores  Register   On: 03/25/2016 07:21    Anti-infectives: Anti-infectives    Start     Dose/Rate Route Frequency Ordered Stop   03/24/16 1600  piperacillin-tazobactam (ZOSYN) IVPB 3.375 g     3.375 g 12.5 mL/hr over 240 Minutes Intravenous Every 8 hours 03/24/16 1556     03/24/16 1530  levofloxacin (LEVAQUIN) IVPB 500 mg  Status:  Discontinued     500 mg 100 mL/hr over 60 Minutes Intravenous Every 24 hours 03/24/16 1522 03/24/16 1556   03/24/16 1400  piperacillin-tazobactam (ZOSYN) IVPB 3.375 g  Status:  Discontinued     3.375 g 12.5 mL/hr over 240 Minutes Intravenous Every 8 hours 03/24/16 1256 03/24/16 1522      Assessment/Plan:  CT scan and abdominal films are personally reviewed suggesting obstruction but there is contrast in the colon now suggesting partial obstruction. With this patient's history she is likely to require a parastomal hernia repair or relocation of her ostomy. I described these things for her and discussed with her the high risk of recurrence as well as the potential for transfer to a colorectal surgeon at [facility]. Patient wants to have surgery here if she needs it and at this point while I believe she will require surgery I'm willing to wait 1 more day to see if this improves if not surgery will be indicated in this patient.  Florene Glen, MD, FACS  03/25/2016

## 2016-03-25 NOTE — Progress Notes (Signed)
Isn't patient again after reviewing films personally and discussing with prime doc. She states she's feeling better passing some gas in her bag and some stool. Physical exam reveals a bag with minimal stool and minimal gas functional pink ostomy distended abdomen nontender Nontender calves  The KUB demonstrates to the colon suggesting a partial obstruction but this patient has recurred multiple times now and my recommendations are going to be for observation until tomorrow morning with repeating her films and if she is not dramatically improved I think that surgical intervention is necessary. I described for her the procedure itself the risk of recurrent hernia the risk of mesh placement and the risk of infection mesh infection and relocation of the ostomy is all reviewed for her she understood and agreed with this plan after checking films tomorrow. Her son will be or tomorrow for discussion as well.

## 2016-03-25 NOTE — Plan of Care (Signed)
Problem: Fluid Volume: Goal: Ability to maintain a balanced intake and output will improve Outcome: Not Progressing NPO

## 2016-03-26 ENCOUNTER — Inpatient Hospital Stay: Payer: Medicare Other

## 2016-03-26 LAB — CBC WITH DIFFERENTIAL/PLATELET
BASOS PCT: 0 %
Basophils Absolute: 0 10*3/uL (ref 0–0.1)
EOS PCT: 1 %
Eosinophils Absolute: 0.1 10*3/uL (ref 0–0.7)
HEMATOCRIT: 31.9 % — AB (ref 35.0–47.0)
Hemoglobin: 10.8 g/dL — ABNORMAL LOW (ref 12.0–16.0)
Lymphocytes Relative: 6 %
Lymphs Abs: 0.8 10*3/uL — ABNORMAL LOW (ref 1.0–3.6)
MCH: 30.3 pg (ref 26.0–34.0)
MCHC: 33.9 g/dL (ref 32.0–36.0)
MCV: 89.5 fL (ref 80.0–100.0)
MONO ABS: 0.9 10*3/uL (ref 0.2–0.9)
MONOS PCT: 7 %
NEUTROS ABS: 11.1 10*3/uL — AB (ref 1.4–6.5)
Neutrophils Relative %: 86 %
PLATELETS: 154 10*3/uL (ref 150–440)
RBC: 3.57 MIL/uL — ABNORMAL LOW (ref 3.80–5.20)
RDW: 12.6 % (ref 11.5–14.5)
WBC: 12.9 10*3/uL — ABNORMAL HIGH (ref 3.6–11.0)

## 2016-03-26 LAB — BASIC METABOLIC PANEL
Anion gap: 14 (ref 5–15)
BUN: 19 mg/dL (ref 6–20)
CALCIUM: 7.4 mg/dL — AB (ref 8.9–10.3)
CO2: 16 mmol/L — AB (ref 22–32)
CREATININE: 1.52 mg/dL — AB (ref 0.44–1.00)
Chloride: 109 mmol/L (ref 101–111)
GFR, EST AFRICAN AMERICAN: 35 mL/min — AB (ref >60)
GFR, EST NON AFRICAN AMERICAN: 31 mL/min — AB (ref >60)
GLUCOSE: 80 mg/dL (ref 65–99)
Potassium: 3 mmol/L — ABNORMAL LOW (ref 3.5–5.1)
Sodium: 139 mmol/L (ref 135–145)

## 2016-03-26 MED ORDER — ENSURE ENLIVE PO LIQD
237.0000 mL | Freq: Two times a day (BID) | ORAL | Status: DC
  Administered 2016-03-26: 237 mL via ORAL

## 2016-03-26 MED ORDER — ENSURE ENLIVE PO LIQD: 237.0000 mL | Freq: Two times a day (BID) | ORAL | Status: AC

## 2016-03-26 MED ORDER — AMOXICILLIN-POT CLAVULANATE 875-125 MG PO TABS: 1.0000 | ORAL_TABLET | Freq: Two times a day (BID) | ORAL | Status: AC

## 2016-03-26 MED ORDER — POTASSIUM CHLORIDE ER 10 MEQ PO TBCR: 10.0000 meq | EXTENDED_RELEASE_TABLET | Freq: Every day | ORAL | Status: AC

## 2016-03-26 NOTE — Evaluation (Signed)
Physical Therapy Evaluation Patient Details Name: [name] MRN: [id number] DOB: [birth year] Today's Date: 03/26/2016   History of Present Illness  Pt admitted for SBO secondary to periostomal hernia. Pt with recent hospitalization for similar symptoms. Pt with recent NG tube.  Clinical Impression  Pt is a pleasant 82 year old female who was admitted for SBO secondary to periostomal hernia. Pt performs transfers with supervision and ambulation with cga and rw. Pt demonstrates deficits with endurance/mobility/strength. Would benefit from skilled PT to address above deficits and promote optimal return to PLOF. Pt very motivated to perform therapy and is interested in having therapy in home to improve strength.      Follow Up Recommendations Home health PT    Equipment Recommendations       Recommendations for Other Services       Precautions / Restrictions Precautions Precautions: Fall Restrictions Weight Bearing Restrictions: No      Mobility  Bed Mobility               General bed mobility comments: not performed as pt received seated in recliner.   Transfers Overall transfer level: Needs assistance Equipment used: Rolling walker (2 wheeled) Transfers: Sit to/from Stand Sit to Stand: Supervision         General transfer comment: safe technique using rw. No LOB noted.  Ambulation/Gait Ambulation/Gait assistance: Min guard Ambulation Distance (Feet): 150 Feet Assistive device: Rolling walker (2 wheeled) Gait Pattern/deviations: Step-through pattern     General Gait Details: Pt ambulated using rw and safe technique. Pt performed reciprocal gait pattern.  Stairs            Wheelchair Mobility    Modified Rankin (Stroke Patients Only)       Balance Overall balance assessment: Modified Independent Sitting-balance support: Feet supported Sitting balance-Leahy Scale: Normal     Standing balance support: Bilateral upper extremity  supported Standing balance-Leahy Scale: Good                               Pertinent Vitals/Pain Pain Assessment: No/denies pain    Home Living Family/patient expects to be discharged to:: Private residence Living Arrangements: Spouse/significant other Available Help at Discharge: Family;Available 24 hours/day Type of Home: House Home Access: Stairs to enter Entrance Stairs-Rails: Right;Left;Can reach both Entrance Stairs-Number of Steps: 3 Home Layout: One level Home Equipment: Walker - 4 wheels;Walker - 2 wheels      Prior Function Level of Independence: Independent with assistive device(s)         Comments: Modified independent for ADL's; uses RW for household mobility and 4ww for community mobility; denies any falls in past 6 months     Hand Dominance        Extremity/Trunk Assessment   Upper Extremity Assessment: Overall WFL for tasks assessed           Lower Extremity Assessment: Generalized weakness (grossly 4/5)         Communication   Communication: No difficulties  Cognition Arousal/Alertness: Awake/alert Behavior During Therapy: WFL for tasks assessed/performed Overall Cognitive Status: Within Functional Limits for tasks assessed                      General Comments      Exercises Other Exercises Other Exercises: Pt performed seated ther-ex on B LE including ankle pumps, quad sets, and hip abd/add. All ther-ex performed x 10 reps on B LE with cues  for technique and supervision.      Assessment/Plan    PT Assessment Patient needs continued PT services  PT Diagnosis Generalized weakness   PT Problem List Decreased strength;Decreased activity tolerance;Decreased balance;Decreased mobility  PT Treatment Interventions DME instruction;Gait training;Stair training;Functional mobility training;Therapeutic activities;Therapeutic exercise;Balance training;Patient/family education   PT Goals (Current goals can be found in the  Care Plan section) Acute Rehab PT Goals Patient Stated Goal: to go home PT Goal Formulation: With patient Time For Goal Achievement: 04/09/16 Potential to Achieve Goals: Good    Frequency Min 2X/week   Barriers to discharge        Co-evaluation               End of Session Equipment Utilized During Treatment: Gait belt Activity Tolerance: Patient tolerated treatment well Patient left: in chair;with call bell/phone within reach;with chair alarm set Nurse Communication: Mobility status;Precautions         Time: 1100-1116 PT Time Calculation (min) (ACUTE ONLY): 16 min   Charges:   PT Evaluation $PT Eval Moderate Complexity: 1 Procedure PT Treatments $Therapeutic Exercise: 8-22 mins   PT G Codes:        Suriah Peragine 2016/04/04, 2:19 PM Greggory Stallion, PT, DPT [phone]

## 2016-03-26 NOTE — Progress Notes (Signed)
[facility] [place] NAME: [name]    MR#:  [id number]  DATE OF BIRTH:  [birth year]  SUBJECTIVE:   Patient is here due to nausea and vomiting and noted to have a partial small bowel obstruction. Feels a lot better and ab. Pain resolved.  No N/V and NG tube has been removed.  No other complaints.   REVIEW OF SYSTEMS:    Review of Systems  Constitutional: Negative for fever and chills.  HENT: Negative for congestion and tinnitus.   Eyes: Negative for blurred vision and double vision.  Respiratory: Negative for cough, shortness of breath and wheezing.   Cardiovascular: Negative for chest pain, orthopnea and PND.  Gastrointestinal: Negative for nausea, vomiting, abdominal pain and diarrhea.  Genitourinary: Negative for dysuria and hematuria.  Neurological: Negative for dizziness, sensory change and focal weakness.  All other systems reviewed and are negative.   Nutrition: Soft diet Tolerating Diet: Yes  DRUG ALLERGIES:  No Known Allergies  VITALS:  Blood pressure 178/51, pulse 94, temperature 97.8 F (36.6 C), temperature source Oral, resp. rate 20, height 5\' 7"  (1.702 m), weight 92.987 kg (205 lb), SpO2 97 %.  PHYSICAL EXAMINATION:   Physical Exam  GENERAL:  82 y.o.-year-old patient lying in the bed in no acute distress.  EYES: Pupils equal, round, reactive to light and accommodation. No scleral icterus. Extraocular muscles intact.  HEENT: Head atraumatic, normocephalic. Oropharynx and nasopharynx clear.  NECK:  Supple, no jugular venous distention. No thyroid enlargement, no tenderness.  LUNGS: Normal breath sounds bilaterally, no wheezing, rales, rhonchi. No use of accessory muscles of respiration.  CARDIOVASCULAR: S1, S2 normal. II/VI SEM at base, No rubs, or gallops.  ABDOMEN: Soft, non-tender, nondistended. Bowel sounds present. No organomegaly or mass. Positive colostomy in place with stool. EXTREMITIES: No cyanosis,  clubbing or edema b/l.    NEUROLOGIC: Cranial nerves II through XII are intact. No focal Motor or sensory deficits b/l.   PSYCHIATRIC: The patient is alert and oriented x 3.Good affect. SKIN: No obvious rash, lesion, or ulcer.    LABORATORY PANEL:   CBC  Recent Labs Lab 03/26/16 0253  WBC 12.9*  HGB 10.8*  HCT 31.9*  PLT 154   ------------------------------------------------------------------------------------------------------------------  Chemistries   Recent Labs Lab 03/24/16 0557 03/25/16 0522 03/26/16 0253  NA 135 138 139  K 4.1 3.3* 3.0*  CL 105 107 109  CO2 19* 18* 16*  GLUCOSE 138* 90 80  BUN 22* 20 19  CREATININE 1.61* 1.58* 1.52*  CALCIUM 8.0* 7.7* 7.4*  MG  --  1.7  --   AST 17  --   --   ALT 8*  --   --   ALKPHOS 42  --   --   BILITOT 0.8  --   --    ------------------------------------------------------------------------------------------------------------------  Cardiac Enzymes No results for input(s): TROPONINI in the last 168 hours. ------------------------------------------------------------------------------------------------------------------  RADIOLOGY:  Dg Abd 2 Views  03/26/2016  CLINICAL DATA:  Nausea vomiting. Abdominal pain. Small-bowel obstruction . EXAM: ABDOMEN - 2 VIEW COMPARISON:  03/25/2016.  CT 03/24/2016. FINDINGS: NG tube noted in stable position. Persistent dilated loops of small bowel consistent with small bowel obstruction again noted. Air and oral contrast noted in the colon. Surgical clips noted in the right upper quadrant and pelvis. Vascular calcification noted. Diffuse osteopenia degenerative change lumbar spine and both hips. Bibasilar atelectasis and/or infiltrates. Bronchiectasis in the lung bases may be present. IMPRESSION: 1. NG tube in stable  position. 2. Persistent dilated small bowel loops consistent with small bowel obstruction. Air and oral contrast noted in the colon. The colon is nondistended. No free air. 3.  Bibasilar atelectasis and/or infiltrates. Bronchiectasis in the lung bases may be present. Electronically Signed   By: Marcello Moores  Register   On: 03/26/2016 08:01   Dg Abd Portable 2v  03/25/2016  CLINICAL DATA:  History of prior small-bowel obstruction. EXAM: PORTABLE ABDOMEN - 2 VIEW COMPARISON:  CT 03/24/2016. FINDINGS: NG tube noted with tip below left hemidiaphragm. Low lung volumes with mild basilar atelectasis. Persistent dilated loops of small bowel noted peer of oral contrast from prior CT noted in the colon. Surgical clips right upper quadrant and pelvis. Aortoiliac atherosclerotic vascular calcification. Degenerative changes lumbar spine and both hips. Changes of right lung base bronchiectasis . IMPRESSION: 1. NG tube noted with tip projected over the stomach. 2. Persistent small bowel dilatation consistent with small-bowel obstruction. Oral contrast from prior CT is noted in the colon. No free air. 3.  Changes of right lung base bronchiectasis noted . Electronically Signed   By: Marcello Moores  Register   On: 03/25/2016 07:21     ASSESSMENT AND PLAN:   82 year old female with past medical history of hypertension, osteoarthritis, hypercalcemia, history of colon cancer who presented to the hospital abdominal pain nausea vomiting and noted to have partial small bowel obstruction.  1. Partial small bowel obstruction-this is the cause of patient's abdominal pain nausea and vomiting. -Much improved today. No abdominal pain, nausea vomiting. Positive stool and gas and colostomy. -NG tube has been removed and patient is tolerating a soft diet well without any further nausea or vomiting. -As per surgery likely to be discharged home later today. Likely if patient were to have an exacerbation she needs to be evaluated at a tertiary care center like [facility] or [place]. Pt. Is aware.   2. Essential hypertension-pt. Can resume her home meds upon discharge today.   3. Hypokalemia-potassium is 3.0 today. We'll place on  potassium supplements at discharge.  4. GERD-resume oral Protonix upon discharge.   5. Urinary tract infection without hematuria- on IV zosyn. Cultures (-) so far.  - o.k. To d/c on Oral Augmentin.   6. Depression-continue Elavil.  Discussed w/ Dr. Burt Knack and o.k. To discharge from medical standpoint on Oral Augmentin and potassium supplements.   All the records are reviewed and case discussed with Care Management/Social Workerr. Management plans discussed with the patient, family and they are in agreement.  CODE STATUS: Full  DVT Prophylaxis: Lovenox  TOTAL TIME TAKING CARE OF THIS PATIENT: 25 minutes.   POSSIBLE D/C IN 1-2 DAYS, DEPENDING ON CLINICAL CONDITION.   Henreitta Leber M.D on 03/26/2016 at 1:48 PM  Between 7am to 6pm - Pager - [phone]  After 6pm go to [email or web address] - password [email or web address] [facility]  Office  [phone]  CC: Primary care physician; Rusty Aus, MD

## 2016-03-26 NOTE — Discharge Instructions (Signed)
Maintain full liquid diet Take all home medications and new oral antibiotic as prescribed Follow up with [facility] colorectal surgery service to discuss repair of parastomal hernia

## 2016-03-26 NOTE — Care Management (Signed)
Plan for patient to discharge today.  Patient admitted with reccurent SBO.   Patient lives at home with her husband.  Patient son, and daughter in law at bedside.   Patient and family express concerns of weakness, and request PT evaluation.  Dr. Burt Knack gave verbal order for PT eval.  PT recommendation Home health PT.   Agency preference list give.  [facility] selected.  Contacted [facility], they are unable to accept patient due no coverage in which she lives.  Second choice [facility].  Referral was made to [facility].  Corene Cornea with [facility] notified of referral.  MD to place home health orders and complete face to face at time of discharge.

## 2016-03-26 NOTE — Progress Notes (Signed)
CC: Parastomal hernia Subjective: This patient with a peristomal hernia who today feels much better in fact she states that her output both in gas and in stool volume is back to normal. She has no abdominal pain no nausea vomiting and is back to her normal state.  Objective: Vital signs in last 24 hours: Temp:  [97.4 F (36.3 C)-97.5 F (36.4 C)] 97.4 F (36.3 C) (04/25 0542) Pulse Rate:  [84-90] 90 (04/25 0542) Resp:  [17-18] 17 (04/25 0542) BP: (166-178)/(50-82) 178/82 mmHg (04/25 0542) SpO2:  [94 %-96 %] 96 % (04/25 0542) Last BM Date: 03/24/16  Intake/Output from previous day: 04/24 0701 - 04/25 0700 In: 3113.3 [I.V.:3007.3; IV Piggyback:106] Out: [id number] [Urine:2150; Emesis/NG output:700; Stool:525] Intake/Output this shift: Total I/O In: -  Out: 100 [Urine:50; Emesis/NG output:50]  Physical exam:  Awake alert oriented Abdomen is soft nondistended nontympanitic and nontender the hernia is reducible and mostly reduced at this point there is a large amount of stool and gas in the ostomy bag. Calves are nontender no icterus no jaundice  Lab Results: CBC   Recent Labs  03/25/16 0522 03/26/16 0253  WBC 11.2* 12.9*  HGB 10.8* 10.8*  HCT 31.6* 31.9*  PLT 155 154   BMET  Recent Labs  03/25/16 0522 03/26/16 0253  NA 138 139  K 3.3* 3.0*  CL 107 109  CO2 18* 16*  GLUCOSE 90 80  BUN 20 19  CREATININE 1.58* 1.52*  CALCIUM 7.7* 7.4*   PT/INR  Recent Labs  03/25/16 0522  LABPROT 16.1*  INR 1.28   ABG No results for input(s): PHART, HCO3 in the last 72 hours.  Invalid input(s): PCO2, PO2  Studies/Results: Ct Abdomen Pelvis Wo Contrast  03/24/2016  CLINICAL DATA:  Thull is a 82 y.o. female who is well-known to the surgery service returns emergency department with complaints of nausea, vomiting,. Symptoms similar prior bowel obstruction. Patient history of colon cancer and peristomal hernia. EXAM: CT ABDOMEN AND PELVIS WITHOUT CONTRAST TECHNIQUE:  Multidetector CT imaging of the abdomen and pelvis was performed following the standard protocol without IV contrast. COMPARISON:  CT 4/17 17 FINDINGS: Lower chest: There is new branching airspace consolidation and nodularity within the RIGHT lower lobe consistent with pneumonia or aspiration pneumonitis. Hepatobiliary:  Pneumobilia again noted.  Postcholecystectomy. Pancreas: Pancreas is normal. No ductal dilatation. No pancreatic inflammation. Spleen: Normal spleen Adrenals/urinary tract: Adrenal glands are normal. Again demonstrated RIGHT hydronephrosis and hydroureter. No obstructing lesion identified. RIGHT renal cortical lesion which is isodense to the renal parenchyma is indeterminate (Image 32, series 2). Bladder normal Stomach/Bowel: There is oral contrast within the stomach which is mildly distended. The proximal small bowel is distended with fluid measuring up to 3 cm. This pattern is very similar to CT of 03/18/2016. There is a LEFT lower quadrant peristomal hernia with loops of small bowel entering the hernia sac. The distal small bowel is decompressed to 1.3 cm (image 69, series 2). There is a contrast within the transverse colon and exiting the LEFT lower quadrant ostomy presumably from prior CT scan. No oral contrast enters the distal small bowel on current exam. There is no pneumatosis or portal venous gas. No intraperitoneal free air. Vascular/Lymphatic: Abdominal aorta is normal caliber with atherosclerotic calcification. There is no retroperitoneal or periportal lymphadenopathy. No pelvic lymphadenopathy. Reproductive: Small uterus.  Ovaries not appreciated Other: No free fluid Musculoskeletal: No aggressive osseous lesion. IMPRESSION: 1. Small bowel obstruction secondary to small bowel entering the LEFT lower quadrant peristomal hernia.  Findings similar to CT of 03/18/2016. No evidence of ischemia. 2. New RIGHT lower lobe pneumonia.  Consider aspiration pneumonia. 3. RIGHT hydronephrosis and  hydroureter without obstructing lesion identified. These results will be called to the ordering clinician or representative by the Radiologist Assistant, and communication documented in the PACS or zVision Dashboard. Electronically Signed   By: Suzy Bouchard M.D.   On: 03/24/2016 10:37   Dg Abd 2 Views  03/26/2016  CLINICAL DATA:  Nausea vomiting. Abdominal pain. Small-bowel obstruction . EXAM: ABDOMEN - 2 VIEW COMPARISON:  03/25/2016.  CT 03/24/2016. FINDINGS: NG tube noted in stable position. Persistent dilated loops of small bowel consistent with small bowel obstruction again noted. Air and oral contrast noted in the colon. Surgical clips noted in the right upper quadrant and pelvis. Vascular calcification noted. Diffuse osteopenia degenerative change lumbar spine and both hips. Bibasilar atelectasis and/or infiltrates. Bronchiectasis in the lung bases may be present. IMPRESSION: 1. NG tube in stable position. 2. Persistent dilated small bowel loops consistent with small bowel obstruction. Air and oral contrast noted in the colon. The colon is nondistended. No free air. 3. Bibasilar atelectasis and/or infiltrates. Bronchiectasis in the lung bases may be present. Electronically Signed   By: Marcello Moores  Register   On: 03/26/2016 08:01   Dg Abd Portable 2v  03/25/2016  CLINICAL DATA:  History of prior small-bowel obstruction. EXAM: PORTABLE ABDOMEN - 2 VIEW COMPARISON:  CT 03/24/2016. FINDINGS: NG tube noted with tip below left hemidiaphragm. Low lung volumes with mild basilar atelectasis. Persistent dilated loops of small bowel noted peer of oral contrast from prior CT noted in the colon. Surgical clips right upper quadrant and pelvis. Aortoiliac atherosclerotic vascular calcification. Degenerative changes lumbar spine and both hips. Changes of right lung base bronchiectasis . IMPRESSION: 1. NG tube noted with tip projected over the stomach. 2. Persistent small bowel dilatation consistent with small-bowel  obstruction. Oral contrast from prior CT is noted in the colon. No free air. 3.  Changes of right lung base bronchiectasis noted . Electronically Signed   By: Marcello Moores  Register   On: 03/25/2016 07:21    Anti-infectives: Anti-infectives    Start     Dose/Rate Route Frequency Ordered Stop   03/26/16 0000  amoxicillin-clavulanate (AUGMENTIN) 875-125 MG tablet     1 tablet Oral 2 times daily 03/26/16 0824     03/24/16 1600  piperacillin-tazobactam (ZOSYN) IVPB 3.375 g     3.375 g 12.5 mL/hr over 240 Minutes Intravenous Every 8 hours 03/24/16 1556     03/24/16 1530  levofloxacin (LEVAQUIN) IVPB 500 mg  Status:  Discontinued     500 mg 100 mL/hr over 60 Minutes Intravenous Every 24 hours 03/24/16 1522 03/24/16 1556   03/24/16 1400  piperacillin-tazobactam (ZOSYN) IVPB 3.375 g  Status:  Discontinued     3.375 g 12.5 mL/hr over 240 Minutes Intravenous Every 8 hours 03/24/16 1256 03/24/16 1522      Assessment/Plan:  I had about a 30 minute discussion with the patient and multiple family members concerning the patient's care. She has had a recurrence of her symptoms multiple times over the last few weeks and now is completely resolved. She has a small bowel obstruction secondary to a peristomal hernia which is completely resolved. She require surgery and I was prepared to perform this urgently today had she not been resolved but today her pain is gone she is passing normal amounts of stool and gas therefore her nasogastric tube will be removed this morning.  I discussed with them that an urgent or emergent surgery could be performed by me here at [facility] however the risk of complications and the risk of recurrence would be quite high and would be improved by performance of this operation by a qualified colorectal surgeon here [place] or [facility]. The family wants to go to [facility] colorectal surgery service and if the patient tolerates nasogastric tube removal and clear liquid diet then she  will be transferred as an outpatient i.e. discharge today for follow-up with colorectal surgery service in the near future. I also discussed with family that it should this recur between now and when she sees the colorectal surgeons that coming back to [facility] may not be in the patient's best interest and he understood this plan.  Florene Glen, MD, FACS  03/26/2016

## 2016-03-26 NOTE — Progress Notes (Signed)
03/26/2016   16:15  [name] to be D/C'd Home per MD order.  Discussed prescriptions and follow up appointments with the patient. Prescriptions given to patient, medication list explained in detail. Pt verbalized understanding.    Medication List    TAKE these medications        amitriptyline 10 MG tablet  Commonly known as:  ELAVIL  Take 10 mg by mouth at bedtime.     amLODipine 10 MG tablet  Commonly known as:  NORVASC  Take 10 mg by mouth daily.     amoxicillin-clavulanate 875-125 MG tablet  Commonly known as:  AUGMENTIN  Take 1 tablet by mouth 2 (two) times daily.     aspirin EC 81 MG tablet  Take 81 mg by mouth daily.     carvedilol 25 MG tablet  Commonly known as:  COREG  Take 37.5 mg by mouth 2 (two) times daily.     cholecalciferol 1000 units tablet  Commonly known as:  VITAMIN D  Take 1,000 Units by mouth daily.     feeding supplement (ENSURE ENLIVE) Liqd  Take 237 mLs by mouth 2 (two) times daily between meals.     furosemide 20 MG tablet  Commonly known as:  LASIX  Take 20 mg by mouth daily.     hydrALAZINE 25 MG tablet  Commonly known as:  APRESOLINE  Take 1 tablet (25 mg total) by mouth every 8 (eight) hours.     ibuprofen 200 MG tablet  Commonly known as:  ADVIL,MOTRIN  Take 400 mg by mouth every 6 (six) hours as needed for headache or mild pain.     pantoprazole 40 MG tablet  Commonly known as:  PROTONIX  Take 40 mg by mouth daily.     potassium chloride 10 MEQ tablet  Commonly known as:  K-DUR  Take 1 tablet (10 mEq total) by mouth daily.     traMADol 50 MG tablet  Commonly known as:  ULTRAM  Take 50 mg by mouth every 6 (six) hours as needed for moderate pain.     triamcinolone cream 0.1 %  Commonly known as:  KENALOG  Apply 1 application topically 2 (two) times daily as needed (for rash/itching).     vitamin B-12 1000 MCG tablet  Commonly known as:  CYANOCOBALAMIN  Take 1,000 mcg by mouth daily.        Filed Vitals:   03/26/16 0912 03/26/16 1312  BP: 174/53 178/51  Pulse: 94 94  Temp:  97.8 F (36.6 C)  Resp: 19 20    Skin clean, dry and intact without evidence of skin break down, no evidence of skin tears noted. IV catheter discontinued intact. Site without signs and symptoms of complications. Dressing and pressure applied. Pt denies pain at this time. No complaints noted.  An After Visit Summary was printed and given to the patient. Patient escorted via [place], and D/C home via private auto.  Dola Argyle

## 2016-03-26 NOTE — Care Management Important Message (Signed)
Important Message  Patient Details  Name: [name] MRN: [id number] Date of Birth: 08-26-1930   Medicare Important Message Given:  Yes    Juliann Pulse A Yareliz Thorstenson 03/26/2016, 10:56 AM

## 2016-03-26 NOTE — Discharge Summary (Signed)
Physician Discharge Summary  Patient ID: [name] MRN: [id number] DOB/AGE: 01-04-1930 82 y.o.  Admit date: 03/24/2016 Discharge date: 03/26/2016   Discharge Diagnoses:  Active Problems:   Small bowel obstruction (HCC)   Hx of small bowel obstruction   Parastomal hernia with obstruction and without gangrene   Proceduresnone  Hospital Course: This a patient with small bowel obstruction secondary to a parastomal hernia. This has recurred multiple times over the last few weeks requiring hospitalizations. At this hospitalization the patient experienced signs of a partial small bowel obstruction which has resolved she is placing this normal amount of stool and gas into her bag and her abdominal pain has completely resolved she has no nausea or vomiting her nasogastric tube is been removed and she is tolerating a full liquid diet. I had discussed previously this morning and again with the patient later in the day the need for referral to colorectal surgery service either [facility] or [facility] and they chose [facility] these arrangements have been made. I rationale for having him see colorectal surgery service is that this is a complex difficult parastomal hernia and that the colorectal surgeons have much more expertise at repair of this with lower recurrence risk than would be had in my experience.   Family understood and agreed with this plan and they will follow up with [place] on an as-needed basis and I encouraged him to go to [facility] for colorectal surgery services and possibly to the [facility] emergency room should this recur in the interim.  Consults: PD  Disposition: 01-Home or Self Care  Discharge Instructions    Ambulatory referral to Physical Therapy    Complete by:  As directed   Home evaluation for physical therapy and strengthening  Iontophoresis - 4 mg/ml of dexamethasone:  No  T.E.N.S. Unit Evaluation and Dispense as Indicated:  No            Medication List     TAKE these medications        amitriptyline 10 MG tablet  Commonly known as:  ELAVIL  Take 10 mg by mouth at bedtime.     amLODipine 10 MG tablet  Commonly known as:  NORVASC  Take 10 mg by mouth daily.     amoxicillin-clavulanate 875-125 MG tablet  Commonly known as:  AUGMENTIN  Take 1 tablet by mouth 2 (two) times daily.     aspirin EC 81 MG tablet  Take 81 mg by mouth daily.     carvedilol 25 MG tablet  Commonly known as:  COREG  Take 37.5 mg by mouth 2 (two) times daily.     cholecalciferol 1000 units tablet  Commonly known as:  VITAMIN D  Take 1,000 Units by mouth daily.     furosemide 20 MG tablet  Commonly known as:  LASIX  Take 20 mg by mouth daily.     hydrALAZINE 25 MG tablet  Commonly known as:  APRESOLINE  Take 1 tablet (25 mg total) by mouth every 8 (eight) hours.     ibuprofen 200 MG tablet  Commonly known as:  ADVIL,MOTRIN  Take 400 mg by mouth every 6 (six) hours as needed for headache or mild pain.     pantoprazole 40 MG tablet  Commonly known as:  PROTONIX  Take 40 mg by mouth daily.     traMADol 50 MG tablet  Commonly known as:  ULTRAM  Take 50 mg by mouth every 6 (six) hours as needed for moderate  pain.     triamcinolone cream 0.1 %  Commonly known as:  KENALOG  Apply 1 application topically 2 (two) times daily as needed (for rash/itching).     vitamin B-12 1000 MCG tablet  Commonly known as:  CYANOCOBALAMIN  Take 1,000 mcg by mouth daily.           Follow-up Information    Follow up with [facility] colorectal service. Schedule an appointment as soon as possible for a visit in 1 week.   Why:  If symptoms worsen, follow up      Florene Glen, MD, FACS

## 2016-03-26 NOTE — Progress Notes (Signed)
Visited patient she tolerated full liquid diet without difficulty no nausea vomiting no abdominal pain and is continuing to pass stool into her bag with gas without difficulty.  Examination unchanged from previous visit.  Patient doing very well. Recommend discharge at this point for follow-up with [facility] colorectal surgery for repair of parastomal hernia at a later date. Her small bowel obstruction is currently resolved and her KUB has been personally reviewed showing gas throughout her colon. Family was present at this visit but I had had a long discussion earlier and I reviewed for the patient again my rationale for recommending colorectal surgery services for this complicated parastomal hernia as opposed to repair here at [facility]. We discussed happen if this were to recur and if it does I suggested that she be seen at the [facility] emergency room as opposed to [facility] so that it could be repaired and a more timely manner with proper surgical expertise. Patient understood all this and agreed with this plan

## 2017-06-17 IMAGING — MR MR LUMBAR SPINE W/O CM
4 of 5 series · 14 of 48 positions shown · non-contrast
Comparison: None.

CLINICAL DATA: Low back pain for 2 years. No reported leg symptoms.

EXAM:
MRI LUMBAR SPINE WITHOUT CONTRAST
TECHNIQUE: Multiplanar, multisequence MR imaging of the lumbar spine was
performed. No intravenous contrast was administered.

[Series 2: T2 · sagittal · 4.0mm · 0.44mm/px · 5 of 15 slices shown (1 of 2)]
[im 1/15]
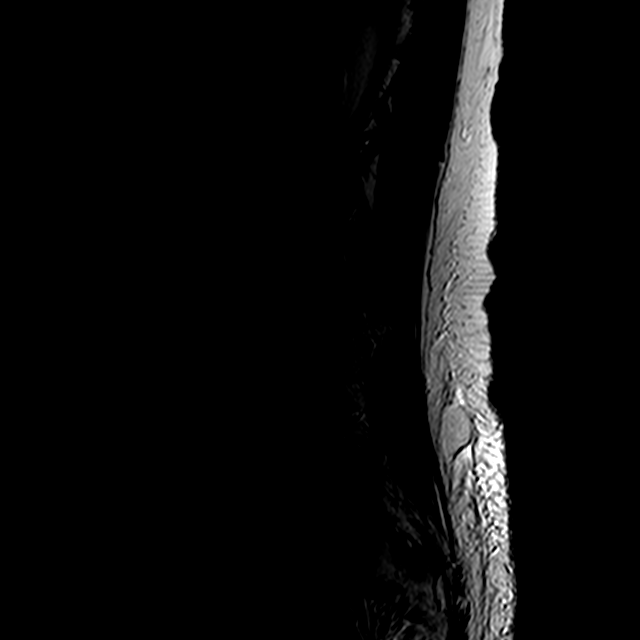
[im 3/15]
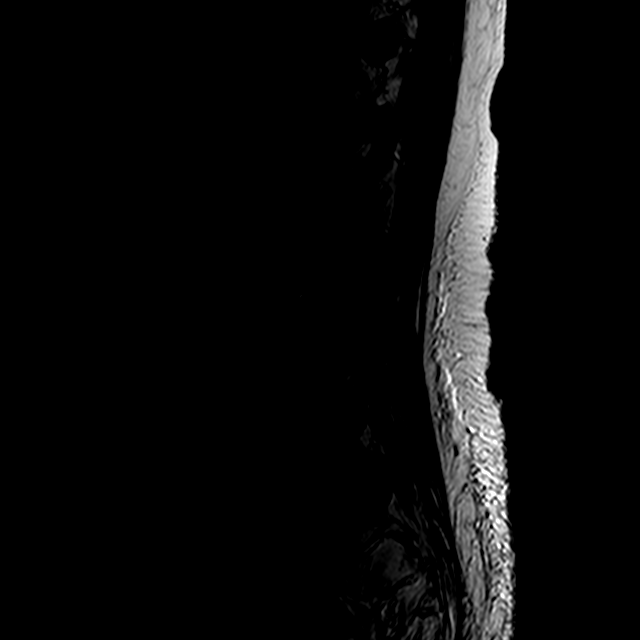
[im 6/15]
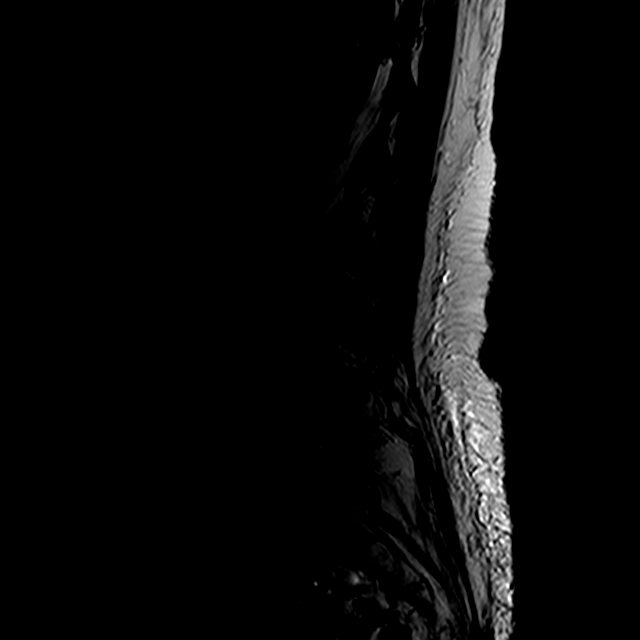
[im 9/15]
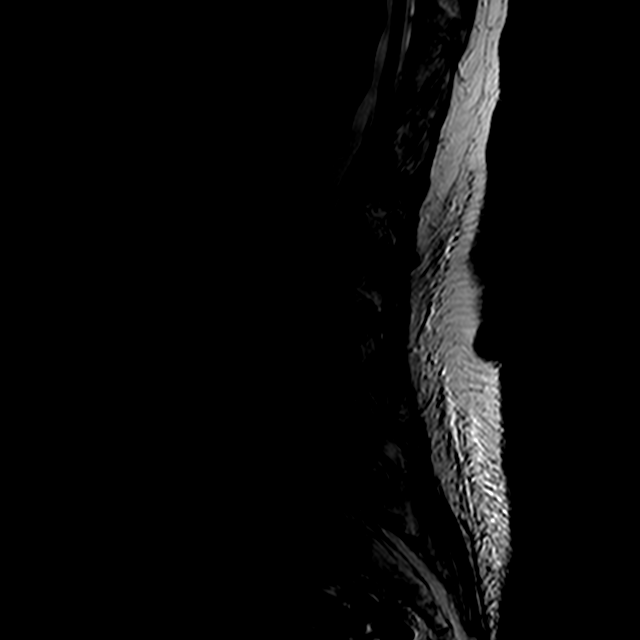
[im 15/15]
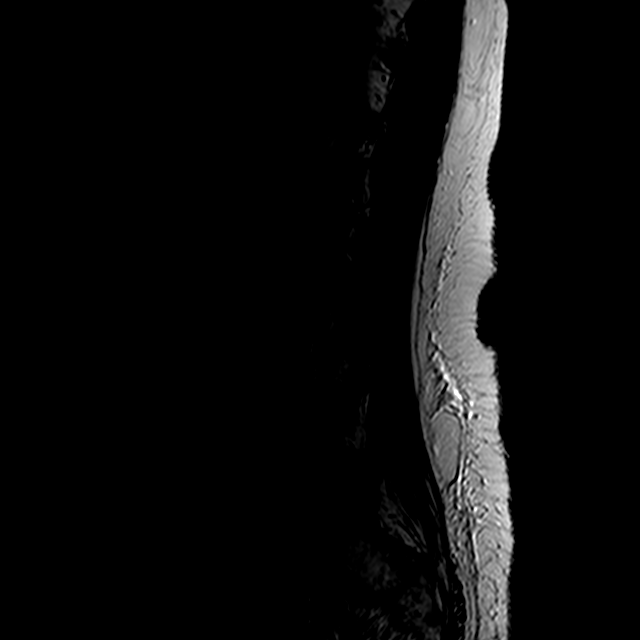

[Series 3: T1 · sagittal · 4.0mm · 0.44mm/px · 3 of 15 slices shown (1 of 2)]
[im 3/15]
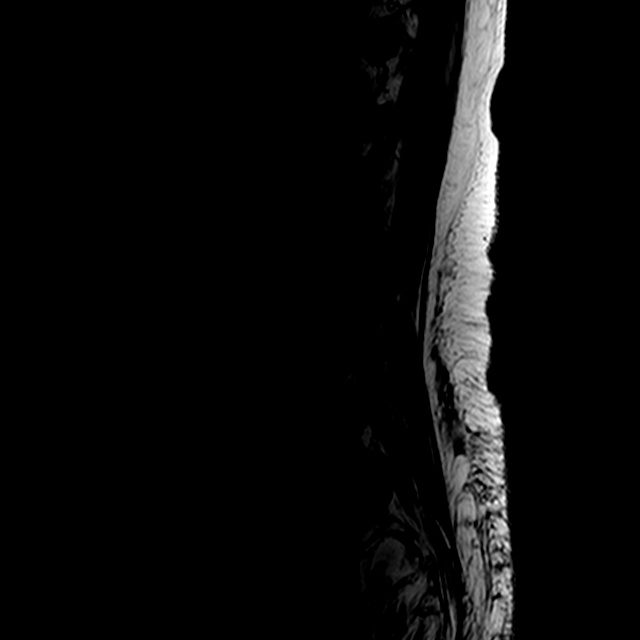
[im 9/15]
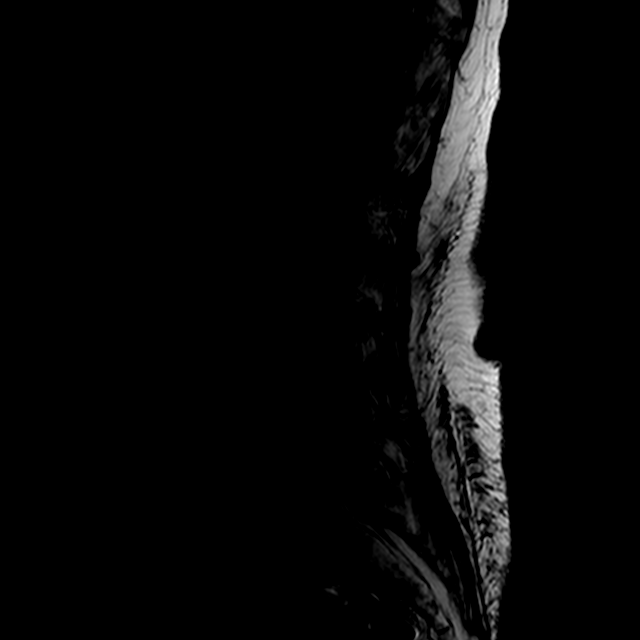
[im 15/15]
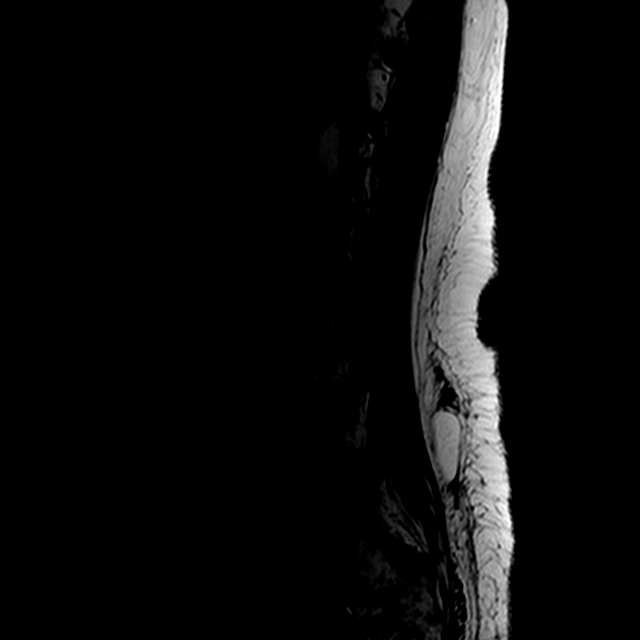

[Series 5: T2 · axial · 4.0mm · 0.39mm/px · z∈[-126,+54]mm · 3 of 35 slices shown (2 of 2)]
[im 5/35]
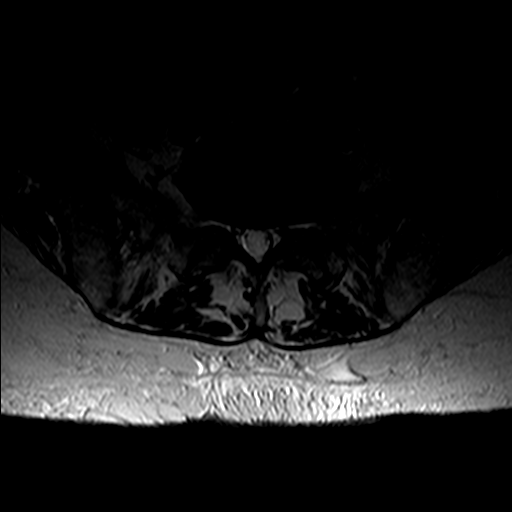
[im 18/35]
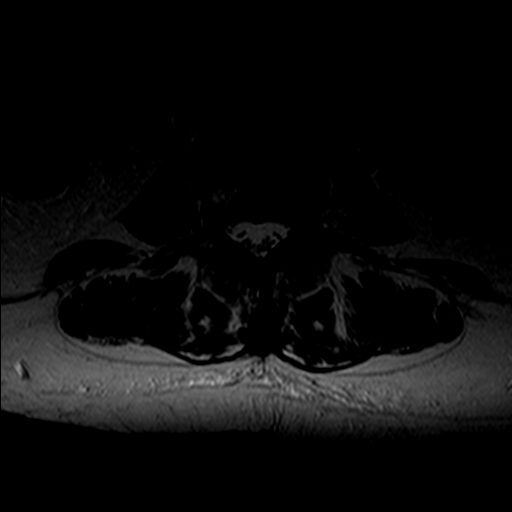
[im 30/35]
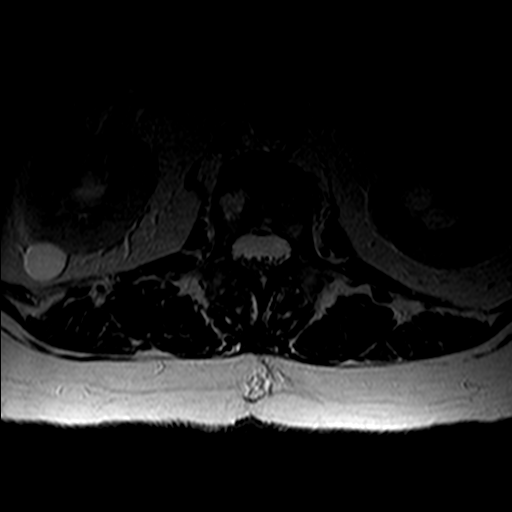

[Series 6: T1 · axial · 4.0mm · 0.39mm/px · z∈[-126,+54]mm · 3 of 35 slices shown (2 of 2)]
[im 5/35]
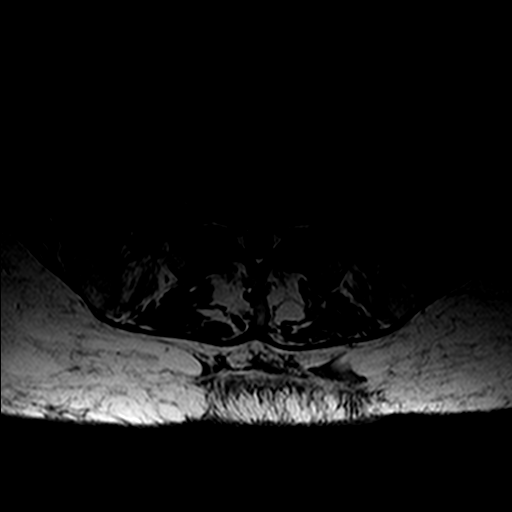
[im 18/35]
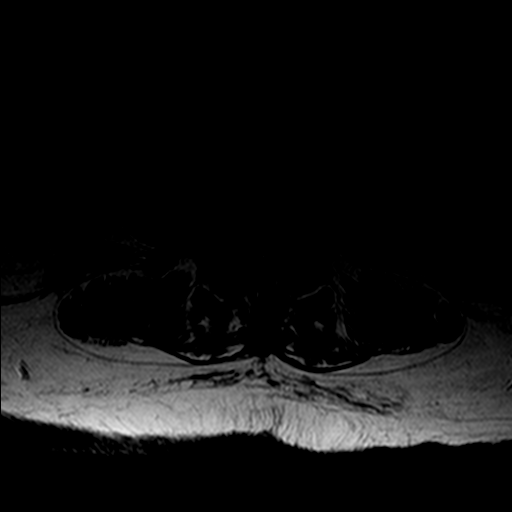
[im 30/35]
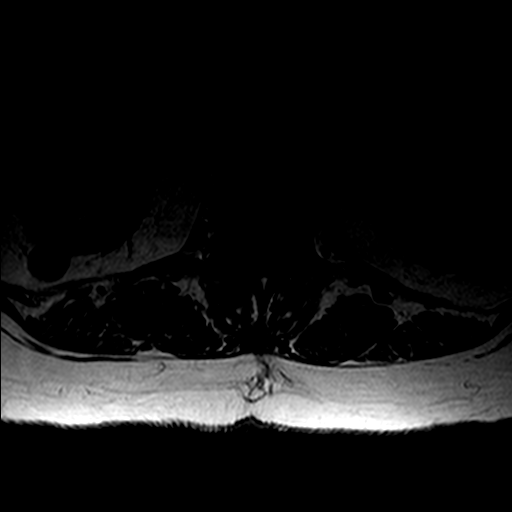

[14 of 48 positions shown; findings below may reference images not displayed]

FINDINGS: For the purposes of this dictation, the lowest well-formed
intervertebral disc space is presumed to be L5-S1.

Grade 1 anterolisthesis of L4 on L5 appears facet mediated and
measures 5 mm. There is also trace retrolisthesis of T12 on L1.
Lumbar vertebral body heights are preserved. There is a mild T11
superior endplate compression fracture which appears chronic.

There is diffuse lumbar disc desiccation. Mild disc space narrowing
is present at L4-5 with minimal narrowing at L2-3. Hemangiomas are
noted in the L1-L3 vertebral bodies. No significant vertebral marrow
edema is seen. Conus medullaris is normal in signal and terminates
at L1. Right and left upper pole T2 hyperintense renal lesions
measure 1.6 cm and 1.3 cm, respectively, most likely cysts.
Bilateral extrarenal pelves are also noted.

T12-L1: Mild disc bulging without stenosis.

L1-2: Mild disc bulging and mild facet hypertrophy without stenosis.

L2-3: Mild disc bulging and moderate facet and ligamentum flavum
hypertrophy result in mild bilateral lateral recess stenosis, mild
spinal stenosis, and minimal right neural foraminal stenosis.

L3-4: Mild disc bulging, ligamentum flavum hypertrophy, and moderate
facet hypertrophy result in mild left greater than right lateral
recess stenosis, mild to moderate spinal stenosis, and no
significant neural foraminal stenosis.

L4-5: Listhesis with disc uncovering, ligamentum flavum hypertrophy,
and severe facet arthrosis result in severe spinal stenosis and
minimal left neural foraminal narrowing.

L5-S1: Mild disc bulging asymmetric to the left and mild facet
hypertrophy result in mild left neural foraminal stenosis and
minimal left lateral recess narrowing without spinal stenosis.
IMPRESSION: Moderate multilevel lumbar disc and facet degeneration, worst at
L4-5 where there is grade 1 anterolisthesis and severe spinal
stenosis.

## 2017-10-09 ENCOUNTER — Encounter: Payer: Medicare Other | Attending: Surgery | Admitting: Surgery

## 2017-10-09 DIAGNOSIS — Z933 Colostomy status: Secondary | ICD-10-CM | POA: Insufficient documentation

## 2017-10-09 DIAGNOSIS — E440 Moderate protein-calorie malnutrition: Secondary | ICD-10-CM | POA: Insufficient documentation

## 2017-10-09 DIAGNOSIS — I509 Heart failure, unspecified: Secondary | ICD-10-CM | POA: Diagnosis not present

## 2017-10-09 DIAGNOSIS — Z7982 Long term (current) use of aspirin: Secondary | ICD-10-CM | POA: Diagnosis not present

## 2017-10-09 DIAGNOSIS — I110 Hypertensive heart disease with heart failure: Secondary | ICD-10-CM | POA: Diagnosis not present

## 2017-10-09 DIAGNOSIS — M1990 Unspecified osteoarthritis, unspecified site: Secondary | ICD-10-CM | POA: Insufficient documentation

## 2017-10-09 DIAGNOSIS — L89154 Pressure ulcer of sacral region, stage 4: Secondary | ICD-10-CM | POA: Insufficient documentation

## 2017-10-09 DIAGNOSIS — Z993 Dependence on wheelchair: Secondary | ICD-10-CM | POA: Insufficient documentation

## 2017-10-09 DIAGNOSIS — Z79899 Other long term (current) drug therapy: Secondary | ICD-10-CM | POA: Insufficient documentation

## 2017-10-11 NOTE — Progress Notes (Signed)
[name], [name] ([id number]) Visit Report for 10/09/2017 Chief Complaint Document Details Patient Name: [name], [name] Date of Service: 10/09/2017 8:00 AM Medical Record Number: [id number] Patient Account Number: [id number] Date of Birth/Sex: 14-Dec-1929 (83 y.o. Female) Treating RN: Montey Hora Primary Care Provider: Priscille Kluver Other Clinician: Referring Provider: Priscille Kluver Treating Provider/Extender: Frann Rider in Treatment: 0 Information Obtained from: Patient Chief Complaint Patient is at the clinic for treatment of an open pressure ulcer to the sacral region which she's had since over 3 months Electronic Signature(s) Signed: 10/09/2017 8:55:19 AM By: Christin Fudge MD, FACS Entered By: Christin Fudge on 10/09/2017 08:55:19 [name], [name] ([id number]) -------------------------------------------------------------------------------- HPI Details Patient Name: [name], [name] Date of Service: 10/09/2017 8:00 AM Medical Record Number: [id number] Patient Account Number: [id number] Date of Birth/Sex: 02-22-1930 (83 y.o. Female) Treating RN: Montey Hora Primary Care Provider: Priscille Kluver Other Clinician: Referring Provider: Priscille Kluver Treating Provider/Extender: Frann Rider in Treatment: 0 History of Present Illness Location: sacral pressure ulcer Quality: Patient reports No Pain. Severity: Patient states wound (s) are getting better. Duration: Patient has had the wound for > 3 months prior to seeking treatment at the wound center Context: The wound would happen gradually Modifying Factors: Other treatment(s) tried include:surgical debridement, IV antibiotics and occupational therapy and physical therapy Associated Signs and Symptoms: Patient reports having:appropriate treatments and home health HPI Description: 83 year old patient who has been referred to [place] for the sacral decubitus ulcer was admitted to the [facility] on 07/18/2017 for an  acute sacral decubitus with osteomyelitis. She was treated there with IV antibiotics and surgical debridement was done on 08/06/2017 and transferred back to a swing bed 08/12/2017. She has completed antibiotics on 09/18/2017 and has been now living at home. Past medical history of GERD, fracture of right humerus, hypertension, rectal cancer, status post colostomy, laparoscopic cholecystectomy, debridement of sacral decubitus ulcer including subcutaneous tissue muscle and bone done on 08/06/2017, previous rectal cancer surgery in April 2017. Reviewing notes from the [facility] it was noted that she was treated for a MRSA sacral osteomyelitis and Proteus bacteremia requiring IV antibiotics which included clindamycin and vancomycin. Vancomycin had to be stopped on September 25 due to neutropenia and thrombocytopenia and changed to daptomycin until 09/20/2017. Infectious disease then advised to switch to clindamycin and the PICC line was removed on 09/22/2017. review of the electronic medical records noted that cultures of bone and tissue taken from the OR for wound debridement on 95 grew MRSA and she was started on IV antibiotics initially clindamycin and switched to IV vancomycin and the Proteus bacteremia was stained treated with by mouth levofloxacin and Flagyl for 6 weeks to end on 09/03/2017. She was also advised an offloading mattress and would follow-up with supportive care Electronic Signature(s) Signed: 10/09/2017 8:56:26 AM By: Christin Fudge MD, FACS Previous Signature: 10/09/2017 8:28:40 AM Version By: Christin Fudge MD, FACS Entered By: Christin Fudge on 10/09/2017 08:56:26 [name], [name] ([id number]) -------------------------------------------------------------------------------- Physical Exam Details Patient Name: [name], [name] Date of Service: 10/09/2017 8:00 AM Medical Record Number: [id number] Patient Account Number: [id number] Date of Birth/Sex: Dec 17, 1929 (83 y.o.  Female) Treating RN: Montey Hora Primary Care Provider: Priscille Kluver Other Clinician: Referring Provider: Priscille Kluver Treating Provider/Extender: Frann Rider in Treatment: 0 Constitutional . Pulse regular. Respirations normal and unlabored. Afebrile. . Eyes Nonicteric. Reactive to light. Ears, Nose, Mouth, and Throat Lips, teeth, and gums WNL.Marland [name] Moist mucosa without lesions. Neck supple and nontender. No palpable supraclavicular or cervical adenopathy.  Normal sized without goiter. Respiratory WNL. No retractions.. Cardiovascular Pedal Pulses WNL. No clubbing, cyanosis or edema. Gastrointestinal (GI) Abdomen without masses or tenderness.. No liver or spleen enlargement or tenderness.. Lymphatic No adneopathy. No adenopathy. No adenopathy. Musculoskeletal Adexa without tenderness or enlargement.. Digits and nails w/o clubbing, cyanosis, infection, petechiae, ischemia, or inflammatory conditions.. Integumentary (Hair, Skin) No suspicious lesions. No crepitus or fluctuance. No peri-wound warmth or erythema. No masses.Marland [name] Psychiatric Judgement and insight Intact.. No evidence of depression, anxiety, or agitation.. Notes patient has a stage IV sacral decubitus ulcer with some slough at the base which will need central ointment and no sharp debridement was required today. The wound does not probe down to bone. It is significant undermining and there is muscle exposed with some healthy granulation tissue. Electronic Signature(s) Signed: 10/09/2017 8:57:08 AM By: Christin Fudge MD, FACS Entered By: Christin Fudge on 10/09/2017 08:57:07 [name] ([id number]) -------------------------------------------------------------------------------- Physician Orders Details Patient Name: [name], [name] Date of Service: 10/09/2017 8:00 AM Medical Record Number: [id number] Patient Account Number: [id number] Date of Birth/Sex: [birth year] (83 y.o. Female) Treating RN: Montey Hora Primary Care Provider: Priscille Kluver Other Clinician: Referring Provider: Priscille Kluver Treating Provider/Extender: Frann Rider in Treatment: 0 Verbal / Phone Orders: No Diagnosis Coding Wound Cleansing Wound #1 Sacrum o Clean wound with Normal Saline. o Cleanse wound with mild soap and water o May Shower, gently pat wound dry prior to applying new dressing. Anesthetic Wound #1 Sacrum o Topical Lidocaine 4% cream applied to wound bed prior to debridement Primary Wound Dressing Wound #1 Sacrum o Santyl Ointment Secondary Dressing Wound #1 Sacrum o Dry Gauze o Boardered Foam Dressing Dressing Change Frequency Wound #1 Sacrum o Change dressing every day. Follow-up Appointments Wound #1 Sacrum o Return Appointment in 1 week. Off-Loading Wound #1 Sacrum o Mattress - Continue air mattress o Turn and reposition every 2 hours Additional Orders / Instructions Wound #1 Sacrum o Increase protein intake. o Activity as tolerated o Other: - Please add vitamin A, vitamin C, multivitamin and zinc supplements to your diet - available over the [place] [address] [place] Visits EVERLY, [place] ([phone] Nurse may visit PRN to address patientos wound care needs. o FACE TO FACE ENCOUNTER: MEDICARE and MEDICAID PATIENTS: I certify that this patient is under my care and that I had a face-to-face encounter that meets the physician face-to-face encounter requirements with this patient on this date. The encounter with the patient was in whole or in part for the following MEDICAL CONDITION: (primary reason for [place]) MEDICAL NECESSITY: I certify, that based on my findings, NURSING services are a medically necessary home health service. HOME BOUND STATUS: I certify that my clinical findings support that this patient is homebound (i.e., Due to illness or injury, pt requires aid of supportive  devices such as crutches, cane, wheelchairs, walkers, the use of special transportation or the assistance of another person to leave their place of residence. There is a normal inability to leave the home and doing so requires considerable and taxing effort. Other absences are for medical reasons / religious services and are infrequent or of short duration when for other reasons). o If current dressing causes regression in wound condition, may D/C ordered dressing product/s and apply Normal Saline Moist Dressing daily until next [place] / Other MD appointment. [place] of regression in wound condition at [phone]. o Please direct any NON-WOUND related issues/requests  for orders to patient's Primary Care Physician Medications-please add to medication list. Wound #1 Sacrum o Santyl Enzymatic Ointment Electronic Signature(s) Signed: 10/09/2017 12:03:11 PM By: Christin Fudge MD, FACS Signed: 10/09/2017 5:11:26 PM By: Montey Hora Entered By: Montey Hora on 10/09/2017 08:45:39 Milkovich, [name] ([id number]) -------------------------------------------------------------------------------- Problem List Details Patient Name: [name], [name] Date of Service: 10/09/2017 8:00 AM Medical Record Number: [id number] Patient Account Number: [id number] Date of Birth/Sex: [birth year] (83 y.o. Female) Treating RN: Montey Hora Primary Care Provider: Priscille Kluver Other Clinician: Referring Provider: Priscille Kluver Treating Provider/Extender: Frann Rider in Treatment: 0 Active Problems ICD-10 Encounter Code Description Active Date Diagnosis L89.154 Pressure ulcer of sacral region, stage 4 10/09/2017 Yes E44.0 Moderate protein-calorie malnutrition 10/09/2017 Yes Z99.3 Dependence on wheelchair 10/09/2017 Yes Inactive Problems Resolved Problems Electronic Signature(s) Signed: 10/09/2017 8:54:46 AM By: Christin Fudge MD, FACS Entered By: Christin Fudge on  10/09/2017 08:54:46 [name], [name] ([id number]) -------------------------------------------------------------------------------- Progress Note Details Patient Name: [name], [name] Date of Service: 10/09/2017 8:00 AM Medical Record Number: [id number] Patient Account Number: [id number] Date of Birth/Sex: 11-Apr-1930 (83 y.o. Female) Treating RN: Montey Hora Primary Care Provider: Priscille Kluver Other Clinician: Referring Provider: Priscille Kluver Treating Provider/Extender: Frann Rider in Treatment: 0 Subjective Chief Complaint Information obtained from Patient Patient is at the clinic for treatment of an open pressure ulcer to the sacral region which she's had since over 3 months History of Present Illness (HPI) The following HPI elements were documented for the patient's wound: Location: sacral pressure ulcer Quality: Patient reports No Pain. Severity: Patient states wound (s) are getting better. Duration: Patient has had the wound for > 3 months prior to seeking treatment at the wound center Context: The wound would happen gradually Modifying Factors: Other treatment(s) tried include:surgical debridement, IV antibiotics and occupational therapy and physical therapy Associated Signs and Symptoms: Patient reports having:appropriate treatments and home health 83 year old patient who has been referred to [place] for the sacral decubitus ulcer was admitted to the [facility] on 07/18/2017 for an acute sacral decubitus with osteomyelitis. She was treated there with IV antibiotics and surgical debridement was done on 08/06/2017 and transferred back to a swing bed 08/12/2017. She has completed antibiotics on 09/18/2017 and has been now living at home. Past medical history of GERD, fracture of right humerus, hypertension, rectal cancer, status post colostomy, laparoscopic cholecystectomy, debridement of sacral decubitus ulcer including subcutaneous tissue muscle and bone done on  08/06/2017, previous rectal cancer surgery in April 2017. Reviewing notes from the [facility] it was noted that she was treated for a MRSA sacral osteomyelitis and Proteus bacteremia requiring IV antibiotics which included clindamycin and vancomycin. Vancomycin had to be stopped on September 25 due to neutropenia and thrombocytopenia and changed to daptomycin until 09/20/2017. Infectious disease then advised to switch to clindamycin and the PICC line was removed on 09/22/2017. review of the electronic medical records noted that cultures of bone and tissue taken from the OR for wound debridement on 95 grew MRSA and she was started on IV antibiotics initially clindamycin and switched to IV vancomycin and the Proteus bacteremia was stained treated with by mouth levofloxacin and Flagyl for 6 weeks to end on 09/03/2017. She was also advised an offloading mattress and would follow-up with supportive care Wound History Patient presents with 1 open wound that has been present for approximately 3 months. Patient has been treating wound in the following manner: santyl. Laboratory tests have been performed in the last month. Patient reportedly has not  tested positive for an antibiotic resistant organism. Patient reportedly has tested positive for osteomyelitis. Patient reportedly has not had testing performed to evaluate circulation in the legs. Patient History Information obtained from Patient. Allergies No Known Allergies Family History [place], [employer] ([id number]) Cancer - Child, No family history of Diabetes, Heart Disease, Hereditary Spherocytosis, Hypertension, Kidney Disease, Lung Disease, Seizures, Stroke, Thyroid Problems, Tuberculosis. Social History Never smoker, Marital Status - Married, Alcohol Use - Never, Drug Use - No History, Caffeine Use - Daily. Medical History Hematologic/Lymphatic Patient has history of Anemia Denies history of Hemophilia, Human Immunodeficiency Virus,  Lymphedema, Sickle Cell Disease Respiratory Denies history of Aspiration, Asthma, Chronic Obstructive Pulmonary Disease (COPD), Pneumothorax, Sleep Apnea, Tuberculosis Cardiovascular Patient has history of Congestive Heart Failure, Hypertension Denies history of Angina, Arrhythmia, Coronary Artery Disease, Deep Vein Thrombosis, Hypotension, Myocardial Infarction, Peripheral Arterial Disease, Peripheral Venous Disease Gastrointestinal Denies history of Cirrhosis , Colitis, Crohn s, Hepatitis A, Hepatitis B Immunological Denies history of Lupus Erythematosus, Raynaud s, Scleroderma Musculoskeletal Patient has history of Osteoarthritis, Osteomyelitis Denies history of Gout, Rheumatoid Arthritis Neurologic Denies history of Dementia, Neuropathy, Quadriplegia Oncologic Patient has history of Received Chemotherapy, Received Radiation Medical And Surgical History Notes Gastrointestinal colostomy since 1990 Oncologic colorectal - 1990 Review of Systems (ROS) Constitutional Symptoms (General Health) The patient has no complaints or symptoms. Eyes The patient has no complaints or symptoms. Ear/Nose/Mouth/Throat The patient has no complaints or symptoms. Hematologic/Lymphatic The patient has no complaints or symptoms. Respiratory The patient has no complaints or symptoms. Cardiovascular The patient has no complaints or symptoms. Gastrointestinal The patient has no complaints or symptoms. Endocrine The patient has no complaints or symptoms. Genitourinary The patient has no complaints or symptoms. Immunological The patient has no complaints or symptoms. Integumentary (Skin) The patient has no complaints or symptoms. Musculoskeletal [name], [name] ([id number]) The patient has no complaints or symptoms. Psychiatric The patient has no complaints or symptoms. Medications carvedilol 25 mg tablet oral one and one half tablets oral (37.5 mg.) two times daily acetaminophen 500 mg  tablet oral 2 2 tablet oral every eight hours tramadol 50 mg tablet oral 1 1 tablet oral every six hours as needed lisinopril 20 mg tablet oral 2 2 tablets oral daily clonidine 0.3 mg/24 hr weekly transdermal patch transdermal 1 1 patch weekly transdermal weekly amlodipine 10 mg tablet oral 1 1 tablet oral daily calcium carbonate 600 mg calcium (1,500 mg) tablet oral 1 1 tablet oral three times weekly furosemide 40 mg tablet oral 2 2 tablets oral (80 mg.) two times daily magnesium oxide 400 mg tablet oral 2 2 tablets oral (800 mg.) two times daily aspirin 81 mg tablet,delayed release oral 1 1 tablet,delayed release (DR/EC) oral daily omeprazole 20 mg capsule,delayed release oral 1 1 capsule,delayed release(DR/EC) oral daily fluoxetine 10 mg tablet oral 1 1 tablet oral daily levothyroxine 25 mcg tablet oral 1 1 tablet oral Santyl 250 unit/gram topical ointment topical ointment topical apply daily cyanocobalamin (vit B-12) 1,000 mcg tablet oral 1 1 tablet oral daily Objective Constitutional Pulse regular. Respirations normal and unlabored. Afebrile. Vitals Time Taken: 8:09 AM, Height: 64 in, Source: Measured, Weight: 200 lbs, Source: Measured, BMI: 34.3, Temperature: 97.5 F, Pulse: 55 bpm, Respiratory Rate: 18 breaths/min, Blood Pressure: 173/51 mmHg. Eyes Nonicteric. Reactive to light. Ears, Nose, Mouth, and Throat Lips, teeth, and gums WNL.Marland [name] Moist mucosa without lesions. Neck supple and nontender. No palpable supraclavicular or cervical adenopathy. Normal sized without goiter. Respiratory WNL. No retractions.. Cardiovascular Pedal Pulses WNL.  No clubbing, cyanosis or edema. Gastrointestinal (GI) Abdomen without masses or tenderness.. No liver or spleen enlargement or tenderness.. Lymphatic [name], [name] L. ([id number]) No adneopathy. No adenopathy. No adenopathy. Musculoskeletal Adexa without tenderness or enlargement.. Digits and nails w/o clubbing, cyanosis, infection,  petechiae, ischemia, or inflammatory conditions.Marland [name] Psychiatric Judgement and insight Intact.. No evidence of depression, anxiety, or agitation.. General Notes: patient has a stage IV sacral decubitus ulcer with some slough at the base which will need central ointment and no sharp debridement was required today. The wound does not probe down to bone. It is significant undermining and there is muscle exposed with some healthy granulation tissue. Integumentary (Hair, Skin) No suspicious lesions. No crepitus or fluctuance. No peri-wound warmth or erythema. No masses.. Wound #1 status is Open. Original cause of wound was Pressure Injury. The wound is located on the Sacrum. The wound measures 1.2cm length x 1.2cm width x 1.3cm depth; 1.131cm^2 area and 1.47cm^3 volume. There is tendon, Fat Layer (Subcutaneous Tissue) Exposed, and fascia exposed. There is no tunneling noted, however, there is undermining starting at 1:00 and ending at 1:00 with a maximum distance of 1.8cm. There is a large amount of serous drainage noted. The wound margin is flat and intact. There is medium (34-66%) red granulation within the wound bed. There is a medium (34-66%) amount of necrotic tissue within the wound bed including Eschar and Adherent Slough. The periwound skin appearance exhibited: Scarring. The periwound skin appearance did not exhibit: Callus, Crepitus, Excoriation, Induration, Rash, Dry/Scaly, Maceration, Atrophie Blanche, Cyanosis, Ecchymosis, Hemosiderin Staining, Mottled, Pallor, Rubor, Erythema. Periwound temperature was noted as No Abnormality. Assessment Active Problems ICD-10 L89.154 - Pressure ulcer of sacral region, stage 4 E44.0 - Moderate protein-calorie malnutrition Z99.3 - Dependence on wheelchair 83 year old patient who has been wheelchair-bound and fairly deconditioned and is now at home after a protracted treatment in hospital and in a rehabilitation facility over the last 3 months. She has  had surgical debridement of the sacral bone which grew MRSA and Proteus and has been treated with infectious disease appropriately up to the middle of October and her PICC line and oral antibiotics have now been stopped. After review today I have recommended: 1. Santyl ointment with local packing to be changed daily over the next week and covered with a bordered foam. 2. offloading has been discussed in great detail and we have discussed frequent mobilization and working with OT and PT 3. Appropriate bed surface, mattress and Roho cushion already in place 4. Adequate protein, vitamin A, vitamin C and zinc 5. Regular visits the wound center She and her caregiver have had all questions answered and we have discussed the possibility of switching over to a wound VAC system once the wound is a little cleaner. [place], [place] ([id number]) Plan Wound Cleansing: Wound #1 Sacrum: Clean wound with Normal Saline. Cleanse wound with mild soap and water May Shower, gently pat wound dry prior to applying new dressing. Anesthetic: Wound #1 Sacrum: Topical Lidocaine 4% cream applied to wound bed prior to debridement Primary Wound Dressing: Wound #1 Sacrum: Santyl Ointment Secondary Dressing: Wound #1 Sacrum: Dry Gauze Boardered Foam Dressing Dressing Change Frequency: Wound #1 Sacrum: Change dressing every day. Follow-up Appointments: Wound #1 Sacrum: Return Appointment in 1 week. Off-Loading: Wound #1 Sacrum: Mattress - Continue air mattress Turn and reposition every 2 hours Additional Orders / Instructions: Wound #1 Sacrum: Increase protein intake. Activity as tolerated Other: - Please add vitamin A, vitamin C, multivitamin and zinc supplements to your  diet - available over the counter Home Health: Wound #1 Sacrum: [place] Nurse may visit PRN to address patient s wound care needs. FACE TO FACE ENCOUNTER: MEDICARE and MEDICAID PATIENTS: I certify that  this patient is under my care and that I had a face-to-face encounter that meets the physician face-to-face encounter requirements with this patient on this date. The encounter with the patient was in whole or in part for the following MEDICAL CONDITION: (primary reason for [place]) MEDICAL NECESSITY: I certify, that based on my findings, NURSING services are a medically necessary home health service. HOME BOUND STATUS: I certify that my clinical findings support that this patient is homebound (i.e., Due to illness or injury, pt requires aid of supportive devices such as crutches, cane, wheelchairs, walkers, the use of special transportation or the assistance of another person to leave their place of residence. There is a normal inability to leave the home and doing so requires considerable and taxing effort. Other absences are for medical reasons / religious services and are infrequent or of short duration when for other reasons). If current dressing causes regression in wound condition, may D/C ordered dressing product/s and apply Normal Saline Moist Dressing daily until next [place] / Other MD appointment. [place] of regression in wound condition at [phone]. Please direct any NON-WOUND related issues/requests for orders to patient's Primary Care Physician Medications-please add to medication list.: Wound #1 Sacrum: Santyl Enzymatic Ointment CALLA, WEDEKIND ([id number]) 83 year old patient who has been wheelchair-bound and fairly deconditioned and is now at home after a protracted treatment in hospital and in a rehabilitation facility over the last 3 months. She has had surgical debridement of the sacral bone which grew MRSA and Proteus and has been treated with infectious disease appropriately up to the middle of October and her PICC line and oral antibiotics have now been stopped. After review today I have recommended: 1. Santyl ointment with  local packing to be changed daily over the next week and covered with a bordered foam. 2. offloading has been discussed in great detail and we have discussed frequent mobilization and working with OT and PT 3. Appropriate bed surface, mattress and Roho cushion already in place 4. Adequate protein, vitamin A, vitamin C and zinc 5. Regular visits the wound center She and her caregiver have had all questions answered and we have discussed the possibility of switching over to a wound VAC system once the wound is a little cleaner. Electronic Signature(s) Signed: 10/09/2017 8:59:11 AM By: Christin Fudge MD, FACS Entered By: Christin Fudge on 10/09/2017 08:59:11 [place], [place]. ([id number]) -------------------------------------------------------------------------------- ROS/PFSH Details Patient Name: [name], Aylana L. Date of Service: 10/09/2017 8:00 AM Medical Record Number: [id number] Patient Account Number: [id number] Date of Birth/Sex: [birth year] (83 y.o. Female) Treating RN: Montey Hora Primary Care Provider: Priscille Kluver Other Clinician: Referring Provider: Priscille Kluver Treating Provider/Extender: Frann Rider in Treatment: 0 Information Obtained From Patient Wound History Do you currently have one or more open woundso Yes How many open wounds do you currently haveo 1 Approximately how long have you had your woundso 3 months How have you been treating your wound(s) until nowo santyl Has your wound(s) ever healed and then re-openedo No Have you had any lab work done in the past montho Yes Who ordered the lab work [facility] Have you tested positive for an antibiotic resistant organism (MRSA, VRE)o No Have you tested positive for osteomyelitis (bone infection)o  Yes Have you had any tests for circulation on your legso No Constitutional Symptoms (General Health) Complaints and Symptoms: No Complaints or Symptoms Eyes Complaints and Symptoms: No Complaints or  Symptoms Ear/Nose/Mouth/Throat Complaints and Symptoms: No Complaints or Symptoms Hematologic/Lymphatic Complaints and Symptoms: No Complaints or Symptoms Medical History: Positive for: Anemia Negative for: Hemophilia; Human Immunodeficiency Virus; Lymphedema; Sickle Cell Disease Respiratory Complaints and Symptoms: No Complaints or Symptoms Medical History: Negative for: Aspiration; Asthma; Chronic Obstructive Pulmonary Disease (COPD); Pneumothorax; Sleep Apnea; Tuberculosis Cardiovascular Spegal, Aron L. ([id number]) Complaints and Symptoms: No Complaints or Symptoms Medical History: Positive for: Congestive Heart Failure; Hypertension Negative for: Angina; Arrhythmia; Coronary Artery Disease; Deep Vein Thrombosis; Hypotension; Myocardial Infarction; Peripheral Arterial Disease; Peripheral Venous Disease Gastrointestinal Complaints and Symptoms: No Complaints or Symptoms Medical History: Negative for: Cirrhosis ; Colitis; Crohnos; Hepatitis A; Hepatitis B Past Medical History Notes: colostomy since 1990 Endocrine Complaints and Symptoms: No Complaints or Symptoms Genitourinary Complaints and Symptoms: No Complaints or Symptoms Immunological Complaints and Symptoms: No Complaints or Symptoms Medical History: Negative for: Lupus Erythematosus; Raynaudos; Scleroderma Integumentary (Skin) Complaints and Symptoms: No Complaints or Symptoms Musculoskeletal Complaints and Symptoms: No Complaints or Symptoms Medical History: Positive for: Osteoarthritis; Osteomyelitis Negative for: Gout; Rheumatoid Arthritis Neurologic Medical History: Negative for: Dementia; Neuropathy; Quadriplegia Oncologic Medical History: Positive for: Received Chemotherapy; Received Radiation Past Medical History Notes: MRYTLE, BENTO ([id number]) colorectal - 1990 Psychiatric Complaints and Symptoms: No Complaints or Symptoms Immunizations Pneumococcal Vaccine: Received  Pneumococcal Vaccination: Yes Immunization Notes: up to date Implantable Devices Family and Social History Cancer: Yes - Child; Diabetes: No; Heart Disease: No; Hereditary Spherocytosis: No; Hypertension: No; Kidney Disease: No; Lung Disease: No; Seizures: No; Stroke: No; Thyroid Problems: No; Tuberculosis: No; Never smoker; Marital Status - Married; Alcohol Use: Never; Drug Use: No History; Caffeine Use: Daily; Financial Concerns: No; Food, Clothing or Shelter Needs: No; Support System Lacking: No; Transportation Concerns: No; Advanced Directives: Yes (Not Provided); Patient does not want information on Advanced Directives; Living Will: Yes (Not Provided); Medical Power of Attorney: Yes - son - Keller Mikels (Not Provided) Physician Affirmation I have reviewed and agree with the above information. Electronic Signature(s) Signed: 10/09/2017 12:03:11 PM By: Christin Fudge MD, FACS Signed: 10/09/2017 5:11:26 PM By: Montey Hora Entered By: Christin Fudge on 10/09/2017 08:53:39 Casimir, Meta L. ([id number]) -------------------------------------------------------------------------------- SuperBill Details Patient Name: [name], Dyneshia L. Date of Service: 10/09/2017 Medical Record Number: [id number] Patient Account Number: [id number] Date of Birth/Sex: 07/05/1930 (83 y.o. Female) Treating RN: Montey Hora Primary Care Provider: Priscille Kluver Other Clinician: Referring Provider: Priscille Kluver Treating Provider/Extender: Frann Rider in Treatment: 0 Diagnosis Coding ICD-10 Codes Code Description L89.154 Pressure ulcer of sacral region, stage 4 E44.0 Moderate protein-calorie malnutrition Z99.3 Dependence on wheelchair Facility Procedures CPT4 Code: 01027253 Description: [place] VISIT-LEV 4 EST PT Modifier: Quantity: 1 Physician Procedures CPT4 Code: 6644034 Description: 74259 - WC PHYS LEVEL 4 - NEW PT ICD-10 Diagnosis Description L89.154 Pressure ulcer of sacral  region, stage 4 E44.0 Moderate protein-calorie malnutrition Z99.3 Dependence on wheelchair Modifier: Quantity: 1 Electronic Signature(s) Signed: 10/09/2017 9:24:52 AM By: Montey Hora Signed: 10/09/2017 12:03:11 PM By: Christin Fudge MD, FACS Previous Signature: 10/09/2017 8:59:20 AM Version By: Christin Fudge MD, FACS Entered By: Montey Hora on 10/09/2017 09:24:52

## 2017-10-11 NOTE — Progress Notes (Signed)
PAISYN, GUERCIO ([id number]) Visit Report for 10/09/2017 Abuse/Suicide Risk Screen Details Patient Name: HUKILL, Raney L. Date of Service: 10/09/2017 8:00 AM Medical Record Number: [id number] Patient Account Number: [id number] Date of Birth/Sex: [birth year] (83 y.o. Female) Treating RN: Montey Hora Primary Care Tiyon Sanor: Priscille Kluver Other Clinician: Referring Braleigh Massoud: Priscille Kluver Treating Jaylea Plourde/Extender: Frann Rider in Treatment: 0 Abuse/Suicide Risk Screen Items Answer ABUSE/SUICIDE RISK SCREEN: Has anyone close to you tried to hurt or harm you recentlyo No Do you feel uncomfortable with anyone in your familyo No Has anyone forced you do things that you didnot want to doo No Do you have any thoughts of harming yourselfo No Patient displays signs or symptoms of abuse and/or neglect. No Electronic Signature(s) Signed: 10/09/2017 5:11:26 PM By: Montey Hora Entered By: Montey Hora on 10/09/2017 08:17:09 Malinowski, Margarette L. ([id number]) -------------------------------------------------------------------------------- Activities of Daily Living Details Patient Name: Glasser, Narissa L. Date of Service: 10/09/2017 8:00 AM Medical Record Number: [id number] Patient Account Number: [id number] Date of Birth/Sex: May 21, 1930 (83 y.o. Female) Treating RN: Montey Hora Primary Care Kaysan Peixoto: Priscille Kluver Other Clinician: Referring Eirene Rather: Priscille Kluver Treating Sakeena Teall/Extender: Frann Rider in Treatment: 0 Activities of Daily Living Items Answer Activities of Daily Living (Please select one for each item) Drive Automobile Not Able Take Medications Completely Able Use Telephone Completely Able Care for Appearance Need Assistance Use Toilet Need Assistance Bath / Shower Need Assistance Dress Self Need Assistance Feed Self Completely Able Walk Not Able Get In / Out Bed Need Assistance Housework Need [place] for Self Need Assistance Electronic Signature(s) Signed: 10/09/2017 5:11:26 PM By: Montey Hora Entered By: Montey Hora on 10/09/2017 08:17:38 Ricardo, Airen L. ([id number]) -------------------------------------------------------------------------------- Education Assessment Details Patient Name: [name], Mary-Anne L. Date of Service: 10/09/2017 8:00 AM Medical Record Number: [id number] Patient Account Number: [id number] Date of Birth/Sex: 09-Aug-1930 (83 y.o. Female) Treating RN: Montey Hora Primary Care Alaisha Eversley: Priscille Kluver Other Clinician: Referring Zyara Riling: Priscille Kluver Treating Victoria Henshaw/Extender: Frann Rider in Treatment: 0 Primary Learner Assessed: Caregiver HHRN Learning Preferences/Education Level/Primary Language Learning Preference: Explanation, Demonstration Highest Education Level: College or Above Preferred Language: English Cognitive Barrier Assessment/Beliefs Language Barrier: No Translator Needed: No Memory Deficit: No Emotional Barrier: No Cultural/Religious Beliefs Affecting Medical Care: No Physical Barrier Assessment Impaired Vision: No Impaired Hearing: No Decreased Hand dexterity: No Knowledge/Comprehension Assessment Knowledge Level: Medium Comprehension Level: Medium Ability to understand written Medium instructions: Ability to understand verbal Medium instructions: Motivation Assessment Anxiety Level: Calm Cooperation: Cooperative Education Importance: Acknowledges Need Interest in Health Problems: Asks Questions Perception: Coherent Willingness to Engage in Self- Medium Management Activities: Readiness to Engage in Self- Medium Management Activities: Electronic Signature(s) Signed: 10/09/2017 5:11:26 PM By: Montey Hora Entered By: Montey Hora on 10/09/2017 08:18:04 Brandel, Jeanine L. ([id number]) -------------------------------------------------------------------------------- Fall Risk Assessment  Details Patient Name: Grissom, Muskaan L. Date of Service: 10/09/2017 8:00 AM Medical Record Number: [id number] Patient Account Number: [id number] Date of Birth/Sex: 26-Jan-1930 (83 y.o. Female) Treating RN: Montey Hora Primary Care Kesi Perrow: Priscille Kluver Other Clinician: Referring Jeanpaul Biehl: Priscille Kluver Treating Natalie Mceuen/Extender: Frann Rider in Treatment: 0 Fall Risk Assessment Items Have you had 2 or more falls in the last 12 monthso 0 Yes Have you had any fall that resulted in injury in the last 12 monthso 0 No FALL RISK ASSESSMENT: History of falling - immediate or within 3 months 25 Yes Secondary diagnosis 0 No Ambulatory aid None/bed rest/wheelchair/nurse 0 Yes Crutches/cane/walker 15 Yes Furniture 0 No IV  Access/Saline Lock 0 No Gait/Training Normal/bed rest/immobile 0 Yes Weak 10 Yes Impaired 0 No Mental Status Oriented to own ability 0 Yes Electronic Signature(s) Signed: 10/09/2017 5:11:26 PM By: Montey Hora Entered By: Montey Hora on 10/09/2017 08:18:30 Pasley, Gurleen L. ([id number]) -------------------------------------------------------------------------------- Nutrition Risk Assessment Details Patient Name: Markovitz, Chameka L. Date of Service: 10/09/2017 8:00 AM Medical Record Number: [id number] Patient Account Number: [id number] Date of Birth/Sex: 05/21/1930 (83 y.o. Female) Treating RN: Montey Hora Primary Care Kylii Ennis: Priscille Kluver Other Clinician: Referring Lalla Laham: Priscille Kluver Treating Jayshaun Phillips/Extender: Frann Rider in Treatment: 0 Height (in): 64 Weight (lbs): 200 Body Mass Index (BMI): 34.3 Nutrition Risk Assessment Items NUTRITION RISK SCREEN: I have an illness or condition that made me change the kind and/or amount of 0 No food I eat I eat fewer than two meals per day 0 No I eat few fruits and vegetables, or milk products 0 No I have three or more drinks of beer, liquor or wine almost every day 0 No I have tooth or mouth  problems that make it hard for me to eat 0 No I don't always have enough money to buy the food I need 0 No I eat alone most of the time 0 No I take three or more different prescribed or over-the-counter drugs a day 1 Yes Without wanting to, I have lost or gained 10 pounds in the last six months 0 No I am not always physically able to shop, cook and/or feed myself 0 No Nutrition Protocols Good Risk Protocol 0 No interventions needed Moderate Risk Protocol Electronic Signature(s) Signed: 10/09/2017 5:11:26 PM By: Montey Hora Entered By: Montey Hora on 10/09/2017 08:18:43

## 2017-10-13 NOTE — Progress Notes (Signed)
TOMMYE, LEHENBAUER ([id number]) Visit Report for 10/09/2017 Allergy List Details Patient Name: VESSEY, Unice L. Date of Service: 10/09/2017 8:00 AM Medical Record Number: [id number] Patient Account Number: [id number] Date of Birth/Sex: [birth year] (83 y.o. Female) Treating RN: Montey Hora Primary Care Verlene Glantz: Priscille Kluver Other Clinician: Referring Samie Barclift: Priscille Kluver Treating Jersie Beel/Extender: Frann Rider in Treatment: 0 Allergies Active Allergies No Known Allergies Allergy Notes Electronic Signature(s) Signed: 10/09/2017 5:11:26 PM By: Montey Hora Entered By: Montey Hora on 10/09/2017 08:10:33 Pierron, Akiak ([id number]) -------------------------------------------------------------------------------- Arrival Information Details Patient Name: [name], Adrienne L. Date of Service: 10/09/2017 8:00 AM Medical Record Number: [id number] Patient Account Number: [id number] Date of Birth/Sex: [birth year] (83 y.o. Female) Treating RN: Montey Hora Primary Care Odessa Nishi: Priscille Kluver Other Clinician: Referring Justene Jensen: Priscille Kluver Treating Lakena Sparlin/Extender: Frann Rider in Treatment: 0 Visit Information Patient Arrived: Wheel Chair Arrival Time: 08:06 Accompanied By: caregiver Transfer Assistance: Manual Patient Identification Verified: Yes Secondary Verification Process Completed: Yes Electronic Signature(s) Signed: 10/09/2017 5:11:26 PM By: Montey Hora Entered By: Montey Hora on 10/09/2017 08:09:30 Cannaday, Crytal L. ([id number]) -------------------------------------------------------------------------------- Clinic Level of Care Assessment Details Patient Name: Ran, Ova L. Date of Service: 10/09/2017 8:00 AM Medical Record Number: [id number] Patient Account Number: [id number] Date of Birth/Sex: 1930/04/22 (83 y.o. Female) Treating RN: Montey Hora Primary Care Shameika Speelman: Priscille Kluver Other Clinician: Referring Isolde Skaff: Priscille Kluver Treating Gio Janoski/Extender: Frann Rider in Treatment: 0 Clinic Level of Care Assessment Items TOOL 2 Quantity Score []  - Use when only an EandM is performed on the INITIAL visit 0 ASSESSMENTS - Nursing Assessment / Reassessment X - General Physical Exam (combine w/ comprehensive assessment (listed just below) when 1 20 performed on new pt. evals) X- 1 25 Comprehensive Assessment (HX, ROS, Risk Assessments, Wounds Hx, etc.) ASSESSMENTS - Wound and Skin Assessment / Reassessment X - Simple Wound Assessment / Reassessment - one wound 1 5 []  - 0 Complex Wound Assessment / Reassessment - multiple wounds []  - 0 Dermatologic / Skin Assessment (not related to wound area) ASSESSMENTS - Ostomy and/or Continence Assessment and Care []  - Incontinence Assessment and Management 0 []  - 0 Ostomy Care Assessment and Management (repouching, etc.) PROCESS - Coordination of Care X - Simple Patient / Family Education for ongoing care 1 15 []  - 0 Complex (extensive) Patient / Family Education for ongoing care X- 1 10 Staff obtains Programmer, systems, Records, Test Results / Process Orders []  - 0 Staff telephones HHA, Nursing Homes / Clarify orders / etc []  - 0 Routine Transfer to another Facility (non-emergent condition) []  - 0 Routine Hospital Admission (non-emergent condition) X- 1 15 New Admissions / Biomedical engineer / Ordering NPWT, Apligraf, etc. []  - 0 Emergency Hospital Admission (emergent condition) X- 1 10 Simple Discharge Coordination []  - 0 Complex (extensive) Discharge Coordination PROCESS - Special Needs []  - Pediatric / Minor Patient Management 0 []  - 0 Isolation Patient Management Tang, Mio L. ([id number]) []  - 0 Hearing / Language / Visual special needs []  - 0 Assessment of Community assistance (transportation, D/C planning, etc.) []  - 0 Additional assistance / Altered mentation X- 1 15 Support Surface(s) Assessment (bed, cushion, seat,  etc.) INTERVENTIONS - Wound Cleansing / Measurement X - Wound Imaging (photographs - any number of wounds) 1 5 []  - 0 Wound Tracing (instead of photographs) X- 1 5 Simple Wound Measurement - one wound []  - 0 Complex Wound Measurement - multiple wounds X- 1 5 Simple Wound Cleansing - one wound []  - 0 Complex Wound Cleansing -  multiple wounds INTERVENTIONS - Wound Dressings X - Small Wound Dressing one or multiple wounds 1 10 []  - 0 Medium Wound Dressing one or multiple wounds []  - 0 Large Wound Dressing one or multiple wounds []  - 0 Application of Medications - injection INTERVENTIONS - Miscellaneous []  - External ear exam 0 []  - 0 Specimen Collection (cultures, biopsies, blood, body fluids, etc.) []  - 0 Specimen(s) / Culture(s) sent or taken to Lab for analysis []  - 0 Patient Transfer (multiple staff / Harrel Lemon Lift / Similar devices) []  - 0 Simple Staple / Suture removal (25 or less) []  - 0 Complex Staple / Suture removal (26 or more) []  - 0 Hypo / Hyperglycemic Management (close monitor of Blood Glucose) []  - 0 Ankle / Brachial Index (ABI) - do not check if billed separately Has the patient been seen at the hospital within the last three years: Yes Total Score: 140 Level Of Care: New/Established - Level 4 Electronic Signature(s) Signed: 10/09/2017 5:11:26 PM By: Montey Hora Entered By: Montey Hora on 10/09/2017 09:24:44 Zuccaro, Jenayah L. ([id number]) -------------------------------------------------------------------------------- Encounter Discharge Information Details Patient Name: [name], Fredrika L. Date of Service: 10/09/2017 8:00 AM Medical Record Number: [id number] Patient Account Number: [id number] Date of Birth/Sex: 08/10/1930 (83 y.o. Female) Treating RN: Montey Hora Primary Care Jasmon Graffam: Priscille Kluver Other Clinician: Referring Tessah Patchen: Priscille Kluver Treating Asti Mackley/Extender: Frann Rider in Treatment: 0 Encounter Discharge Information  Items Discharge Pain Level: 0 Discharge Condition: Stable Ambulatory Status: Wheelchair Discharge Destination: Home Transportation: Private Auto Accompanied By: caregiver Schedule Follow-up Appointment: Yes Medication Reconciliation completed and No provided to Patient/Care Clayborn Milnes: Provided on Clinical Summary of Care: 10/09/2017 Form Type Recipient Paper Patient RA Electronic Signature(s) Signed: 10/13/2017 4:27:54 PM By: Ruthine Dose Entered By: Ruthine Dose on 10/09/2017 08:59:46 Bourquin, Serrena L. ([id number]) -------------------------------------------------------------------------------- Multi Wound Chart Details Patient Name: [name], Luba L. Date of Service: 10/09/2017 8:00 AM Medical Record Number: [id number] Patient Account Number: [id number] Date of Birth/Sex: [birth year] (83 y.o. Female) Treating RN: Montey Hora Primary Care Donnie Gedeon: Priscille Kluver Other Clinician: Referring Hailei Besser: Priscille Kluver Treating Chanel Mcadams/Extender: Frann Rider in Treatment: 0 Vital Signs Height(in): 64 Pulse(bpm): 74 Weight(lbs): 200 Blood Pressure(mmHg): 173/51 Body Mass Index(BMI): 34 Temperature(F): 97.5 Respiratory Rate 18 (breaths/min): Photos: [1:No Photos] [N/A:N/A] Wound Location: [1:Sacrum] [N/A:N/A] Wounding Event: [1:Pressure Injury] [N/A:N/A] Primary Etiology: [1:Pressure Ulcer] [N/A:N/A] Comorbid History: [1:Anemia, Congestive Heart Failure, Hypertension, Osteoarthritis, Osteomyelitis, Received Chemotherapy, Received Radiation] [N/A:N/A] Date Acquired: [1:07/14/2017] [N/A:N/A] Weeks of Treatment: [1:0] [N/A:N/A] Wound Status: [1:Open] [N/A:N/A] Measurements L x W x D [1:1.2x1.2x1.3] [N/A:N/A] (cm) Area (cm) : [1:1.131] [N/A:N/A] Volume (cm) : [1:1.47] [N/A:N/A] Starting Position 1 [1:1] (o'clock): Ending Position 1 [1:1] (o'clock): Maximum Distance 1 (cm): [1:1.8] Undermining: [1:Yes] [N/A:N/A] Classification: [1:Category/Stage IV]  [N/A:N/A] Exudate Amount: [1:Large] [N/A:N/A] Exudate Type: [1:Serous] [N/A:N/A] Exudate Color: [1:amber] [N/A:N/A] Wound Margin: [1:Flat and Intact] [N/A:N/A] Granulation Amount: [1:Medium (34-66%)] [N/A:N/A] Granulation Quality: [1:Red] [N/A:N/A] Necrotic Amount: [1:Medium (34-66%)] [N/A:N/A] Necrotic Tissue: [1:Eschar, Adherent Slough] [N/A:N/A] Exposed Structures: [1:Fascia: Yes Fat Layer (Subcutaneous Tissue) Exposed: Yes Tendon: Yes Muscle: No] [N/A:N/A] Joint: No Bone: No Epithelialization: None N/A N/A Periwound Skin Texture: Scarring: Yes N/A N/A Excoriation: No Induration: No Callus: No Crepitus: No Rash: No Periwound Skin Moisture: Maceration: No N/A N/A Dry/Scaly: No Periwound Skin Color: Atrophie Blanche: No N/A N/A Cyanosis: No Ecchymosis: No Erythema: No Hemosiderin Staining: No Mottled: No Pallor: No Rubor: No Temperature: No Abnormality N/A N/A Tenderness on Palpation: No N/A N/A Wound Preparation: Ulcer Cleansing: N/A N/A Rinsed/Irrigated with Saline  Topical Anesthetic Applied: Other: lidocaine 4% Treatment Notes Electronic Signature(s) Signed: 10/09/2017 8:54:50 AM By: Christin Fudge MD, FACS Entered By: Christin Fudge on 10/09/2017 08:54:50 Kivi, Cathren LMarland Kitchen ([id number]) -------------------------------------------------------------------------------- [place] Details Patient Name: [name], Buffy L. Date of Service: 10/09/2017 8:00 AM Medical Record Number: [id number] Patient Account Number: [id number] Date of Birth/Sex: 10-May-1930 (83 y.o. Female) Treating RN: Montey Hora Primary Care Jesicca Dipierro: Priscille Kluver Other Clinician: Referring Malaak Stach: Priscille Kluver Treating Melva Faux/Extender: Frann Rider in Treatment: 0 Active Inactive ` Abuse / Safety / Falls / Self Care Management Nursing Diagnoses: Potential for falls Goals: Patient will not experience any injury related to falls Date Initiated: 10/09/2017 Target  Resolution Date: 01/10/2018 Goal Status: Active Interventions: Assess fall risk on admission and as needed Notes: ` Orientation to the Wound Care Program Nursing Diagnoses: Knowledge deficit related to the wound healing center program Goals: Patient/caregiver will verbalize understanding of the [place] Program Date Initiated: 10/09/2017 Target Resolution Date: 01/10/2018 Goal Status: Active Interventions: Provide education on orientation to the wound center Notes: ` Pressure Nursing Diagnoses: Potential for impaired tissue integrity related to pressure, friction, moisture, and shear Goals: Patient will remain free from development of additional pressure ulcers Date Initiated: 10/09/2017 Target Resolution Date: 01/10/2018 Goal Status: Active Interventions: Cai, Flott Dekota L. ([id number]) Assess offloading mechanisms upon admission and as needed Provide education on pressure ulcers Notes: ` Wound/Skin Impairment Nursing Diagnoses: Impaired tissue integrity Goals: Ulcer/skin breakdown will heal within 14 weeks Date Initiated: 10/09/2017 Target Resolution Date: 01/10/2018 Goal Status: Active Interventions: Assess patient/caregiver ability to obtain necessary supplies Assess patient/caregiver ability to perform ulcer/skin care regimen upon admission and as needed Assess ulceration(s) every visit Notes: Electronic Signature(s) Signed: 10/09/2017 5:11:26 PM By: Montey Hora Entered By: Montey Hora on 10/09/2017 08:34:28 Cockerham, Osie L. ([id number]) -------------------------------------------------------------------------------- Pain Assessment Details Patient Name: [name], Wilmoth L. Date of Service: 10/09/2017 8:00 AM Medical Record Number: [id number] Patient Account Number: [id number] Date of Birth/Sex: December 25, 1929 (83 y.o. Female) Treating RN: Montey Hora Primary Care Moiz Ryant: Priscille Kluver Other Clinician: Referring Rukiya Hodgkins: Priscille Kluver Treating  Rhylen Pulido/Extender: Frann Rider in Treatment: 0 Active Problems Location of Pain Severity and Description of Pain Patient Has Paino No Site Locations Pain Management and Medication Current Pain Management: Notes Topical or injectable lidocaine is offered to patient for acute pain when surgical debridement is performed. If needed, Patient is instructed to use over the counter pain medication for the following 24-48 hours after debridement. Wound care MDs do not prescribed pain medications. Patient has chronic pain or uncontrolled pain. Patient has been instructed to make an appointment with their Primary Care Physician for pain management. Electronic Signature(s) Signed: 10/09/2017 5:11:26 PM By: Montey Hora Entered By: Montey Hora on 10/09/2017 08:09:42 Herzberg, Hollyn Carlean Jews ([id number]) -------------------------------------------------------------------------------- Patient/Caregiver Education Details Patient Name: [name], Clorene L. Date of Service: 10/09/2017 8:00 AM Medical Record Number: [id number] Patient Account Number: [id number] Date of Birth/Gender: Dec 07, 1929 (83 y.o. Female) Treating RN: Montey Hora Primary Care Physician: Priscille Kluver Other Clinician: Referring Physician: Priscille Kluver Treating Physician/Extender: Frann Rider in Treatment: 0 Education Assessment Education Provided To: Patient Education Topics Provided Wound/Skin Impairment: Handouts: Other: wound care as ordered Methods: Demonstration, Explain/Verbal Responses: State content correctly Electronic Signature(s) Signed: 10/09/2017 5:11:26 PM By: Montey Hora Entered By: Montey Hora on 10/09/2017 08:35:34 Friesen, Abrea L. ([id number]) -------------------------------------------------------------------------------- Wound Assessment Details Patient Name: Renier, Laquilla L. Date of Service: 10/09/2017 8:00 AM Medical Record Number: [id number] Patient Account Number:  [id number] Date of Birth/Sex: 06-01-1930 (83 y.o.  Female) Treating RN: Montey Hora Primary Care [name] Kuipers: Priscille Kluver Other Clinician: Referring Ariyon Mittleman: Priscille Kluver Treating Jack Bolio/Extender: Frann Rider in Treatment: 0 Wound Status Wound Number: 1 Primary Pressure Ulcer Etiology: Wound Location: Sacrum Wound Open Wounding Event: Pressure Injury Status: Date Acquired: 07/14/2017 Comorbid Anemia, Congestive Heart Failure, Weeks Of Treatment: 0 History: Hypertension, Osteoarthritis, Osteomyelitis, Clustered Wound: No Received Chemotherapy, Received Radiation Photos Photo Uploaded By: Montey Hora on 10/10/2017 09:14:52 Wound Measurements Length: (cm) 1.2 Width: (cm) 1.2 Depth: (cm) 1.3 Area: (cm) 1.131 Volume: (cm) 1.47 % Reduction in Area: % Reduction in Volume: Epithelialization: None Tunneling: No Undermining: Yes Starting Position (o'clock): 1 Ending Position (o'clock): 1 Maximum Distance: (cm) 1.8 Wound Description Classification: Category/Stage IV Wound Margin: Flat and Intact Exudate Amount: Large Exudate Type: Serous Exudate Color: amber Foul Odor After Cleansing: No Slough/Fibrino Yes Wound Bed Granulation Amount: Medium (34-66%) Exposed Structure Granulation Quality: Red Fascia Exposed: Yes Necrotic Amount: Medium (34-66%) Fat Layer (Subcutaneous Tissue) Exposed: Yes Necrotic Quality: Eschar, Adherent Slough Tendon Exposed: Yes Muscle Exposed: No Luoma, Len L. ([id number]) Joint Exposed: No Bone Exposed: No Periwound Skin Texture Texture Color No Abnormalities Noted: No No Abnormalities Noted: No Callus: No Atrophie Blanche: No Crepitus: No Cyanosis: No Excoriation: No Ecchymosis: No Induration: No Erythema: No Rash: No Hemosiderin Staining: No Scarring: Yes Mottled: No Pallor: No Moisture Rubor: No No Abnormalities Noted: No Dry / Scaly: No Temperature / Pain Maceration: No Temperature: No Abnormality Wound  Preparation Ulcer Cleansing: Rinsed/Irrigated with Saline Topical Anesthetic Applied: Other: lidocaine 4%, Treatment Notes Wound #1 (Sacrum) 1. Cleansed with: Clean wound with Normal Saline 2. Anesthetic Topical Lidocaine 4% cream to wound bed prior to debridement 4. Dressing Applied: Santyl Ointment 5. Secondary Dressing Applied Bordered Foam Dressing Dry Gauze Electronic Signature(s) Signed: 10/09/2017 5:11:26 PM By: Montey Hora Entered By: Montey Hora on 10/09/2017 08:29:09 Acebo, Bilan L. ([id number]) -------------------------------------------------------------------------------- [place] Details Patient Name: [name], Sharyon L. Date of Service: 10/09/2017 8:00 AM Medical Record Number: [id number] Patient Account Number: [id number] Date of Birth/Sex: 07-Aug-1930 (83 y.o. Female) Treating RN: Montey Hora Primary Care Jereme Loren: Priscille Kluver Other Clinician: Referring Soua Caltagirone: Priscille Kluver Treating Duilio Heritage/Extender: Frann Rider in Treatment: 0 Vital Signs Time Taken: 08:09 Temperature (F): 97.5 Height (in): 64 Pulse (bpm): 55 Source: Measured Respiratory Rate (breaths/min): 18 Weight (lbs): 200 Blood Pressure (mmHg): 173/51 Source: Measured Reference Range: 80 - 120 mg / dl Body Mass Index (BMI): 34.3 Electronic Signature(s) Signed: 10/09/2017 5:11:26 PM By: Montey Hora Entered By: Montey Hora on 10/09/2017 08:10:21

## 2017-10-17 ENCOUNTER — Encounter: Payer: Medicare Other | Admitting: Surgery

## 2017-10-17 DIAGNOSIS — L89154 Pressure ulcer of sacral region, stage 4: Secondary | ICD-10-CM | POA: Diagnosis not present

## 2017-10-18 NOTE — Progress Notes (Addendum)
[name], [name] ([id number]) Visit Report for 10/17/2017 Chief Complaint Document Details Patient Name: [name], [name] Date of Service: 10/17/2017 10:15 AM Medical Record Number: [id number] Patient Account Number: [id number] Date of Birth/Sex: 05-Mar-1930 (83 y.o. Female) Treating RN: Montey Hora Primary Care Provider: Priscille Kluver Other Clinician: Referring Provider: Priscille Kluver Treating Provider/Extender: Frann Rider in Treatment: 1 Information Obtained from: Patient Chief Complaint Patient is at the clinic for treatment of an open pressure ulcer to the sacral region which she's had since over 3 months Electronic Signature(s) Signed: 10/17/2017 10:51:03 AM By: Christin Fudge MD, FACS Entered By: Christin Fudge on 10/17/2017 10:51:03 [place], [place]. ([id number]) -------------------------------------------------------------------------------- HPI Details Patient Name: [name], [name] Date of Service: 10/17/2017 10:15 AM Medical Record Number: [id number] Patient Account Number: [id number] Date of Birth/Sex: [birth year] (83 y.o. Female) Treating RN: Montey Hora Primary Care Provider: Priscille Kluver Other Clinician: Referring Provider: Priscille Kluver Treating Provider/Extender: Frann Rider in Treatment: 1 History of Present Illness Location: sacral pressure ulcer Quality: Patient reports No Pain. Severity: Patient states wound (s) are getting better. Duration: Patient has had the wound for > 3 months prior to seeking treatment at the wound center Context: The wound would happen gradually Modifying Factors: Other treatment(s) tried include:surgical debridement, IV antibiotics and occupational therapy and physical therapy Associated Signs and Symptoms: Patient reports having:appropriate treatments and home health HPI Description: 83 year old patient who has been referred to [place] for the sacral decubitus ulcer was admitted to the [facility] on 07/18/2017  for an acute sacral decubitus with osteomyelitis. She was treated there with IV antibiotics and surgical debridement was done on 08/06/2017 and transferred back to a swing bed 08/12/2017. She has completed antibiotics on 09/18/2017 and has been now living at home. Past medical history of GERD, fracture of right humerus, hypertension, rectal cancer, status post colostomy, laparoscopic cholecystectomy, debridement of sacral decubitus ulcer including subcutaneous tissue muscle and bone done on 08/06/2017, previous rectal cancer surgery in April 2017. Reviewing notes from the [facility] it was noted that she was treated for a MRSA sacral osteomyelitis and Proteus bacteremia requiring IV antibiotics which included clindamycin and vancomycin. Vancomycin had to be stopped on September 25 due to neutropenia and thrombocytopenia and changed to daptomycin until 09/20/2017. Infectious disease then advised to switch to clindamycin and the PICC line was removed on 09/22/2017. review of the electronic medical records noted that cultures of bone and tissue taken from the OR for wound debridement on 95 grew MRSA and she was started on IV antibiotics initially clindamycin and switched to IV vancomycin and the Proteus bacteremia was stained treated with by mouth levofloxacin and Flagyl for 6 weeks to end on 09/03/2017. She was also advised an offloading mattress and would follow-up with supportive care 10/17/2017 -- the patient is ambulating a bit with the help of OT and PT and she is also eating much better. She has got inappropriate air mattress in place. Electronic Signature(s) Signed: 10/17/2017 10:51:38 AM By: Christin Fudge MD, FACS Entered By: Christin Fudge on 10/17/2017 10:51:38 [place], Maicee L. ([id number]) -------------------------------------------------------------------------------- Physical Exam Details Patient Name: [name], [name] Date of Service: 10/17/2017 10:15 AM Medical Record  Number: [id number] Patient Account Number: [id number] Date of Birth/Sex: 02/16/1930 (83 y.o. Female) Treating RN: Montey Hora Primary Care Provider: Priscille Kluver Other Clinician: Referring Provider: Priscille Kluver Treating Provider/Extender: Frann Rider in Treatment: 1 Constitutional . Pulse regular. Respirations normal and unlabored. Afebrile. . Eyes Nonicteric. Reactive to light. Ears, Nose, Mouth, and Throat  Lips, teeth, and gums WNL.Marland Kitchen Moist mucosa without lesions. Neck supple and nontender. No palpable supraclavicular or cervical adenopathy. Normal sized without goiter. Respiratory WNL. No retractions.. Cardiovascular Pedal Pulses WNL. No clubbing, cyanosis or edema. Lymphatic No adneopathy. No adenopathy. No adenopathy. Musculoskeletal Adexa without tenderness or enlargement.. Digits and nails w/o clubbing, cyanosis, infection, petechiae, ischemia, or inflammatory conditions.. Integumentary (Hair, Skin) No suspicious lesions. No crepitus or fluctuance. No peri-wound warmth or erythema. No masses.Marland Kitchen Psychiatric Judgement and insight Intact.. No evidence of depression, anxiety, or agitation.. Notes the sacral decubitus ulcer still has significant amount of undermining and there is some fibrotic debris at the edges which will continue to use Santyl ointment locally. I was not able to palpate any bone. Electronic Signature(s) Signed: 10/17/2017 10:52:28 AM By: Christin Fudge MD, FACS Entered By: Christin Fudge on 10/17/2017 10:52:28 [place], Afia Carlean Jews ([id number]) -------------------------------------------------------------------------------- Physician Orders Details Patient Name: [name], [name] Date of Service: 10/17/2017 10:15 AM Medical Record Number: [id number] Patient Account Number: [id number] Date of Birth/Sex: 1930-03-10 (83 y.o. Female) Treating RN: Montey Hora Primary Care Provider: Priscille Kluver Other Clinician: Referring Provider: Priscille Kluver Treating  Provider/Extender: Frann Rider in Treatment: 1 Verbal / Phone Orders: No Diagnosis Coding Wound Cleansing Wound #1 Sacrum o Clean wound with Normal Saline. o Cleanse wound with mild soap and water o May Shower, gently pat wound dry prior to applying new dressing. Anesthetic Wound #1 Sacrum o Topical Lidocaine 4% cream applied to wound bed prior to debridement Primary Wound Dressing Wound #1 Sacrum o Santyl Ointment Secondary Dressing Wound #1 Sacrum o Dry Gauze o Boardered Foam Dressing Dressing Change Frequency Wound #1 Sacrum o Change dressing every day. Follow-up Appointments Wound #1 Sacrum o Return Appointment in 1 week. Off-Loading Wound #1 Sacrum o Mattress - Continue air mattress o Turn and reposition every 2 hours Additional Orders / Instructions Wound #1 Sacrum o Increase protein intake. o Activity as tolerated o Other: - Please add vitamin A, vitamin C, multivitamin and zinc supplements to your diet - available over the [place] [address] [place] Vis[facility] KEYSHAWNA, PROUSE ([id number] Nurse may visit PRN to address patientos wound care needs. o FACE TO FACE ENCOUNTER: MEDICARE and MEDICAID PATIENTS: I certify that this patient is under my care and that I had a face-to-face encounter that meets the physician face-to-face encounter requirements with this patient on this date. The encounter with the patient was in whole or in part for the following MEDICAL CONDITION: (primary reason for [place]) MEDICAL NECESSITY: I certify, that based on my findings, NURSING services are a medically necessary home health service. HOME BOUND STATUS: I certify that my clinical findings support that this patient is homebound (i.e., Due to illness or injury, pt requires aid of supportive devices such as crutches, cane, wheelchairs, walkers, the use of special transportation or  the assistance of another person to leave their place of residence. There is a normal inability to leave the home and doing so requires considerable and taxing effort. Other absences are for medical reasons / religious services and are infrequent or of short duration when for other reasons). o If current dressing causes regression in wound condition, may D/C ordered dressing product/s and apply Normal Saline Moist Dressing daily until next [place] / Other MD appointment. [place] of regression in wound condition at [phone]. o Please direct any NON-WOUND related issues/requests for orders to patient's Primary Care Physician Medications-please  add to medication list. Wound #1 Sacrum o Santyl Enzymatic Ointment Patient Medications Allergies: No Known Allergies Notifications Medication Indication Start End Santyl 10/17/2017 DOSE topical 250 unit/gram ointment - ointment topical as directed Electronic Signature(s) Signed: 10/17/2017 10:50:40 AM By: Christin Fudge MD, FACS Entered By: Christin Fudge on 10/17/2017 10:50:40 [place], [place]. ([id number]) -------------------------------------------------------------------------------- Problem List Details Patient Name: Whitner, Shakera L. Date of Service: 10/17/2017 10:15 AM Medical Record Number: [id number] Patient Account Number: [id number] Date of Birth/Sex: [birth year] (83 y.o. Female) Treating RN: Montey Hora Primary Care Provider: Priscille Kluver Other Clinician: Referring Provider: Priscille Kluver Treating Provider/Extender: Frann Rider in Treatment: 1 Active Problems ICD-10 Encounter Code Description Active Date Diagnosis L89.154 Pressure ulcer of sacral region, stage 4 10/09/2017 Yes E44.0 Moderate protein-calorie malnutrition 10/09/2017 Yes Z99.3 Dependence on wheelchair 10/09/2017 Yes Inactive Problems Resolved Problems Electronic Signature(s) Signed: 10/17/2017 10:50:52 AM By:  Christin Fudge MD, FACS Entered By: Christin Fudge on 10/17/2017 10:50:52 [place], [place]. ([id number]) -------------------------------------------------------------------------------- Progress Note Details Patient Name: Cristobal, Randalyn L. Date of Service: 10/17/2017 10:15 AM Medical Record Number: [id number] Patient Account Number: [id number] Date of Birth/Sex: 02/10/1930 (83 y.o. Female) Treating RN: Montey Hora Primary Care Provider: Priscille Kluver Other Clinician: Referring Provider: Priscille Kluver Treating Provider/Extender: Frann Rider in Treatment: 1 Subjective Chief Complaint Information obtained from Patient Patient is at the clinic for treatment of an open pressure ulcer to the sacral region which she's had since over 3 months History of Present Illness (HPI) The following HPI elements were documented for the patient's wound: Location: sacral pressure ulcer Quality: Patient reports No Pain. Severity: Patient states wound (s) are getting better. Duration: Patient has had the wound for > 3 months prior to seeking treatment at the wound center Context: The wound would happen gradually Modifying Factors: Other treatment(s) tried include:surgical debridement, IV antibiotics and occupational therapy and physical therapy Associated Signs and Symptoms: Patient reports having:appropriate treatments and home health 83 year old patient who has been referred to [place] for the sacral decubitus ulcer was admitted to the [facility] on 07/18/2017 for an acute sacral decubitus with osteomyelitis. She was treated there with IV antibiotics and surgical debridement was done on 08/06/2017 and transferred back to a swing bed 08/12/2017. She has completed antibiotics on 09/18/2017 and has been now living at home. Past medical history of GERD, fracture of right humerus, hypertension, rectal cancer, status post colostomy, laparoscopic cholecystectomy, debridement of sacral decubitus ulcer  including subcutaneous tissue muscle and bone done on 08/06/2017, previous rectal cancer surgery in April 2017. Reviewing notes from the [facility] it was noted that she was treated for a MRSA sacral osteomyelitis and Proteus bacteremia requiring IV antibiotics which included clindamycin and vancomycin. Vancomycin had to be stopped on September 25 due to neutropenia and thrombocytopenia and changed to daptomycin until 09/20/2017. Infectious disease then advised to switch to clindamycin and the PICC line was removed on 09/22/2017. review of the electronic medical records noted that cultures of bone and tissue taken from the OR for wound debridement on 95 grew MRSA and she was started on IV antibiotics initially clindamycin and switched to IV vancomycin and the Proteus bacteremia was stained treated with by mouth levofloxacin and Flagyl for 6 weeks to end on 09/03/2017. She was also advised an offloading mattress and would follow-up with supportive care 10/17/2017 -- the patient is ambulating a bit with the help of OT and PT and she is also eating much better. She has got inappropriate air mattress in place. Patient  History Information obtained from Patient. Family History Cancer - Child, No family history of Diabetes, Heart Disease, Hereditary Spherocytosis, Hypertension, Kidney Disease, Lung Disease, Seizures, Stroke, Thyroid Problems, Tuberculosis. Social History Never smoker, Marital Status - Married, Alcohol Use - Never, Drug Use - No History, Caffeine Use - Daily. Tanelle, Lanzo Yevette L. ([id number]) Medical And Surgical History Notes Gastrointestinal colostomy since 1990 Oncologic colorectal - 1990 Objective Constitutional Pulse regular. Respirations normal and unlabored. Afebrile. Vitals Time Taken: 10:29 AM, Height: 64 in, Weight: 200 lbs, BMI: 34.3, Temperature: 97.6 F, Pulse: 52 bpm, Respiratory Rate: 16 breaths/min, Blood Pressure: 191/56 mmHg. Eyes Nonicteric. Reactive to  light. Ears, Nose, Mouth, and Throat Lips, teeth, and gums WNL.Marland Kitchen Moist mucosa without lesions. Neck supple and nontender. No palpable supraclavicular or cervical adenopathy. Normal sized without goiter. Respiratory WNL. No retractions.. Cardiovascular Pedal Pulses WNL. No clubbing, cyanosis or edema. Lymphatic No adneopathy. No adenopathy. No adenopathy. Musculoskeletal Adexa without tenderness or enlargement.. Digits and nails w/o clubbing, cyanosis, infection, petechiae, ischemia, or inflammatory conditions.Marland Kitchen Psychiatric Judgement and insight Intact.. No evidence of depression, anxiety, or agitation.. General Notes: the sacral decubitus ulcer still has significant amount of undermining and there is some fibrotic debris at the edges which will continue to use Santyl ointment locally. I was not able to palpate any bone. Integumentary (Hair, Skin) No suspicious lesions. No crepitus or fluctuance. No peri-wound warmth or erythema. No masses.. Wound #1 status is Open. Original cause of wound was Pressure Injury. The wound is located on the Sacrum. The wound measures 1cm length x 0.8cm width x 1.3cm depth; 0.628cm^2 area and 0.817cm^3 volume. There is tendon, Fat Layer Hume, Nika L. ([id number]) (Subcutaneous Tissue) Exposed, and fascia exposed. There is no tunneling or undermining noted. There is a large amount of serous drainage noted. The wound margin is flat and intact. There is large (67-100%) red granulation within the wound bed. There is a small (1-33%) amount of necrotic tissue within the wound bed including Eschar and Adherent Slough. The periwound skin appearance exhibited: Scarring. The periwound skin appearance did not exhibit: Callus, Crepitus, Excoriation, Induration, Rash, Dry/Scaly, Maceration, Atrophie Blanche, Cyanosis, Ecchymosis, Hemosiderin Staining, Mottled, Pallor, Rubor, Erythema. Periwound temperature was noted as No Abnormality. Assessment Active  Problems ICD-10 L89.154 - Pressure ulcer of sacral region, stage 4 E44.0 - Moderate protein-calorie malnutrition Z99.3 - Dependence on wheelchair Plan Wound Cleansing: Wound #1 Sacrum: Clean wound with Normal Saline. Cleanse wound with mild soap and water May Shower, gently pat wound dry prior to applying new dressing. Anesthetic: Wound #1 Sacrum: Topical Lidocaine 4% cream applied to wound bed prior to debridement Primary Wound Dressing: Wound #1 Sacrum: Santyl Ointment Secondary Dressing: Wound #1 Sacrum: Dry Gauze Boardered Foam Dressing Dressing Change Frequency: Wound #1 Sacrum: Change dressing every day. Follow-up Appointments: Wound #1 Sacrum: Return Appointment in 1 week. Off-Loading: Wound #1 Sacrum: Mattress - Continue air mattress Turn and reposition every 2 hours Additional Orders / Instructions: Wound #1 Sacrum: Increase protein intake. Activity as tolerated Other: - Please add vitamin A, vitamin C, multivitamin and zinc supplements to your diet - available over the counter Home Health: Wound #1 Sacrum: YAA, DONNELLAN ([id number]) [place] Nurse may visit PRN to address patient s wound care needs. FACE TO FACE ENCOUNTER: MEDICARE and MEDICAID PATIENTS: I certify that this patient is under my care and that I had a face-to-face encounter that meets the physician face-to-face encounter requirements with this patient on this date. The encounter with the  patient was in whole or in part for the following MEDICAL CONDITION: (primary reason for [place]) MEDICAL NECESSITY: I certify, that based on my findings, NURSING services are a medically necessary home health service. HOME BOUND STATUS: I certify that my clinical findings support that this patient is homebound (i.e., Due to illness or injury, pt requires aid of supportive devices such as crutches, cane, wheelchairs, walkers, the use of special transportation or the  assistance of another person to leave their place of residence. There is a normal inability to leave the home and doing so requires considerable and taxing effort. Other absences are for medical reasons / religious services and are infrequent or of short duration when for other reasons). If current dressing causes regression in wound condition, may D/C ordered dressing product/s and apply Normal Saline Moist Dressing daily until next [place] / Other MD appointment. [place] of regression in wound condition at [phone]. Please direct any NON-WOUND related issues/requests for orders to patient's Primary Care Physician Medications-please add to medication list.: Wound #1 Sacrum: Santyl Enzymatic Ointment The following medication(s) was prescribed: Santyl topical 250 unit/gram ointment ointment topical as directed starting 10/17/2017 After review today I have recommended: 1. Santyl ointment with local packing to be changed daily over the next week and covered with a bordered foam. 2. offloading has been discussed in great detail and we have discussed frequent mobilization and working with OT and PT 3. Adequate protein, vitamin A, vitamin C and zinc 4. Regular visits the wound center She and her caregiver have had all questions answered and we have discussed the possibility of switching over to a wound VAC system once the wound is a little cleaner. Electronic Signature(s) Signed: 10/17/2017 10:53:13 AM By: Christin Fudge MD, FACS Entered By: Christin Fudge on 10/17/2017 10:53:13 [place], Vora L. ([id number]) -------------------------------------------------------------------------------- ROS/PFSH Details Patient Name: [name], Madaleine L. Date of Service: 10/17/2017 10:15 AM Medical Record Number: [id number] Patient Account Number: [id number] Date of Birth/Sex: October 08, 1930 (83 y.o. Female) Treating RN: Montey Hora Primary Care Provider: Priscille Kluver Other  Clinician: Referring Provider: Priscille Kluver Treating Provider/Extender: Frann Rider in Treatment: 1 Information Obtained From Patient Wound History Do you currently have one or more open woundso Yes How many open wounds do you currently haveo 1 Approximately how long have you had your woundso 3 months How have you been treating your wound(s) until nowo santyl Has your wound(s) ever healed and then re-openedo No Have you had any lab work done in the past montho Yes Who ordered the lab work [facility] Have you tested positive for an antibiotic resistant organism (MRSA, VRE)o No Have you tested positive for osteomyelitis (bone infection)o Yes Have you had any tests for circulation on your legso No Hematologic/Lymphatic Medical History: Positive for: Anemia Negative for: Hemophilia; Human Immunodeficiency Virus; Lymphedema; Sickle Cell Disease Respiratory Medical History: Negative for: Aspiration; Asthma; Chronic Obstructive Pulmonary Disease (COPD); Pneumothorax; Sleep Apnea; Tuberculosis Cardiovascular Medical History: Positive for: Congestive Heart Failure; Hypertension Negative for: Angina; Arrhythmia; Coronary Artery Disease; Deep Vein Thrombosis; Hypotension; Myocardial Infarction; Peripheral Arterial Disease; Peripheral Venous Disease Gastrointestinal Medical History: Negative for: Cirrhosis ; Colitis; Crohnos; Hepatitis A; Hepatitis B Past Medical History Notes: colostomy since 1990 Immunological Medical History: Negative for: Lupus Erythematosus; Raynaudos; Scleroderma Musculoskeletal Medical History: Positive for: Osteoarthritis; Osteomyelitis Schlichting, Shaylen L. ([id number]) Negative for: Gout; Rheumatoid Arthritis Neurologic Medical History: Negative for: Dementia; Neuropathy; Quadriplegia Oncologic Medical History: Positive for: Received Chemotherapy; Received Radiation Past Medical History  Notes: colorectal -  1990 Immunizations Pneumococcal Vaccine: Received Pneumococcal Vaccination: Yes Immunization Notes: up to date Implantable Devices Family and Social History Cancer: Yes - Child; Diabetes: No; Heart Disease: No; Hereditary Spherocytosis: No; Hypertension: No; Kidney Disease: No; Lung Disease: No; Seizures: No; Stroke: No; Thyroid Problems: No; Tuberculosis: No; Never smoker; Marital Status - Married; Alcohol Use: Never; Drug Use: No History; Caffeine Use: Daily; Financial Concerns: No; Food, Clothing or Shelter Needs: No; Support System Lacking: No; Transportation Concerns: No; Advanced Directives: Yes (Not Provided); Patient does not want information on Advanced Directives; Living Will: Yes (Not Provided); Medical Power of Attorney: Yes - son - Sumaya Riedesel (Not Provided) Physician Affirmation I have reviewed and agree with the above information. Electronic Signature(s) Signed: 10/17/2017 4:27:13 PM By: Christin Fudge MD, FACS Signed: 10/17/2017 5:02:13 PM By: Montey Hora Entered By: Christin Fudge on 10/17/2017 10:51:47 Mcmurry, Lavina L. ([id number]) -------------------------------------------------------------------------------- SuperBill Details Patient Name: [name], Cynda L. Date of Service: 10/17/2017 Medical Record Number: [id number] Patient Account Number: [id number] Date of Birth/Sex: 1930-05-12 (83 y.o. Female) Treating RN: Montey Hora Primary Care Provider: Priscille Kluver Other Clinician: Referring Provider: Priscille Kluver Treating Provider/Extender: Frann Rider in Treatment: 1 Diagnosis Coding ICD-10 Codes Code Description L89.154 Pressure ulcer of sacral region, stage 4 E44.0 Moderate protein-calorie malnutrition Z99.3 Dependence on wheelchair Facility Procedures CPT4 Code: [id number] Description: [place] VISIT-LEV 3 EST PT Modifier: Quantity: 1 Physician Procedures CPT4 Code: [id number] Description: 84536 - WC PHYS LEVEL 3 - EST PT ICD-10  Diagnosis Description L89.154 Pressure ulcer of sacral region, stage 4 E44.0 Moderate protein-calorie malnutrition Z99.3 Dependence on wheelchair Modifier: Quantity: 1 Electronic Signature(s) Signed: 10/17/2017 10:53:31 AM By: Christin Fudge MD, FACS Entered By: Christin Fudge on 10/17/2017 10:53:31

## 2017-10-19 NOTE — Progress Notes (Signed)
DORALYN, KIRKES ([id number]) Visit Report for 10/17/2017 Arrival Information Details Patient Name: [name], [name] Date of Service: 10/17/2017 10:15 AM Medical Record Number: [id number] Patient Account Number: [id number] Date of Birth/Sex: [birth year] (83 y.o. Female) Treating RN: Montey Hora Primary Care Erinn Mendosa: Priscille Kluver Other Clinician: Referring Kloe Oates: Priscille Kluver Treating Annais Crafts/Extender: Frann Rider in Treatment: 1 Visit Information History Since Last Visit Added or deleted any medications: No Patient Arrived: Wheel Chair Any new allergies or adverse reactions: No Arrival Time: 10:21 Had a fall or experienced change in No Accompanied By: caregiver activities of daily living that may affect Transfer Assistance: Manual risk of falls: Patient Identification Verified: Yes Signs or symptoms of abuse/neglect since last visito No Secondary Verification Process Completed: Yes Hospitalized since last visit: No Has Dressing in Place as Prescribed: Yes Pain Present Now: No Electronic Signature(s) Signed: 10/17/2017 5:02:13 PM By: Montey Hora Entered By: Montey Hora on 10/17/2017 10:28:29 Fronczak, Nekayla L. ([id number]) -------------------------------------------------------------------------------- Clinic Level of Care Assessment Details Patient Name: [name], [name] Date of Service: 10/17/2017 10:15 AM Medical Record Number: [id number] Patient Account Number: [id number] Date of Birth/Sex: 23-Jan-1930 (83 y.o. Female) Treating RN: Montey Hora Primary Care [place]: Priscille Kluver Other Clinician: Referring Chadrick Sprinkle: Priscille Kluver Treating Starlynn Klinkner/Extender: Frann Rider in Treatment: 1 Clinic Level of Care Assessment Items TOOL 4 Quantity Score []  - Use when only an EandM is performed on FOLLOW-UP visit 0 ASSESSMENTS - Nursing Assessment / Reassessment X - Reassessment of Co-morbidities (includes updates in patient status) 1 10 X- 1  5 Reassessment of Adherence to Treatment Plan ASSESSMENTS - Wound and Skin Assessment / Reassessment X - Simple Wound Assessment / Reassessment - one wound 1 5 []  - 0 Complex Wound Assessment / Reassessment - multiple wounds []  - 0 Dermatologic / Skin Assessment (not related to wound area) ASSESSMENTS - Focused Assessment []  - Circumferential Edema Measurements - multi extremities 0 []  - 0 Nutritional Assessment / Counseling / Intervention []  - 0 Lower Extremity Assessment (monofilament, tuning fork, pulses) []  - 0 Peripheral Arterial Disease Assessment (using hand held doppler) ASSESSMENTS - Ostomy and/or Continence Assessment and Care []  - Incontinence Assessment and Management 0 []  - 0 Ostomy Care Assessment and Management (repouching, etc.) PROCESS - Coordination of Care X - Simple Patient / Family Education for ongoing care 1 15 []  - 0 Complex (extensive) Patient / Family Education for ongoing care []  - 0 Staff obtains Programmer, systems, Records, Test Results / Process Orders []  - 0 Staff telephones HHA, Nursing Homes / Clarify orders / etc []  - 0 Routine Transfer to another Facility (non-emergent condition) []  - 0 Routine Hospital Admission (non-emergent condition) []  - 0 New Admissions / Biomedical engineer / Ordering NPWT, Apligraf, etc. []  - 0 Emergency Hospital Admission (emergent condition) X- 1 10 Simple Discharge Coordination [place], Shiree L. ([id number]) []  - 0 Complex (extensive) Discharge Coordination PROCESS - Special Needs []  - Pediatric / Minor Patient Management 0 []  - 0 Isolation Patient Management []  - 0 Hearing / Language / Visual special needs []  - 0 Assessment of Community assistance (transportation, D/C planning, etc.) []  - 0 Additional assistance / Altered mentation []  - 0 Support Surface(s) Assessment (bed, cushion, seat, etc.) INTERVENTIONS - Wound Cleansing / Measurement X - Simple Wound Cleansing - one wound 1 5 []  - 0 Complex Wound  Cleansing - multiple wounds X- 1 5 Wound Imaging (photographs - any number of wounds) []  - 0 Wound Tracing (instead of photographs) X- 1 5 Simple Wound Measurement -  one wound []  - 0 Complex Wound Measurement - multiple wounds INTERVENTIONS - Wound Dressings X - Small Wound Dressing one or multiple wounds 1 10 []  - 0 Medium Wound Dressing one or multiple wounds []  - 0 Large Wound Dressing one or multiple wounds X- 1 5 Application of Medications - topical []  - 0 Application of Medications - injection INTERVENTIONS - Miscellaneous []  - External ear exam 0 []  - 0 Specimen Collection (cultures, biopsies, blood, body fluids, etc.) []  - 0 Specimen(s) / Culture(s) sent or taken to Lab for analysis []  - 0 Patient Transfer (multiple staff / Civil Service fast streamer / Similar devices) []  - 0 Simple Staple / Suture removal (25 or less) []  - 0 Complex Staple / Suture removal (26 or more) []  - 0 Hypo / Hyperglycemic Management (close monitor of Blood Glucose) []  - 0 Ankle / Brachial Index (ABI) - do not check if billed separately X- 1 5 Vital Signs Goldberg, Azucena L. ([id number]) Has the patient been seen at the hospital within the last three years: Yes Total Score: 80 Level Of Care: New/Established - Level 3 Electronic Signature(s) Signed: 10/17/2017 5:02:13 PM By: Montey Hora Entered By: Montey Hora on 10/17/2017 10:45:35 [place], [place] ([id number]) -------------------------------------------------------------------------------- Encounter Discharge Information Details Patient Name: [name] Hansen, [name] Date of Service: 10/17/2017 10:15 AM Medical Record Number: [id number] Patient Account Number: [id number] Date of Birth/Sex: 06/15/1930 (83 y.o. Female) Treating RN: Montey Hora Primary Care Obed Samek: Priscille Kluver Other Clinician: Referring Lauryn Lizardi: Priscille Kluver Treating Shamieka Gullo/Extender: Frann Rider in Treatment: 1 Encounter Discharge Information Items Discharge Pain  Level: 0 Discharge Condition: Stable Ambulatory Status: Wheelchair Discharge Destination: Home Transportation: Private Auto Accompanied By: caregiver Schedule Follow-up Appointment: Yes Medication Reconciliation completed and No provided to Patient/Care Trayton Szabo: Provided on Clinical Summary of Care: 10/17/2017 Form Type Recipient Paper Patient RA Electronic Signature(s) Signed: 10/17/2017 2:10:34 PM By: Montey Hora Entered By: Montey Hora on 10/17/2017 14:10:34 Ginther, [place] ([id number]) -------------------------------------------------------------------------------- Multi Wound Chart Details Patient Name: [name] Hansen, Mylisa L. Date of Service: 10/17/2017 10:15 AM Medical Record Number: [id number] Patient Account Number: [id number] Date of Birth/Sex: [birth year] (83 y.o. Female) Treating RN: Montey Hora Primary Care Teyton Pattillo: Priscille Kluver Other Clinician: Referring Breely Panik: Priscille Kluver Treating Fares Ramthun/Extender: Frann Rider in Treatment: 1 Vital Signs Height(in): 64 Pulse(bpm): 52 Weight(lbs): 200 Blood Pressure(mmHg): 191/56 Body Mass Index(BMI): 34 Temperature(F): 97.6 Respiratory Rate 16 (breaths/min): Photos: [1:No Photos] [N/A:N/A] Wound Location: [1:Sacrum] [N/A:N/A] Wounding Event: [1:Pressure Injury] [N/A:N/A] Primary Etiology: [1:Pressure Ulcer] [N/A:N/A] Comorbid History: [1:Anemia, Congestive Heart Failure, Hypertension, Osteoarthritis, Osteomyelitis, Received Chemotherapy, Received Radiation] [N/A:N/A] Date Acquired: [1:07/14/2017] [N/A:N/A] Weeks of Treatment: [1:1] [N/A:N/A] Wound Status: [1:Open] [N/A:N/A] Measurements L x W x D [1:1x0.8x1.3] [N/A:N/A] (cm) Area (cm) : [1:0.628] [N/A:N/A] Volume (cm) : [1:0.817] [N/A:N/A] % Reduction in Area: [1:44.50%] [N/A:N/A] % Reduction in Volume: [1:44.40%] [N/A:N/A] Classification: [1:Category/Stage IV] [N/A:N/A] Exudate Amount: [1:Large] [N/A:N/A] Exudate Type: [1:Serous]  [N/A:N/A] Exudate Color: [1:amber] [N/A:N/A] Wound Margin: [1:Flat and Intact] [N/A:N/A] Granulation Amount: [1:Large (67-100%)] [N/A:N/A] Granulation Quality: [1:Red] [N/A:N/A] Necrotic Amount: [1:Small (1-33%)] [N/A:N/A] Necrotic Tissue: [1:Eschar, Adherent Slough] [N/A:N/A] Exposed Structures: [1:Fascia: Yes Fat Layer (Subcutaneous Tissue) Exposed: Yes Tendon: Yes Muscle: No Joint: No Bone: No] [N/A:N/A] Epithelialization: [1:None] [N/A:N/A] Periwound Skin Texture: [1:Scarring: Yes Excoriation: No] [N/A:N/A] Induration: No Callus: No Crepitus: No Rash: No Periwound Skin Moisture: Maceration: No N/A N/A Dry/Scaly: No Periwound Skin Color: Atrophie Blanche: No N/A N/A Cyanosis: No Ecchymosis: No Erythema: No Hemosiderin Staining: No Mottled: No Pallor: No Rubor: No Temperature: No  Abnormality N/A N/A Tenderness on Palpation: No N/A N/A Wound Preparation: Ulcer Cleansing: N/A N/A Rinsed/Irrigated with Saline Topical Anesthetic Applied: Other: lidocaine 4% Treatment Notes Electronic Signature(s) Signed: 10/17/2017 10:50:56 AM By: Christin Fudge MD, FACS Entered By: Christin Fudge on 10/17/2017 10:50:56 Hil[place], [place] CreekMarland Kitchen ([id number]) -------------------------------------------------------------------------------- [place] Details Patient Name: [name] Hansen, Dorothie L. Date of Service: 10/17/2017 10:15 AM Medical Record Number: [id number] Patient Account Number: [id number] Date of Birth/Sex: Sep 01, 1930 (83 y.o. Female) Treating RN: Montey Hora Primary Care Jerelyn Trimarco: Priscille Kluver Other Clinician: Referring Baudelio Karnes: Priscille Kluver Treating Yuan Gann/Extender: Frann Rider in Treatment: 1 Active Inactive ` Abuse / Safety / Falls / Self Care Management Nursing Diagnoses: Potential for falls Goals: Patient will not experience any injury related to falls Date Initiated: 10/09/2017 Target Resolution Date: 01/10/2018 Goal Status:  Active Interventions: Assess fall risk on admission and as needed Notes: ` Orientation to the Wound Care Program Nursing Diagnoses: Knowledge deficit related to the wound healing center program Goals: Patient/caregiver will verbalize understanding of the [facility] Program Date Initiated: 10/09/2017 Target Resolution Date: 01/10/2018 Goal Status: Active Interventions: Provide education on orientation to the wound center Notes: ` Pressure Nursing Diagnoses: Potential for impaired tissue integrity related to pressure, friction, moisture, and shear Goals: Patient will remain free from development of additional pressure ulcers Date Initiated: 10/09/2017 Target Resolution Date: 01/10/2018 Goal Status: Active Interventions: Aideliz, Garmany Magali L. ([id number]) Assess offloading mechanisms upon admission and as needed Provide education on pressure ulcers Notes: ` Wound/Skin Impairment Nursing Diagnoses: Impaired tissue integrity Goals: Ulcer/skin breakdown will heal within 14 weeks Date Initiated: 10/09/2017 Target Resolution Date: 01/10/2018 Goal Status: Active Interventions: Assess patient/caregiver ability to obtain necessary supplies Assess patient/caregiver ability to perform ulcer/skin care regimen upon admission and as needed Assess ulceration(s) every visit Notes: Electronic Signature(s) Signed: 10/17/2017 5:02:13 PM By: Montey Hora Entered By: Montey Hora on 10/17/2017 10:37:53 Kooy, Lateka L. ([id number]) -------------------------------------------------------------------------------- Pain Assessment Details Patient Name: [name] Hansen, Rakel L. Date of Service: 10/17/2017 10:15 AM Medical Record Number: [id number] Patient Account Number: [id number] Date of Birth/Sex: May 23, 1930 (83 y.o. Female) Treating RN: Montey Hora Primary Care Kedra Mcglade: Priscille Kluver Other Clinician: Referring Arnaldo Heffron: Priscille Kluver Treating Nijae Doyel/Extender: Frann Rider in  Treatment: 1 Active Problems Location of Pain Severity and Description of Pain Patient Has Paino Yes Site Locations Pain Location: Pain in Ulcers With Dressing Change: Yes Duration of the Pain. Constant / Intermittento Constant Character of Pain Describe the Pain: Other: sore Pain Management and Medication Current Pain Management: Notes Topical or injectable lidocaine is offered to patient for acute pain when surgical debridement is performed. If needed, Patient is instructed to use over the counter pain medication for the following 24-48 hours after debridement. Wound care MDs do not prescribed pain medications. Patient has chronic pain or uncontrolled pain. Patient has been instructed to make an appointment with their Primary Care Physician for pain management. Electronic Signature(s) Signed: 10/17/2017 5:02:13 PM By: Montey Hora Entered By: Montey Hora on 10/17/2017 10:28:55 Bohan, Diasia Carlean Jews ([id number]) -------------------------------------------------------------------------------- Patient/Caregiver Education Details Patient Name: [name] Hansen, Linzy L. Date of Service: 10/17/2017 10:15 AM Medical Record Number: [id number] Patient Account Number: [id number] Date of Birth/Gender: 10-07-1930 (83 y.o. Female) Treating RN: Montey Hora Primary Care Physician: Priscille Kluver Other Clinician: Referring Physician: Priscille Kluver Treating Physician/Extender: Frann Rider in Treatment: 1 Education Assessment Education Provided To: Patient and Caregiver Education Topics Provided Wound/Skin Impairment: Handouts: Other: wound care as ordered Methods: Demonstration, Explain/Verbal Responses: State content correctly Electronic Signature(s) Signed: 10/17/2017 5:02:13  PM By: Montey Hora Entered By: Montey Hora on 10/17/2017 14:10:51 [place], [place] ([id number]) -------------------------------------------------------------------------------- Wound Assessment  Details Patient Name: Mcginness, Celinda L. Date of Service: 10/17/2017 10:15 AM Medical Record Number: [id number] Patient Account Number: [id number] Date of Birth/Sex: November 18, 1930 (83 y.o. Female) Treating RN: Montey Hora Primary Care Petrice Beedy: Priscille Kluver Other Clinician: Referring Bryceton Hantz: Priscille Kluver Treating Chyrl Elwell/Extender: Frann Rider in Treatment: 1 Wound Status Wound Number: 1 Primary Pressure Ulcer Etiology: Wound Location: Sacrum Wound Open Wounding Event: Pressure Injury Status: Date Acquired: 07/14/2017 Comorbid Anemia, Congestive Heart Failure, Weeks Of Treatment: 1 History: Hypertension, Osteoarthritis, Osteomyelitis, Clustered Wound: No Received Chemotherapy, Received Radiation Photos Photo Uploaded By: Montey Hora on 10/17/2017 14:25:41 Wound Measurements Length: (cm) 1 Width: (cm) 0.8 Depth: (cm) 1.3 Area: (cm) 0.628 Volume: (cm) 0.817 % Reduction in Area: 44.5% % Reduction in Volume: 44.4% Epithelialization: None Tunneling: No Undermining: No Wound Description Classification: Category/Stage IV Wound Margin: Flat and Intact Exudate Amount: Large Exudate Type: Serous Exudate Color: amber Foul Odor After Cleansing: No Slough/Fibrino Yes Wound Bed Granulation Amount: Large (67-100%) Exposed Structure Granulation Quality: Red Fascia Exposed: Yes Necrotic Amount: Small (1-33%) Fat Layer (Subcutaneous Tissue) Exposed: Yes Necrotic Quality: Eschar, Adherent Slough Tendon Exposed: Yes Muscle Exposed: No Joint Exposed: No Bone Exposed: No Periwound Skin Texture Lagunes, Vernell L. ([id number]) Texture Color No Abnormalities Noted: No No Abnormalities Noted: No Callus: No Atrophie Blanche: No Crepitus: No Cyanosis: No Excoriation: No Ecchymosis: No Induration: No Erythema: No Rash: No Hemosiderin Staining: No Scarring: Yes Mottled: No Pallor: No Moisture Rubor: No No Abnormalities Noted: No Dry / Scaly: No Temperature /  Pain Maceration: No Temperature: No Abnormality Wound Preparation Ulcer Cleansing: Rinsed/Irrigated with Saline Topical Anesthetic Applied: Other: lidocaine 4%, Treatment Notes Wound #1 (Sacrum) 1. Cleansed with: Clean wound with Normal Saline 2. Anesthetic Topical Lidocaine 4% cream to wound bed prior to debridement 3. Peri-wound Care: Skin Prep 4. Dressing Applied: Santyl Ointment 5. Secondary Dressing Applied Bordered Foam Dressing Dry Gauze Electronic Signature(s) Signed: 10/17/2017 5:02:13 PM By: Montey Hora Entered By: Montey Hora on 10/17/2017 10:37:37 Mcjunkins, Breindel L. ([id number]) -------------------------------------------------------------------------------- Vitals Details Patient Name: [name] Hansen, Taunya L. Date of Service: 10/17/2017 10:15 AM Medical Record Number: [id number] Patient Account Number: [id number] Date of Birth/Sex: 10/15/1930 (83 y.o. Female) Treating RN: Montey Hora Primary Care Lennis Korb: Priscille Kluver Other Clinician: Referring Myrikal Messmer: Priscille Kluver Treating Todd Argabright/Extender: Frann Rider in Treatment: 1 Vital Signs Time Taken: 10:29 Temperature (F): 97.6 Height (in): 64 Pulse (bpm): 52 Weight (lbs): 200 Respiratory Rate (breaths/min): 16 Body Mass Index (BMI): 34.3 Blood Pressure (mmHg): 191/56 Reference Range: 80 - 120 mg / dl Electronic Signature(s) Signed: 10/17/2017 5:02:13 PM By: Montey Hora Entered By: Montey Hora on 10/17/2017 10:30:30

## 2017-10-30 ENCOUNTER — Encounter: Payer: Medicare Other | Admitting: Surgery

## 2017-10-30 DIAGNOSIS — L89154 Pressure ulcer of sacral region, stage 4: Secondary | ICD-10-CM | POA: Diagnosis not present

## 2017-11-02 NOTE — Progress Notes (Signed)
[name] ([id number]) Visit Report for 10/30/2017 Arrival Information Details Patient Name: [name], [name] Date of Service: 10/30/2017 1:30 PM Medical Record Number: [id number] Patient Account Number: [id number] Date of Birth/Sex: June 25, 1930 (83 y.o. Female) Treating RN: Montey Hora Primary Care Olof Marcil: Priscille Kluver Other Clinician: Referring Tamisha Nordstrom: Priscille Kluver Treating Shauntavia Brackin/Extender: Frann Rider in Treatment: 3 Visit Information History Since Last Visit Added or deleted any medications: No Patient Arrived: Wheel Chair Any new allergies or adverse reactions: No Arrival Time: 13:43 Had a fall or experienced change in No Accompanied By: ffriend activities of daily living that may affect Transfer Assistance: Manual risk of falls: Patient Identification Verified: Yes Signs or symptoms of abuse/neglect since last visito No Secondary Verification Process Completed: Yes Hospitalized since last visit: No Has Dressing in Place as Prescribed: Yes Pain Present Now: No Electronic Signature(s) Signed: 10/31/2017 4:46:33 PM By: Montey Hora Entered By: Montey Hora on 10/30/2017 13:43:30 Pickert, Chaz L. ([id number]) -------------------------------------------------------------------------------- Clinic Level of Care Assessment Details Patient Name: Ghuman, Donnah L. Date of Service: 10/30/2017 1:30 PM Medical Record Number: [id number] Patient Account Number: [id number] Date of Birth/Sex: [birth year] (83 y.o. Female) Treating RN: Montey Hora Primary Care Laticha Ferrucci: Priscille Kluver Other Clinician: Referring Jentzen Minasyan: Priscille Kluver Treating Lanier Millon/Extender: Frann Rider in Treatment: 3 Clinic Level of Care Assessment Items TOOL 4 Quantity Score []  - Use when only an EandM is performed on FOLLOW-UP visit 0 ASSESSMENTS - Nursing Assessment / Reassessment X - Reassessment of Co-morbidities (includes updates in patient status) 1 10 X- 1  5 Reassessment of Adherence to Treatment Plan ASSESSMENTS - Wound and Skin Assessment / Reassessment X - Simple Wound Assessment / Reassessment - one wound 1 5 []  - 0 Complex Wound Assessment / Reassessment - multiple wounds []  - 0 Dermatologic / Skin Assessment (not related to wound area) ASSESSMENTS - Focused Assessment []  - Circumferential Edema Measurements - multi extremities 0 []  - 0 Nutritional Assessment / Counseling / Intervention []  - 0 Lower Extremity Assessment (monofilament, tuning fork, pulses) []  - 0 Peripheral Arterial Disease Assessment (using hand held doppler) ASSESSMENTS - Ostomy and/or Continence Assessment and Care []  - Incontinence Assessment and Management 0 []  - 0 Ostomy Care Assessment and Management (repouching, etc.) PROCESS - Coordination of Care X - Simple Patient / Family Education for ongoing care 1 15 []  - 0 Complex (extensive) Patient / Family Education for ongoing care []  - 0 Staff obtains Programmer, systems, Records, Test Results / Process Orders []  - 0 Staff telephones HHA, Nursing Homes / Clarify orders / etc []  - 0 Routine Transfer to another Facility (non-emergent condition) []  - 0 Routine Hospital Admission (non-emergent condition) []  - 0 New Admissions / Biomedical engineer / Ordering NPWT, Apligraf, etc. []  - 0 Emergency Hospital Admission (emergent condition) X- 1 10 Simple Discharge Coordination Petree, Kree L. ([id number]) []  - 0 Complex (extensive) Discharge Coordination PROCESS - Special Needs []  - Pediatric / Minor Patient Management 0 []  - 0 Isolation Patient Management []  - 0 Hearing / Language / Visual special needs []  - 0 Assessment of Community assistance (transportation, D/C planning, etc.) []  - 0 Additional assistance / Altered mentation []  - 0 Support Surface(s) Assessment (bed, cushion, seat, etc.) INTERVENTIONS - Wound Cleansing / Measurement X - Simple Wound Cleansing - one wound 1 5 []  - 0 Complex Wound  Cleansing - multiple wounds X- 1 5 Wound Imaging (photographs - any number of wounds) []  - 0 Wound Tracing (instead of photographs) X- 1 5 Simple Wound Measurement -  one wound []  - 0 Complex Wound Measurement - multiple wounds INTERVENTIONS - Wound Dressings X - Small Wound Dressing one or multiple wounds 1 10 []  - 0 Medium Wound Dressing one or multiple wounds []  - 0 Large Wound Dressing one or multiple wounds X- 1 5 Application of Medications - topical []  - 0 Application of Medications - injection INTERVENTIONS - Miscellaneous []  - External ear exam 0 []  - 0 Specimen Collection (cultures, biopsies, blood, body fluids, etc.) []  - 0 Specimen(s) / Culture(s) sent or taken to Lab for analysis []  - 0 Patient Transfer (multiple staff / Civil Service fast streamer / Similar devices) []  - 0 Simple Staple / Suture removal (25 or less) []  - 0 Complex Staple / Suture removal (26 or more) []  - 0 Hypo / Hyperglycemic Management (close monitor of Blood Glucose) []  - 0 Ankle / Brachial Index (ABI) - do not check if billed separately X- 1 5 Vital Signs Gross, Nathaly L. ([id number]) Has the patient been seen at the hospital within the last three years: Yes Total Score: 80 Level Of Care: New/Established - Level 3 Electronic Signature(s) Signed: 10/31/2017 4:46:33 PM By: Montey Hora Entered By: Montey Hora on 10/30/2017 14:54:24 Casaus, [place] ([id number]) -------------------------------------------------------------------------------- Encounter Discharge Information Details Patient Name: [name] Hansen, Haizel L. Date of Service: 10/30/2017 1:30 PM Medical Record Number: [id number] Patient Account Number: [id number] Date of Birth/Sex: February 17, 1930 (83 y.o. Female) Treating RN: Montey Hora Primary Care Quency Tober: Priscille Kluver Other Clinician: Referring Anntoinette Haefele: Priscille Kluver Treating Malin Cervini/Extender: Frann Rider in Treatment: 3 Encounter Discharge Information Items Discharge Pain  Level: 0 Discharge Condition: Stable Ambulatory Status: Wheelchair Discharge Destination: Home Private Transportation: Auto Accompanied By: caregiver Schedule Follow-up Appointment: Yes Medication Reconciliation completed and provided No to Patient/Care Ezri Landers: Clinical Summary of Care: Electronic Signature(s) Signed: 10/30/2017 2:55:22 PM By: Montey Hora Entered By: Montey Hora on 10/30/2017 14:55:22 Dyches, [place] ([id number]) -------------------------------------------------------------------------------- Multi Wound Chart Details Patient Name: [name] Hansen, Alyce L. Date of Service: 10/30/2017 1:30 PM Medical Record Number: [id number] Patient Account Number: [id number] Date of Birth/Sex: 1930-06-26 (83 y.o. Female) Treating RN: Montey Hora Primary Care Icarus Partch: Priscille Kluver Other Clinician: Referring Juniper Snyders: Priscille Kluver Treating Signa Cheek/Extender: Frann Rider in Treatment: 3 Vital Signs Height(in): 64 Pulse(bpm): 17 Weight(lbs): 200 Blood Pressure(mmHg): 170/48 Body Mass Index(BMI): 34 Temperature(F): 97.8 Respiratory Rate 16 (breaths/min): Photos: [1:No Photos] [N/A:N/A] Wound Location: [1:Sacrum] [N/A:N/A] Wounding Event: [1:Pressure Injury] [N/A:N/A] Primary Etiology: [1:Pressure Ulcer] [N/A:N/A] Comorbid History: [1:Anemia, Congestive Heart Failure, Hypertension, Osteoarthritis, Osteomyelitis, Received Chemotherapy, Received Radiation] [N/A:N/A] Date Acquired: [1:07/14/2017] [N/A:N/A] Weeks of Treatment: [1:3] [N/A:N/A] Wound Status: [1:Open] [N/A:N/A] Measurements L x W x D [1:1.2x0.6x1.5] [N/A:N/A] (cm) Area (cm) : [1:0.565] [N/A:N/A] Volume (cm) : [1:0.848] [N/A:N/A] % Reduction in Area: [1:50.00%] [N/A:N/A] % Reduction in Volume: [1:42.30%] [N/A:N/A] Starting Position 1 [1:9] (o'clock): Ending Position 1 [1:5] (o'clock): Maximum Distance 1 (cm): [1:2.1] Undermining: [1:Yes] [N/A:N/A] Classification: [1:Category/Stage IV]  [N/A:N/A] Exudate Amount: [1:Large] [N/A:N/A] Exudate Type: [1:Serous] [N/A:N/A] Exudate Color: [1:amber] [N/A:N/A] Wound Margin: [1:Flat and Intact] [N/A:N/A] Granulation Amount: [1:Large (67-100%)] [N/A:N/A] Granulation Quality: [1:Red] [N/A:N/A] Necrotic Amount: [1:Small (1-33%)] [N/A:N/A] Necrotic Tissue: [1:Eschar, Adherent Slough] [N/A:N/A] Exposed Structures: [1:Fascia: Yes Fat Layer (Subcutaneous Tissue) Exposed: Yes Tendon: Yes] [N/A:N/A] Muscle: No Joint: No Bone: No Epithelialization: None N/A N/A Periwound Skin Texture: Scarring: Yes N/A N/A Excoriation: No Induration: No Callus: No Crepitus: No Rash: No Periwound Skin Moisture: Maceration: No N/A N/A Dry/Scaly: No Periwound Skin Color: Atrophie Blanche: No N/A N/A Cyanosis: No Ecchymosis: No Erythema: No  Hemosiderin Staining: No Mottled: No Pallor: No Rubor: No Temperature: No Abnormality N/A N/A Tenderness on Palpation: No N/A N/A Wound Preparation: Ulcer Cleansing: N/A N/A Rinsed/Irrigated with Saline Topical Anesthetic Applied: Other: lidocaine 4% Treatment Notes Electronic Signature(s) Signed: 10/30/2017 2:04:09 PM By: Christin Fudge MD, FACS Entered By: Christin Fudge on 10/30/2017 14:04:09 Lonsdale, [place] L. ([id number]) -------------------------------------------------------------------------------- [place] Details Patient Name: [name] Hansen, Shanen L. Date of Service: 10/30/2017 1:30 PM Medical Record Number: [id number] Patient Account Number: [id number] Date of Birth/Sex: [birth year] (83 y.o. Female) Treating RN: Montey Hora Primary Care Auriana Scalia: Priscille Kluver Other Clinician: Referring Alisah Grandberry: Priscille Kluver Treating Elie Leppo/Extender: Frann Rider in Treatment: 3 Active Inactive ` Abuse / Safety / Falls / Self Care Management Nursing Diagnoses: Potential for falls Goals: Patient will not experience any injury related to falls Date Initiated: 10/09/2017 Target  Resolution Date: 01/10/2018 Goal Status: Active Interventions: Assess fall risk on admission and as needed Notes: ` Orientation to the Wound Care Program Nursing Diagnoses: Knowledge deficit related to the wound healing center program Goals: Patient/caregiver will verbalize understanding of the [facility] Program Date Initiated: 10/09/2017 Target Resolution Date: 01/10/2018 Goal Status: Active Interventions: Provide education on orientation to the wound center Notes: ` Pressure Nursing Diagnoses: Potential for impaired tissue integrity related to pressure, friction, moisture, and shear Goals: Patient will remain free from development of additional pressure ulcers Date Initiated: 10/09/2017 Target Resolution Date: 01/10/2018 Goal Status: Active Interventions: Angeline, Trick Latoria L. ([id number]) Assess offloading mechanisms upon admission and as needed Provide education on pressure ulcers Notes: ` Wound/Skin Impairment Nursing Diagnoses: Impaired tissue integrity Goals: Ulcer/skin breakdown will heal within 14 weeks Date Initiated: 10/09/2017 Target Resolution Date: 01/10/2018 Goal Status: Active Interventions: Assess patient/caregiver ability to obtain necessary supplies Assess patient/caregiver ability to perform ulcer/skin care regimen upon admission and as needed Assess ulceration(s) every visit Notes: Electronic Signature(s) Signed: 10/31/2017 4:46:33 PM By: Montey Hora Entered By: Montey Hora on 10/30/2017 13:58:38 Vanorman, Allisa L. ([id number]) -------------------------------------------------------------------------------- Pain Assessment Details Patient Name: [name] Hansen, Liv L. Date of Service: 10/30/2017 1:30 PM Medical Record Number: [id number] Patient Account Number: [id number] Date of Birth/Sex: 1930/08/24 (83 y.o. Female) Treating RN: Montey Hora Primary Care Camie Hauss: Priscille Kluver Other Clinician: Referring Tarrance Januszewski: Priscille Kluver Treating  Denya Buckingham/Extender: Frann Rider in Treatment: 3 Active Problems Location of Pain Severity and Description of Pain Patient Has Paino No Site Locations Pain Management and Medication Current Pain Management: Notes Topical or injectable lidocaine is offered to patient for acute pain when surgical debridement is performed. If needed, Patient is instructed to use over the counter pain medication for the following 24-48 hours after debridement. Wound care MDs do not prescribed pain medications. Patient has chronic pain or uncontrolled pain. Patient has been instructed to make an appointment with their Primary Care Physician for pain management. Electronic Signature(s) Signed: 10/31/2017 4:46:33 PM By: Montey Hora Entered By: Montey Hora on 10/30/2017 13:43:45 Nadel, Takeesha L. ([id number]) -------------------------------------------------------------------------------- Patient/Caregiver Education Details Patient Name: [name] Hansen, Declan L. Date of Service: 10/30/2017 1:30 PM Medical Record Number: [id number] Patient Account Number: [id number] Date of Birth/Gender: 01/03/1930 (83 y.o. Female) Treating RN: Montey Hora Primary Care Physician: Priscille Kluver Other Clinician: Referring Physician: Priscille Kluver Treating Physician/Extender: Frann Rider in Treatment: 3 Education Assessment Education Provided To: Patient and Caregiver Education Topics Provided Wound/Skin Impairment: Handouts: Other: wound care as ordered Methods: Demonstration, Explain/Verbal Responses: State content correctly Electronic Signature(s) Signed: 10/31/2017 4:46:33 PM By: Montey Hora Entered By: Montey Hora on 10/30/2017 14:55:43 Stowell, Rembrandt (  [id number]) -------------------------------------------------------------------------------- Wound Assessment Details Patient Name: JAQUESS, Glorimar L. Date of Service: 10/30/2017 1:30 PM Medical Record Number: [id number] Patient Account  Number: [id number] Date of Birth/Sex: 1930/03/27 (83 y.o. Female) Treating RN: Montey Hora Primary Care Macintyre [name]: Priscille Kluver Other Clinician: Referring Novella Abraha: Priscille Kluver Treating Laquanta Hummel/Extender: Frann Rider in Treatment: 3 Wound Status Wound Number: 1 Primary Pressure Ulcer Etiology: Wound Location: Sacrum Wound Open Wounding Event: Pressure Injury Status: Date Acquired: 07/14/2017 Comorbid Anemia, Congestive Heart Failure, Weeks Of Treatment: 3 History: Hypertension, Osteoarthritis, Osteomyelitis, Clustered Wound: No Received Chemotherapy, Received Radiation Photos Photo Uploaded By: Montey Hora on 10/31/2017 12:05:01 Wound Measurements Length: (cm) 1.2 Width: (cm) 0.6 Depth: (cm) 1.5 Area: (cm) 0.565 Volume: (cm) 0.848 % Reduction in Area: 50% % Reduction in Volume: 42.3% Epithelialization: None Tunneling: No Undermining: Yes Starting Position (o'clock): 9 Ending Position (o'clock): 5 Maximum Distance: (cm) 2.1 Wound Description Classification: Category/Stage IV Wound Margin: Flat and Intact Exudate Amount: Large Exudate Type: Serous Exudate Color: amber Foul Odor After Cleansing: No Slough/Fibrino Yes Wound Bed Granulation Amount: Large (67-100%) Exposed Structure Granulation Quality: Red Fascia Exposed: Yes Necrotic Amount: Small (1-33%) Fat Layer (Subcutaneous Tissue) Exposed: Yes Necrotic Quality: Eschar, Adherent Slough Tendon Exposed: Yes Muscle Exposed: No Horrell, Frieda L. ([id number]) Joint Exposed: No Bone Exposed: No Periwound Skin Texture Texture Color No Abnormalities Noted: No No Abnormalities Noted: No Callus: No Atrophie Blanche: No Crepitus: No Cyanosis: No Excoriation: No Ecchymosis: No Induration: No Erythema: No Rash: No Hemosiderin Staining: No Scarring: Yes Mottled: No Pallor: No Moisture Rubor: No No Abnormalities Noted: No Dry / Scaly: No Temperature / Pain Maceration: No Temperature: No  Abnormality Wound Preparation Ulcer Cleansing: Rinsed/Irrigated with Saline Topical Anesthetic Applied: Other: lidocaine 4%, Treatment Notes Wound #1 (Sacrum) 1. Cleansed with: Clean wound with Normal Saline 2. Anesthetic Topical Lidocaine 4% cream to wound bed prior to debridement 4. Dressing Applied: Santyl Ointment 5. Secondary Dressing Applied Bordered Foam Dressing Dry Gauze Electronic Signature(s) Signed: 10/31/2017 4:46:33 PM By: Montey Hora Entered By: Montey Hora on 10/30/2017 13:53:33 [place], [place] ([id number]) -------------------------------------------------------------------------------- Vitals Details Patient Name: [name] Hansen, Starlett L. Date of Service: 10/30/2017 1:30 PM Medical Record Number: [id number] Patient Account Number: [id number] Date of Birth/Sex: 17-Jun-1930 (83 y.o. Female) Treating RN: Montey Hora Primary Care Gawain Crombie: Priscille Kluver Other Clinician: Referring Fidencia Mccloud: Priscille Kluver Treating Nakul Avino/Extender: Frann Rider in Treatment: 3 Vital Signs Time Taken: 13:43 Temperature (F): 97.8 Height (in): 64 Pulse (bpm): 49 Weight (lbs): 200 Respiratory Rate (breaths/min): 16 Body Mass Index (BMI): 34.3 Blood Pressure (mmHg): 170/48 Reference Range: 80 - 120 mg / dl Electronic Signature(s) Signed: 10/31/2017 4:46:33 PM By: Montey Hora Entered By: Montey Hora on 10/30/2017 13:45:16

## 2017-11-02 NOTE — Progress Notes (Signed)
MAYLANI, EMBREE ([id number]) Visit Report for 10/30/2017 Chief Complaint Document Details Patient Name: [name], [name] Date of Service: 10/30/2017 1:30 PM Medical Record Number: [id number] Patient Account Number: [id number] Date of Birth/Sex: 1930-01-29 (83 y.o. Female) Treating RN: Montey Hora Primary Care Provider: Priscille Kluver Other Clinician: Referring Provider: Priscille Kluver Treating Provider/Extender: Frann Rider in Treatment: 3 Information Obtained from: Patient Chief Complaint Patient is at the clinic for treatment of an open pressure ulcer to the sacral region which she's had since over 3 months Electronic Signature(s) Signed: 10/30/2017 2:04:16 PM By: Christin Fudge MD, FACS Entered By: Christin Fudge on 10/30/2017 14:04:16 [place], Merwin L. ([id number]) -------------------------------------------------------------------------------- HPI Details Patient Name: [name], [name] Date of Service: 10/30/2017 1:30 PM Medical Record Number: [id number] Patient Account Number: [id number] Date of Birth/Sex: 01-06-1930 (83 y.o. Female) Treating RN: Montey Hora Primary Care Provider: Priscille Kluver Other Clinician: Referring Provider: Priscille Kluver Treating Provider/Extender: Frann Rider in Treatment: 3 History of Present Illness Location: sacral pressure ulcer Quality: Patient reports No Pain. Severity: Patient states wound (s) are getting better. Duration: Patient has had the wound for > 3 months prior to seeking treatment at the wound center Context: The wound would happen gradually Modifying Factors: Other treatment(s) tried include:surgical debridement, IV antibiotics and occupational therapy and physical therapy Associated Signs and Symptoms: Patient reports having:appropriate treatments and home health HPI Description: 83 year old patient who has been referred to [place] for the sacral decubitus ulcer was admitted to the [facility] on 07/18/2017 for  an acute sacral decubitus with osteomyelitis. She was treated there with IV antibiotics and surgical debridement was done on 08/06/2017 and transferred back to a swing bed 08/12/2017. She has completed antibiotics on 09/18/2017 and has been now living at home. Past medical history of GERD, fracture of right humerus, hypertension, rectal cancer, status post colostomy, laparoscopic cholecystectomy, debridement of sacral decubitus ulcer including subcutaneous tissue muscle and bone done on 08/06/2017, previous rectal cancer surgery in April 2017. Reviewing notes from the [facility] it was noted that she was treated for a MRSA sacral osteomyelitis and Proteus bacteremia requiring IV antibiotics which included clindamycin and vancomycin. Vancomycin had to be stopped on September 25 due to neutropenia and thrombocytopenia and changed to daptomycin until 09/20/2017. Infectious disease then advised to switch to clindamycin and the PICC line was removed on 09/22/2017. review of the electronic medical records noted that cultures of bone and tissue taken from the OR for wound debridement on 95 grew MRSA and she was started on IV antibiotics initially clindamycin and switched to IV vancomycin and the Proteus bacteremia was stained treated with by mouth levofloxacin and Flagyl for 6 weeks to end on 09/03/2017. She was also advised an offloading mattress and would follow-up with supportive care 10/17/2017 -- the patient is ambulating a bit with the help of OT and PT and she is also eating much better. She has got inappropriate air mattress in place. Electronic Signature(s) Signed: 10/30/2017 2:04:21 PM By: Christin Fudge MD, FACS Entered By: Christin Fudge on 10/30/2017 14:04:20 [name], [name] ([id number]) -------------------------------------------------------------------------------- Physical Exam Details Patient Name: [name], [name] Date of Service: 10/30/2017 1:30 PM Medical Record Number:  [id number] Patient Account Number: [id number] Date of Birth/Sex: 1930-03-21 (83 y.o. Female) Treating RN: Montey Hora Primary Care Provider: Priscille Kluver Other Clinician: Referring Provider: Priscille Kluver Treating Provider/Extender: Frann Rider in Treatment: 3 Constitutional . Pulse regular. Respirations normal and unlabored. Afebrile. . Eyes Nonicteric. Reactive to light. Ears, Nose, Mouth, and Throat  Lips, teeth, and gums WNL.Marland Kitchen Moist mucosa without lesions. Neck supple and nontender. No palpable supraclavicular or cervical adenopathy. Normal sized without goiter. Respiratory WNL. No retractions.. Breath sounds WNL, No rubs, rales, rhonchi, or wheeze.. Cardiovascular Heart rhythm and rate regular, no murmur or gallop.. Pedal Pulses WNL. No clubbing, cyanosis or edema. Chest Breasts symmetical and no nipple discharge.. Breast tissue WNL, no masses, lumps, or tenderness.. Lymphatic No adneopathy. No adenopathy. No adenopathy. Musculoskeletal Adexa without tenderness or enlargement.. Digits and nails w/o clubbing, cyanosis, infection, petechiae, ischemia, or inflammatory conditions.. Integumentary (Hair, Skin) No suspicious lesions. No crepitus or fluctuance. No peri-wound warmth or erythema. No masses.Marland Kitchen Psychiatric Judgement and insight Intact.. No evidence of depression, anxiety, or agitation.. Notes the sacral decubitus ulcer still has some fibrotic and necrotic debris which is going to be treated with Santyl ointment locally and though I do not feel bone there is some cartilage-like structure palpable between the 6 and 12:00 position. No sharp debridement was done Electronic Signature(s) Signed: 10/30/2017 2:05:55 PM By: Christin Fudge MD, FACS Entered By: Christin Fudge on 10/30/2017 14:05:54 [place], [place]. ([id number]) -------------------------------------------------------------------------------- Physician Orders Details Patient Name: [name], [name] Date of  Service: 10/30/2017 1:30 PM Medical Record Number: [id number] Patient Account Number: [id number] Date of Birth/Sex: Mar 03, 1930 (83 y.o. Female) Treating RN: Montey Hora Primary Care Provider: Priscille Kluver Other Clinician: Referring Provider: Priscille Kluver Treating Provider/Extender: Frann Rider in Treatment: 3 Verbal / Phone Orders: No Diagnosis Coding Wound Cleansing Wound #1 Sacrum o Clean wound with Normal Saline. o Cleanse wound with mild soap and water o May Shower, gently pat wound dry prior to applying new dressing. Anesthetic Wound #1 Sacrum o Topical Lidocaine 4% cream applied to wound bed prior to debridement Primary Wound Dressing Wound #1 Sacrum o Santyl Ointment Secondary Dressing Wound #1 Sacrum o Dry Gauze o Boardered Foam Dressing Dressing Change Frequency Wound #1 Sacrum o Change dressing every day. Follow-up Appointments Wound #1 Sacrum o Return Appointment in 1 week. Off-Loading Wound #1 Sacrum o Mattress - Continue air mattress o Turn and reposition every 2 hours Additional Orders / Instructions Wound #1 Sacrum o Increase protein intake. o Activity as tolerated o Other: - Please add vitamin A, vitamin C, multivitamin and zinc supplements to your diet - available over the [place] #1 [place] Visits [name], MATSUOKA ([id number] Nurse may visit PRN to address patientos wound care needs. o FACE TO FACE ENCOUNTER: MEDICARE and MEDICAID PATIENTS: I certify that this patient is under my care and that I had a face-to-face encounter that meets the physician face-to-face encounter requirements with this patient on this date. The encounter with the patient was in whole or in part for the following MEDICAL CONDITION: (primary reason for [place]) MEDICAL NECESSITY: I certify, that based on my findings, NURSING services are a medically necessary home health  service. HOME BOUND STATUS: I certify that my clinical findings support that this patient is homebound (i.e., Due to illness or injury, pt requires aid of supportive devices such as crutches, cane, wheelchairs, walkers, the use of special transportation or the assistance of another person to leave their place of residence. There is a normal inability to leave the home and doing so requires considerable and taxing effort. Other absences are for medical reasons / religious services and are infrequent or of short duration when for other reasons). o If current dressing causes regression in wound condition, may D/C ordered dressing  product/s and apply Normal Saline Moist Dressing daily until next [place] / Other MD appointment. [place] of regression in wound condition at [phone]. o Please direct any NON-WOUND related issues/requests for orders to patient's Primary Care Physician Medications-please add to medication list. Wound #1 Sacrum o Santyl Enzymatic Ointment Electronic Signature(s) Signed: 10/30/2017 4:36:05 PM By: Christin Fudge MD, FACS Signed: 10/31/2017 4:46:33 PM By: Montey Hora Entered By: Montey Hora on 10/30/2017 14:03:40 [place], Raseel L. ([id number]) -------------------------------------------------------------------------------- Problem List Details Patient Name: Pugmire, Banessa L. Date of Service: 10/30/2017 1:30 PM Medical Record Number: [id number] Patient Account Number: [id number] Date of Birth/Sex: 09-Jun-1930 (83 y.o. Female) Treating RN: Montey Hora Primary Care Provider: Priscille Kluver Other Clinician: Referring Provider: Priscille Kluver Treating Provider/Extender: Frann Rider in Treatment: 3 Active Problems ICD-10 Encounter Code Description Active Date Diagnosis L89.154 Pressure ulcer of sacral region, stage 4 10/09/2017 Yes E44.0 Moderate protein-calorie malnutrition 10/09/2017 Yes Z99.3 Dependence on  wheelchair 10/09/2017 Yes Inactive Problems Resolved Problems Electronic Signature(s) Signed: 10/30/2017 2:04:04 PM By: Christin Fudge MD, FACS Entered By: Christin Fudge on 10/30/2017 14:04:04 [place], [place]. ([id number]) -------------------------------------------------------------------------------- Progress Note Details Patient Name: [name], [name] Date of Service: 10/30/2017 1:30 PM Medical Record Number: [id number] Patient Account Number: [id number] Date of Birth/Sex: 1930-10-13 (83 y.o. Female) Treating RN: Montey Hora Primary Care Provider: Priscille Kluver Other Clinician: Referring Provider: Priscille Kluver Treating Provider/Extender: Frann Rider in Treatment: 3 Subjective Chief Complaint Information obtained from Patient Patient is at the clinic for treatment of an open pressure ulcer to the sacral region which she's had since over 3 months History of Present Illness (HPI) The following HPI elements were documented for the patient's wound: Location: sacral pressure ulcer Quality: Patient reports No Pain. Severity: Patient states wound (s) are getting better. Duration: Patient has had the wound for > 3 months prior to seeking treatment at the wound center Context: The wound would happen gradually Modifying Factors: Other treatment(s) tried include:surgical debridement, IV antibiotics and occupational therapy and physical therapy Associated Signs and Symptoms: Patient reports having:appropriate treatments and home health 83 year old patient who has been referred to [place] for the sacral decubitus ulcer was admitted to the [facility] on 07/18/2017 for an acute sacral decubitus with osteomyelitis. She was treated there with IV antibiotics and surgical debridement was done on 08/06/2017 and transferred back to a swing bed 08/12/2017. She has completed antibiotics on 09/18/2017 and has been now living at home. Past medical history of GERD, fracture of right humerus,  hypertension, rectal cancer, status post colostomy, laparoscopic cholecystectomy, debridement of sacral decubitus ulcer including subcutaneous tissue muscle and bone done on 08/06/2017, previous rectal cancer surgery in April 2017. Reviewing notes from the [facility] it was noted that she was treated for a MRSA sacral osteomyelitis and Proteus bacteremia requiring IV antibiotics which included clindamycin and vancomycin. Vancomycin had to be stopped on September 25 due to neutropenia and thrombocytopenia and changed to daptomycin until 09/20/2017. Infectious disease then advised to switch to clindamycin and the PICC line was removed on 09/22/2017. review of the electronic medical records noted that cultures of bone and tissue taken from the OR for wound debridement on 95 grew MRSA and she was started on IV antibiotics initially clindamycin and switched to IV vancomycin and the Proteus bacteremia was stained treated with by mouth levofloxacin and Flagyl for 6 weeks to end on 09/03/2017. She was also advised an offloading mattress and would follow-up with supportive care 10/17/2017 -- the patient  is ambulating a bit with the help of OT and PT and she is also eating much better. She has got inappropriate air mattress in place. Patient History Information obtained from Patient. Family History Cancer - Child, No family history of Diabetes, Heart Disease, Hereditary Spherocytosis, Hypertension, Kidney Disease, Lung Disease, Seizures, Stroke, Thyroid Problems, Tuberculosis. Social History Never smoker, Marital Status - Married, Alcohol Use - Never, Drug Use - No History, Caffeine Use - Daily. [name], [name] Gilma L. ([id number]) Medical And Surgical History Notes Gastrointestinal colostomy since 1990 Oncologic colorectal - 1990 Objective Constitutional Pulse regular. Respirations normal and unlabored. Afebrile. Vitals Time Taken: 1:43 PM, Height: 64 in, Weight: 200 lbs, BMI: 34.3, Temperature:  97.8 F, Pulse: 49 bpm, Respiratory Rate: 16 breaths/min, Blood Pressure: 170/48 mmHg. Eyes Nonicteric. Reactive to light. Ears, Nose, Mouth, and Throat Lips, teeth, and gums WNL.Marland Kitchen Moist mucosa without lesions. Neck supple and nontender. No palpable supraclavicular or cervical adenopathy. Normal sized without goiter. Respiratory WNL. No retractions.. Breath sounds WNL, No rubs, rales, rhonchi, or wheeze.. Cardiovascular Heart rhythm and rate regular, no murmur or gallop.. Pedal Pulses WNL. No clubbing, cyanosis or edema. Chest Breasts symmetical and no nipple discharge.. Breast tissue WNL, no masses, lumps, or tenderness.. Lymphatic No adneopathy. No adenopathy. No adenopathy. Musculoskeletal Adexa without tenderness or enlargement.. Digits and nails w/o clubbing, cyanosis, infection, petechiae, ischemia, or inflammatory conditions.Marland Kitchen Psychiatric Judgement and insight Intact.. No evidence of depression, anxiety, or agitation.. General Notes: the sacral decubitus ulcer still has some fibrotic and necrotic debris which is going to be treated with Santyl ointment locally and though I do not feel bone there is some cartilage-like structure palpable between the 6 and 12:00 position. No sharp debridement was done Integumentary (Hair, Skin) [name], [name] ([id number]) No suspicious lesions. No crepitus or fluctuance. No peri-wound warmth or erythema. No masses.. Wound #1 status is Open. Original cause of wound was Pressure Injury. The wound is located on the Sacrum. The wound measures 1.2cm length x 0.6cm width x 1.5cm depth; 0.565cm^2 area and 0.848cm^3 volume. There is tendon, Fat Layer (Subcutaneous Tissue) Exposed, and fascia exposed. There is no tunneling noted, however, there is undermining starting at 9:00 and ending at 5:00 with a maximum distance of 2.1cm. There is a large amount of serous drainage noted. The wound margin is flat and intact. There is large (67-100%) red  granulation within the wound bed. There is a small (1-33%) amount of necrotic tissue within the wound bed including Eschar and Adherent Slough. The periwound skin appearance exhibited: Scarring. The periwound skin appearance did not exhibit: Callus, Crepitus, Excoriation, Induration, Rash, Dry/Scaly, Maceration, Atrophie Blanche, Cyanosis, Ecchymosis, Hemosiderin Staining, Mottled, Pallor, Rubor, Erythema. Periwound temperature was noted as No Abnormality. Assessment Active Problems ICD-10 L89.154 - Pressure ulcer of sacral region, stage 4 E44.0 - Moderate protein-calorie malnutrition Z99.3 - Dependence on wheelchair Plan Wound Cleansing: Wound #1 Sacrum: Clean wound with Normal Saline. Cleanse wound with mild soap and water May Shower, gently pat wound dry prior to applying new dressing. Anesthetic: Wound #1 Sacrum: Topical Lidocaine 4% cream applied to wound bed prior to debridement Primary Wound Dressing: Wound #1 Sacrum: Santyl Ointment Secondary Dressing: Wound #1 Sacrum: Dry Gauze Boardered Foam Dressing Dressing Change Frequency: Wound #1 Sacrum: Change dressing every day. Follow-up Appointments: Wound #1 Sacrum: Return Appointment in 1 week. Off-Loading: Wound #1 Sacrum: Mattress - Continue air mattress Turn and reposition every 2 hours Additional Orders / Instructions: Wound #1 Sacrum: [name], [name] ([id number]) Increase protein intake. Activity  as tolerated Other: - Please add vitamin A, vitamin C, multivitamin and zinc supplements to your diet - available over the counter Home Health: Wound #1 Sacrum: [place] Nurse may visit PRN to address patient s wound care needs. FACE TO FACE ENCOUNTER: MEDICARE and MEDICAID PATIENTS: I certify that this patient is under my care and that I had a face-to-face encounter that meets the physician face-to-face encounter requirements with this patient on this date. The encounter with the  patient was in whole or in part for the following MEDICAL CONDITION: (primary reason for [place]) MEDICAL NECESSITY: I certify, that based on my findings, NURSING services are a medically necessary home health service. HOME BOUND STATUS: I certify that my clinical findings support that this patient is homebound (i.e., Due to illness or injury, pt requires aid of supportive devices such as crutches, cane, wheelchairs, walkers, the use of special transportation or the assistance of another person to leave their place of residence. There is a normal inability to leave the home and doing so requires considerable and taxing effort. Other absences are for medical reasons / religious services and are infrequent or of short duration when for other reasons). If current dressing causes regression in wound condition, may D/C ordered dressing product/s and apply Normal Saline Moist Dressing daily until next [place] / Other MD appointment. [place] of regression in wound condition at [phone]. Please direct any NON-WOUND related issues/requests for orders to patient's Primary Care Physician Medications-please add to medication list.: Wound #1 Sacrum: Santyl Enzymatic Ointment After review today I have recommended: 1. Santyl ointment with local packing to be changed daily over the next week and covered with a bordered foam. 2. offloading has been discussed in great detail and we have discussed frequent mobilization and working with OT and PT 3. Adequate protein, vitamin A, vitamin C and zinc 4. Regular visits the wound center She is not yet ready to switching over to a wound VAC system, and we will do this once the wound is a little cleaner. Electronic Signature(s) Signed: 10/30/2017 2:07:19 PM By: Christin Fudge MD, FACS Entered By: Christin Fudge on 10/30/2017 14:07:19 [place], [place].  ([id number]) -------------------------------------------------------------------------------- ROS/PFSH Details Patient Name: [name], Medora L. Date of Service: 10/30/2017 1:30 PM Medical Record Number: [id number] Patient Account Number: [id number] Date of Birth/Sex: 1930/04/22 (83 y.o. Female) Treating RN: Montey Hora Primary Care Provider: Priscille Kluver Other Clinician: Referring Provider: Priscille Kluver Treating Provider/Extender: Frann Rider in Treatment: 3 Information Obtained From Patient Wound History Do you currently have one or more open woundso Yes How many open wounds do you currently haveo 1 Approximately how long have you had your woundso 3 months How have you been treating your wound(s) until nowo santyl Has your wound(s) ever healed and then re-openedo No Have you had any lab work done in the past montho Yes Who ordered the lab work [facility] Have you tested positive for an antibiotic resistant organism (MRSA, VRE)o No Have you tested positive for osteomyelitis (bone infection)o Yes Have you had any tests for circulation on your legso No Hematologic/Lymphatic Medical History: Positive for: Anemia Negative for: Hemophilia; Human Immunodeficiency Virus; Lymphedema; Sickle Cell Disease Respiratory Medical History: Negative for: Aspiration; Asthma; Chronic Obstructive Pulmonary Disease (COPD); Pneumothorax; Sleep Apnea; Tuberculosis Cardiovascular Medical History: Positive for: Congestive Heart Failure; Hypertension Negative for: Angina; Arrhythmia; Coronary Artery Disease; Deep Vein Thrombosis; Hypotension; Myocardial Infarction; Peripheral Arterial Disease; Peripheral Venous Disease Gastrointestinal  Medical History: Negative for: Cirrhosis ; Colitis; Crohnos; Hepatitis A; Hepatitis B Past Medical History Notes: colostomy since 1990 Immunological Medical History: Negative for: Lupus Erythematosus; Raynaudos;  Scleroderma Musculoskeletal Medical History: Positive for: Osteoarthritis; Osteomyelitis [name], [name] ([id number]) Negative for: Gout; Rheumatoid Arthritis Neurologic Medical History: Negative for: Dementia; Neuropathy; Quadriplegia Oncologic Medical History: Positive for: Received Chemotherapy; Received Radiation Past Medical History Notes: colorectal - 1990 Immunizations Pneumococcal Vaccine: Received Pneumococcal Vaccination: Yes Immunization Notes: up to date Implantable Devices Family and Social History Cancer: Yes - Child; Diabetes: No; Heart Disease: No; Hereditary Spherocytosis: No; Hypertension: No; Kidney Disease: No; Lung Disease: No; Seizures: No; Stroke: No; Thyroid Problems: No; Tuberculosis: No; Never smoker; Marital Status - Married; Alcohol Use: Never; Drug Use: No History; Caffeine Use: Daily; Financial Concerns: No; Food, Clothing or Shelter Needs: No; Support System Lacking: No; Transportation Concerns: No; Advanced Directives: Yes (Not Provided); Patient does not want information on Advanced Directives; Living Will: Yes (Not Provided); Medical Power of Attorney: Yes - son - Laneka Mcgrory (Not Provided) Physician Affirmation I have reviewed and agree with the above information. Electronic Signature(s) Signed: 10/30/2017 4:36:05 PM By: Christin Fudge MD, FACS Signed: 10/31/2017 4:46:33 PM By: Montey Hora Entered By: Christin Fudge on 10/30/2017 14:04:28 [name], [name] ([id number]) -------------------------------------------------------------------------------- SuperBill Details Patient Name: [name], Ahmyah L. Date of Service: 10/30/2017 Medical Record Number: [id number] Patient Account Number: [id number] Date of Birth/Sex: 12/13/1929 (83 y.o. Female) Treating RN: Montey Hora Primary Care Provider: Priscille Kluver Other Clinician: Referring Provider: Priscille Kluver Treating Provider/Extender: Frann Rider in Treatment: 3 Diagnosis  Coding ICD-10 Codes Code Description L89.154 Pressure ulcer of sacral region, stage 4 E44.0 Moderate protein-calorie malnutrition Z99.3 Dependence on wheelchair Facility Procedures CPT4 Code: [id number] Description: [place] VISIT-LEV 3 EST PT Modifier: Quantity: 1 Physician Procedures CPT4 Code: 1950932 Description: 67124 - WC PHYS LEVEL 3 - EST PT ICD-10 Diagnosis Description L89.154 Pressure ulcer of sacral region, stage 4 E44.0 Moderate protein-calorie malnutrition Z99.3 Dependence on wheelchair Modifier: Quantity: 1 Electronic Signature(s) Signed: 10/30/2017 2:54:31 PM By: Montey Hora Signed: 10/30/2017 4:36:05 PM By: Christin Fudge MD, FACS Previous Signature: 10/30/2017 2:07:36 PM Version By: Christin Fudge MD, FACS Entered By: Montey Hora on 10/30/2017 14:54:30

## 2017-11-06 ENCOUNTER — Encounter: Payer: Medicare Other | Attending: Surgery | Admitting: Surgery

## 2017-11-06 DIAGNOSIS — I509 Heart failure, unspecified: Secondary | ICD-10-CM | POA: Diagnosis not present

## 2017-11-06 DIAGNOSIS — Z993 Dependence on wheelchair: Secondary | ICD-10-CM | POA: Insufficient documentation

## 2017-11-06 DIAGNOSIS — Z7982 Long term (current) use of aspirin: Secondary | ICD-10-CM | POA: Diagnosis not present

## 2017-11-06 DIAGNOSIS — E440 Moderate protein-calorie malnutrition: Secondary | ICD-10-CM | POA: Diagnosis not present

## 2017-11-06 DIAGNOSIS — M1990 Unspecified osteoarthritis, unspecified site: Secondary | ICD-10-CM | POA: Diagnosis not present

## 2017-11-06 DIAGNOSIS — Z79899 Other long term (current) drug therapy: Secondary | ICD-10-CM | POA: Insufficient documentation

## 2017-11-06 DIAGNOSIS — I110 Hypertensive heart disease with heart failure: Secondary | ICD-10-CM | POA: Insufficient documentation

## 2017-11-06 DIAGNOSIS — L89154 Pressure ulcer of sacral region, stage 4: Secondary | ICD-10-CM | POA: Diagnosis not present

## 2017-11-08 NOTE — Progress Notes (Signed)
CACHET, MCCUTCHEN ([id number]) Visit Report for 11/06/2017 Chief Complaint Document Details Patient Name: [name], [name] Date of Service: 11/06/2017 12:30 PM Medical Record Number: [id number] Patient Account Number: [id number] Date of Birth/Sex: 28-Sep-1930 (83 y.o. Female) Treating RN: Montey Hora Primary Care Provider: Priscille Kluver Other Clinician: Referring Provider: Priscille Kluver Treating Provider/Extender: Frann Rider in Treatment: 4 Information Obtained from: Patient Chief Complaint Patient is at the clinic for treatment of an open pressure ulcer to the sacral region which she's had since over 3 months Electronic Signature(s) Signed: 11/06/2017 1:04:53 PM By: Christin Fudge MD, FACS Entered By: Christin Fudge on 11/06/2017 13:04:53 [name], [name]. ([id number]) -------------------------------------------------------------------------------- HPI Details Patient Name: [name], [name] Date of Service: 11/06/2017 12:30 PM Medical Record Number: [id number] Patient Account Number: [id number] Date of Birth/Sex: [birth year] (83 y.o. Female) Treating RN: Montey Hora Primary Care Provider: Priscille Kluver Other Clinician: Referring Provider: Priscille Kluver Treating Provider/Extender: Frann Rider in Treatment: 4 History of Present Illness Location: sacral pressure ulcer Quality: Patient reports No Pain. Severity: Patient states wound (s) are getting better. Duration: Patient has had the wound for > 3 months prior to seeking treatment at the wound center Context: The wound would happen gradually Modifying Factors: Other treatment(s) tried include:surgical debridement, IV antibiotics and occupational therapy and physical therapy Associated Signs and Symptoms: Patient reports having:appropriate treatments and home health HPI Description: 83 year old patient who has been referred to [place] for the sacral decubitus ulcer was admitted to the [facility] on 07/18/2017 for an  acute sacral decubitus with osteomyelitis. She was treated there with IV antibiotics and surgical debridement was done on 08/06/2017 and transferred back to a swing bed 08/12/2017. She has completed antibiotics on 09/18/2017 and has been now living at home. Past medical history of GERD, fracture of right humerus, hypertension, rectal cancer, status post colostomy, laparoscopic cholecystectomy, debridement of sacral decubitus ulcer including subcutaneous tissue muscle and bone done on 08/06/2017, previous rectal cancer surgery in April 2017. Reviewing notes from the [facility] it was noted that she was treated for a MRSA sacral osteomyelitis and Proteus bacteremia requiring IV antibiotics which included clindamycin and vancomycin. Vancomycin had to be stopped on September 25 due to neutropenia and thrombocytopenia and changed to daptomycin until 09/20/2017. Infectious disease then advised to switch to clindamycin and the PICC line was removed on 09/22/2017. review of the electronic medical records noted that cultures of bone and tissue taken from the OR for wound debridement on 95 grew MRSA and she was started on IV antibiotics initially clindamycin and switched to IV vancomycin and the Proteus bacteremia was stained treated with by mouth levofloxacin and Flagyl for 6 [name] to end on 09/03/2017. She was also advised an offloading mattress and would follow-up with supportive care 10/17/2017 -- the patient is ambulating a bit with the help of OT and PT and she is also eating much better. She has got inappropriate air mattress in place. 11/06/2017 -- the patient is a bit more ambivalent and continues to work with her mobilization. She is not yet ready for a wound VAC. Electronic Signature(s) Signed: 11/06/2017 1:05:30 PM By: Christin Fudge MD, FACS Entered By: Christin Fudge on 11/06/2017 13:05:30 [name] [name]  ([id number]) -------------------------------------------------------------------------------- Physical Exam Details Patient Name: [name], [name] Date of Service: 11/06/2017 12:30 PM Medical Record Number: [id number] Patient Account Number: [id number] Date of Birth/Sex: [birth year] (83 y.o. Female) Treating RN: Montey Hora Primary Care Provider: Priscille Kluver Other Clinician: Referring Provider: Priscille Kluver Treating Provider/Extender: [name],  [name] in Treatment: 4 Constitutional . Pulse regular. Respirations normal and unlabored. Afebrile. . Eyes Nonicteric. Reactive to light. Ears, Nose, Mouth, and Throat Lips, teeth, and gums WNL.Marland [name] Moist mucosa without lesions. Neck supple and nontender. No palpable supraclavicular or cervical adenopathy. Normal sized without goiter. Respiratory WNL. No retractions.. Cardiovascular Pedal Pulses WNL. No clubbing, cyanosis or edema. Lymphatic No adneopathy. No adenopathy. No adenopathy. Musculoskeletal Adexa without tenderness or enlargement.. Digits and nails w/o clubbing, cyanosis, infection, petechiae, ischemia, or inflammatory conditions.. Integumentary (Hair, Skin) No suspicious lesions. No crepitus or fluctuance. No peri-wound warmth or erythema. No masses.Marland [name] Psychiatric Judgement and insight Intact.. No evidence of depression, anxiety, or agitation.. Notes the patient's sacral decubitus ulcer continues to have slough which cannot be sharply debrided and we will continue to use Santyl ointment locally. There is no bone palpable. Electronic Signature(s) Signed: 11/06/2017 1:06:17 PM By: Christin Fudge MD, FACS Entered By: Christin Fudge on 11/06/2017 13:06:17 [name] ([id number]) -------------------------------------------------------------------------------- Physician Orders Details Patient Name: [name], [name] Date of Service: 11/06/2017 12:30 PM Medical Record Number: [id number] Patient Account Number:  [id number] Date of Birth/Sex: August 29, 1930 (83 y.o. Female) Treating RN: Montey Hora Primary Care Provider: Priscille Kluver Other Clinician: Referring Provider: Priscille Kluver Treating Provider/Extender: Frann Rider in Treatment: 4 Verbal / Phone Orders: No Diagnosis Coding Wound Cleansing Wound #1 Sacrum o Clean wound with Normal Saline. o Cleanse wound with mild soap and water o May Shower, gently pat wound dry prior to applying new dressing. Anesthetic Wound #1 Sacrum o Topical Lidocaine 4% cream applied to wound bed prior to debridement Primary Wound Dressing Wound #1 Sacrum o Santyl Ointment Secondary Dressing Wound #1 Sacrum o Dry Gauze - pack lightly with gauze o Boardered Foam Dressing Dressing Change Frequency Wound #1 Sacrum o Change dressing every day. Follow-up Appointments Wound #1 Sacrum o Return Appointment in 1 week. Off-Loading Wound #1 Sacrum o Mattress - Continue air mattress o Turn and reposition every 2 hours Additional Orders / Instructions Wound #1 Sacrum o Increase protein intake. o Activity as tolerated o Other: - Please add vitamin A, vitamin C, multivitamin and zinc supplements to your diet - available over the [place] [address] [place] Visits - [address] PETRELLA, Harnett ([id number]) o [place] Nurse may visit PRN to address patientos wound care needs. o FACE TO FACE ENCOUNTER: MEDICARE and MEDICAID PATIENTS: I certify that this patient is under my care and that I had a face-to-face encounter that meets the physician face-to-face encounter requirements with this patient on this date. The encounter with the patient was in whole or in part for the following MEDICAL CONDITION: (primary reason for [place]) MEDICAL NECESSITY: I certify, that based on my findings, NURSING services are a medically necessary home health service. HOME BOUND STATUS: I certify that  my clinical findings support that this patient is homebound (i.e., Due to illness or injury, pt requires aid of supportive devices such as crutches, cane, wheelchairs, walkers, the use of special transportation or the assistance of another person to leave their place of residence. There is a normal inability to leave the home and doing so requires considerable and taxing effort. Other absences are for medical reasons / religious services and are infrequent or of short duration when for other reasons). o If current dressing causes regression in wound condition, may D/C ordered dressing product/s and apply Normal Saline Moist Dressing daily until next [place] / Other MD appointment. [facility]ify  [facility] of regression in wound condition at [phone]. o Please direct any NON-WOUND related issues/requests for orders to patient's Primary Care Physician Medications-please add to medication list. Wound #1 Sacrum o Santyl Enzymatic Ointment Electronic Signature(s) Signed: 11/06/2017 4:19:51 PM By: Christin Fudge MD, FACS Signed: 11/06/2017 4:53:14 PM By: Montey Hora Entered By: Montey Hora on 11/06/2017 13:01:48 [name], [name] ([id number]) -------------------------------------------------------------------------------- Problem List Details Patient Name: [name], [name] Date of Service: 11/06/2017 12:30 PM Medical Record Number: [id number] Patient Account Number: [id number] Date of Birth/Sex: December 15, 1929 (83 y.o. Female) Treating RN: Montey Hora Primary Care Provider: Priscille Kluver Other Clinician: Referring Provider: Priscille Kluver Treating Provider/Extender: Frann Rider in Treatment: 4 Active Problems ICD-10 Encounter Code Description Active Date Diagnosis L89.154 Pressure ulcer of sacral region, stage 4 10/09/2017 Yes E44.0 Moderate protein-calorie malnutrition 10/09/2017 Yes Z99.3 Dependence on wheelchair 10/09/2017 Yes Inactive Problems Resolved  Problems Electronic Signature(s) Signed: 11/06/2017 1:04:34 PM By: Christin Fudge MD, FACS Entered By: Christin Fudge on 11/06/2017 13:04:34 [place], [place] L. ([id number]) -------------------------------------------------------------------------------- Progress Note Details Patient Name: [name], [name] Date of Service: 11/06/2017 12:30 PM Medical Record Number: [id number] Patient Account Number: [id number] Date of Birth/Sex: Aug 17, 1930 (83 y.o. Female) Treating RN: Montey Hora Primary Care Provider: Priscille Kluver Other Clinician: Referring Provider: Priscille Kluver Treating Provider/Extender: Frann Rider in Treatment: 4 Subjective Chief Complaint Information obtained from Patient Patient is at the clinic for treatment of an open pressure ulcer to the sacral region which she's had since over 3 months History of Present Illness (HPI) The following HPI elements were documented for the patient's wound: Location: sacral pressure ulcer Quality: Patient reports No Pain. Severity: Patient states wound (s) are getting better. Duration: Patient has had the wound for > 3 months prior to seeking treatment at the wound center Context: The wound would happen gradually Modifying Factors: Other treatment(s) tried include:surgical debridement, IV antibiotics and occupational therapy and physical therapy Associated Signs and Symptoms: Patient reports having:appropriate treatments and home health 83 year old patient who has been referred to [place] for the sacral decubitus ulcer was admitted to the [facility] on 07/18/2017 for an acute sacral decubitus with osteomyelitis. She was treated there with IV antibiotics and surgical debridement was done on 08/06/2017 and transferred back to a swing bed 08/12/2017. She has completed antibiotics on 09/18/2017 and has been now living at home. Past medical history of GERD, fracture of right humerus, hypertension, rectal cancer, status post colostomy,  laparoscopic cholecystectomy, debridement of sacral decubitus ulcer including subcutaneous tissue muscle and bone done on 08/06/2017, previous rectal cancer surgery in April 2017. Reviewing notes from the [facility] it was noted that she was treated for a MRSA sacral osteomyelitis and Proteus bacteremia requiring IV antibiotics which included clindamycin and vancomycin. Vancomycin had to be stopped on September 25 due to neutropenia and thrombocytopenia and changed to daptomycin until 09/20/2017. Infectious disease then advised to switch to clindamycin and the PICC line was removed on 09/22/2017. review of the electronic medical records noted that cultures of bone and tissue taken from the OR for wound debridement on 95 grew MRSA and she was started on IV antibiotics initially clindamycin and switched to IV vancomycin and the Proteus bacteremia was stained treated with by mouth levofloxacin and Flagyl for 6 [name] to end on 09/03/2017. She was also advised an offloading mattress and would follow-up with supportive care 10/17/2017 -- the patient is ambulating a bit with the help of OT and PT and she is also eating much better.  She has got inappropriate air mattress in place. 11/06/2017 -- the patient is a bit more ambivalent and continues to work with her mobilization. She is not yet ready for a wound VAC. Patient History Information obtained from Patient. Family History Cancer - Child, No family history of Diabetes, Heart Disease, Hereditary Spherocytosis, Hypertension, Kidney Disease, Lung Disease, Seizures, Stroke, Thyroid Problems, Tuberculosis. [name], [name] [name] ([id number]) Social History Never smoker, Marital Status - Married, Alcohol Use - Never, Drug Use - No History, Caffeine Use - Daily. Medical And Surgical History Notes Gastrointestinal colostomy since 1990 Oncologic colorectal - 1990 Objective Constitutional Pulse regular. Respirations normal and unlabored.  Afebrile. Vitals Time Taken: 12:43 PM, Height: 64 in, Weight: 200 lbs, BMI: 34.3, Pulse: 67 bpm, Respiratory Rate: 16 breaths/min, Blood Pressure: 196/62 mmHg. Eyes Nonicteric. Reactive to light. Ears, Nose, Mouth, and Throat Lips, teeth, and gums WNL.Marland [name] Moist mucosa without lesions. Neck supple and nontender. No palpable supraclavicular or cervical adenopathy. Normal sized without goiter. Respiratory WNL. No retractions.. Cardiovascular Pedal Pulses WNL. No clubbing, cyanosis or edema. Lymphatic No adneopathy. No adenopathy. No adenopathy. Musculoskeletal Adexa without tenderness or enlargement.. Digits and nails w/o clubbing, cyanosis, infection, petechiae, ischemia, or inflammatory conditions.Marland [name] Psychiatric Judgement and insight Intact.. No evidence of depression, anxiety, or agitation.. General Notes: the patient's sacral decubitus ulcer continues to have slough which cannot be sharply debrided and we will continue to use Santyl ointment locally. There is no bone palpable. Integumentary (Hair, Skin) No suspicious lesions. No crepitus or fluctuance. No peri-wound warmth or erythema. No masses.[name], [place] ([id number]) Wound #1 status is Open. Original cause of wound was Pressure Injury. The wound is located on the Sacrum. The wound measures 1.2cm length x 0.6cm width x 1.5cm depth; 0.565cm^2 area and 0.848cm^3 volume. There is tendon, Fat Layer (Subcutaneous Tissue) Exposed, and fascia exposed. There is no tunneling noted, however, there is undermining starting at 12:00 and ending at 12:00 with a maximum distance of 1.8cm. There is a large amount of serous drainage noted. The wound margin is flat and intact. There is large (67-100%) red granulation within the wound bed. There is a small (1-33%) amount of necrotic tissue within the wound bed including Eschar and Adherent Slough. The periwound skin appearance exhibited: Scarring. The periwound skin appearance did not exhibit:  Callus, Crepitus, Excoriation, Induration, Rash, Dry/Scaly, Maceration, Atrophie Blanche, Cyanosis, Ecchymosis, Hemosiderin Staining, Mottled, Pallor, Rubor, Erythema. Periwound temperature was noted as No Abnormality. Assessment Active Problems ICD-10 L89.154 - Pressure ulcer of sacral region, stage 4 E44.0 - Moderate protein-calorie malnutrition Z99.3 - Dependence on wheelchair Plan Wound Cleansing: Wound #1 Sacrum: Clean wound with Normal Saline. Cleanse wound with mild soap and water May Shower, gently pat wound dry prior to applying new dressing. Anesthetic: Wound #1 Sacrum: Topical Lidocaine 4% cream applied to wound bed prior to debridement Primary Wound Dressing: Wound #1 Sacrum: Santyl Ointment Secondary Dressing: Wound #1 Sacrum: Dry Gauze - pack lightly with gauze Boardered Foam Dressing Dressing Change Frequency: Wound #1 Sacrum: Change dressing every day. Follow-up Appointments: Wound #1 Sacrum: Return Appointment in 1 week. Off-Loading: Wound #1 Sacrum: Mattress - Continue air mattress Turn and reposition every 2 hours Additional Orders / Instructions: Wound #1 Sacrum: Increase protein intake. Helinski, Jahliyah L. ([id number]) Activity as tolerated Other: - Please add vitamin A, vitamin C, multivitamin and zinc supplements to your diet - available over the counter Home Health: Wound #1 Sacrum: [place] Visits - [place] Nurse may visit PRN to address  patient s wound care needs. FACE TO FACE ENCOUNTER: MEDICARE and MEDICAID PATIENTS: I certify that this patient is under my care and that I had a face-to-face encounter that meets the physician face-to-face encounter requirements with this patient on this date. The encounter with the patient was in whole or in part for the following MEDICAL CONDITION: (primary reason for [place]) MEDICAL NECESSITY: I certify, that based on my findings, NURSING services are a medically necessary  home health service. HOME BOUND STATUS: I certify that my clinical findings support that this patient is homebound (i.e., Due to illness or injury, pt requires aid of supportive devices such as crutches, cane, wheelchairs, walkers, the use of special transportation or the assistance of another person to leave their place of residence. There is a normal inability to leave the home and doing so requires considerable and taxing effort. Other absences are for medical reasons / religious services and are infrequent or of short duration when for other reasons). If current dressing causes regression in wound condition, may D/C ordered dressing product/s and apply Normal Saline Moist Dressing daily until next [place] / Other MD appointment. [place] of regression in wound condition at [phone]. Please direct any NON-WOUND related issues/requests for orders to patient's Primary Care Physician Medications-please add to medication list.: Wound #1 Sacrum: Santyl Enzymatic Ointment the patient's general health is improving slowly but she is not yet ready for a wound VAC. After review today I have recommended: 1. Santyl ointment with local packing to be changed daily over the next week and covered with a bordered foam. 2. offloading has been discussed in great detail and we have discussed frequent mobilization and working with OT and PT 3. Adequate protein, vitamin A, vitamin C and zinc 4. Regular visits the wound center Electronic Signature(s) Signed: 11/06/2017 1:06:53 PM By: Christin Fudge MD, FACS Entered By: Christin Fudge on 11/06/2017 13:06:53 [place], Vining L. ([id number]) -------------------------------------------------------------------------------- ROS/PFSH Details Patient Name: [name], Gracy L. Date of Service: 11/06/2017 12:30 PM Medical Record Number: [id number] Patient Account Number: [id number] Date of Birth/Sex: 1930-06-08 (83 y.o. Female) Treating RN:  Montey Hora Primary Care Provider: Priscille Kluver Other Clinician: Referring Provider: Priscille Kluver Treating Provider/Extender: Frann Rider in Treatment: 4 Information Obtained From Patient Wound History Do you currently have one or more open woundso Yes How many open wounds do you currently haveo 1 Approximately how long have you had your woundso 3 months How have you been treating your wound(s) until nowo santyl Has your wound(s) ever healed and then re-openedo No Have you had any lab work done in the past montho Yes Who ordered the lab work [facility] hospital Have you tested positive for an antibiotic resistant organism (MRSA, VRE)o No Have you tested positive for osteomyelitis (bone infection)o Yes Have you had any tests for circulation on your legso No Hematologic/Lymphatic Medical History: Positive for: Anemia Negative for: Hemophilia; Human Immunodeficiency Virus; Lymphedema; Sickle Cell Disease Respiratory Medical History: Negative for: Aspiration; Asthma; Chronic Obstructive Pulmonary Disease (COPD); Pneumothorax; Sleep Apnea; Tuberculosis Cardiovascular Medical History: Positive for: Congestive Heart Failure; Hypertension Negative for: Angina; Arrhythmia; Coronary Artery Disease; Deep Vein Thrombosis; Hypotension; Myocardial Infarction; Peripheral Arterial Disease; Peripheral Venous Disease Gastrointestinal Medical History: Negative for: Cirrhosis ; Colitis; Crohnos; Hepatitis A; Hepatitis B Past Medical History Notes: colostomy since 1990 Immunological Medical History: Negative for: Lupus Erythematosus; Raynaudos; Scleroderma Musculoskeletal Medical History: Positive for: Osteoarthritis; Osteomyelitis Brusseau, Adyson L. ([id number]) Negative for: Gout; Rheumatoid Arthritis Neurologic Medical History:  Negative for: Dementia; Neuropathy; Quadriplegia Oncologic Medical History: Positive for: Received Chemotherapy; Received Radiation Past Medical  History Notes: colorectal - 1990 Immunizations Pneumococcal Vaccine: Received Pneumococcal Vaccination: Yes Immunization Notes: up to date Implantable Devices Family and Social History Cancer: Yes - Child; Diabetes: No; Heart Disease: No; Hereditary Spherocytosis: No; Hypertension: No; Kidney Disease: No; Lung Disease: No; Seizures: No; Stroke: No; Thyroid Problems: No; Tuberculosis: No; Never smoker; Marital Status - Married; Alcohol Use: Never; Drug Use: No History; Caffeine Use: Daily; Financial Concerns: No; Food, Clothing or Shelter Needs: No; Support System Lacking: No; Transportation Concerns: No; Advanced Directives: Yes (Not Provided); Patient does not want information on Advanced Directives; Living Will: Yes (Not Provided); Medical Power of Attorney: Yes - son - Wakeelah Solan (Not Provided) Physician Affirmation I have reviewed and agree with the above information. Electronic Signature(s) Signed: 11/06/2017 4:19:51 PM By: Christin Fudge MD, FACS Signed: 11/06/2017 4:53:14 PM By: Montey Hora Entered By: Christin Fudge on 11/06/2017 13:05:39 [place], [place] L. ([id number]) -------------------------------------------------------------------------------- SuperBill Details Patient Name: [name], Aracelie L. Date of Service: 11/06/2017 Medical Record Number: [id number] Patient Account Number: [id number] Date of Birth/Sex: 1930/07/28 (83 y.o. Female) Treating RN: Montey Hora Primary Care Provider: Priscille Kluver Other Clinician: Referring Provider: Priscille Kluver Treating Provider/Extender: Frann Rider in Treatment: 4 Diagnosis Coding ICD-10 Codes Code Description L89.154 Pressure ulcer of sacral region, stage 4 E44.0 Moderate protein-calorie malnutrition Z99.3 Dependence on wheelchair Facility Procedures CPT4 Code: [id number] Description: [place] VISIT-LEV 3 EST PT Modifier: Quantity: 1 Physician Procedures CPT4 Code: 8828003 Description: 49179 - WC PHYS  LEVEL 3 - EST PT ICD-10 Diagnosis Description L89.154 Pressure ulcer of sacral region, stage 4 E44.0 Moderate protein-calorie malnutrition Z99.3 Dependence on wheelchair Modifier: Quantity: 1 Electronic Signature(s) Signed: 11/06/2017 2:29:19 PM By: Montey Hora Signed: 11/06/2017 4:19:51 PM By: Christin Fudge MD, FACS Previous Signature: 11/06/2017 1:07:10 PM Version By: Christin Fudge MD, FACS Entered By: Montey Hora on 11/06/2017 14:29:18

## 2017-11-08 NOTE — Progress Notes (Signed)
[name], [name] ([id number]) Visit Report for 11/06/2017 Arrival Information Details Patient Name: [name], [name] Date of Service: 11/06/2017 12:30 PM Medical Record Number: [id number] Patient Account Number: [id number] Date of Birth/Sex: 10/12/1930 (83 y.o. Female) Treating RN: Montey Hora Primary Care Merle Whitehorn: Priscille Kluver Other Clinician: Referring Dirck Butch: Priscille Kluver Treating Tranise Forrest/Extender: Frann Rider in Treatment: 4 Visit Information History Since Last Visit Added or deleted any medications: No Patient Arrived: Wheel Chair Any new allergies or adverse reactions: No Arrival Time: 12:43 Had a fall or experienced change in No Accompanied By: friend activities of daily living that may affect Transfer Assistance: Manual risk of falls: Patient Identification Verified: Yes Signs or symptoms of abuse/neglect since last visito No Secondary Verification Process Completed: Yes Hospitalized since last visit: No Has Dressing in Place as Prescribed: Yes Pain Present Now: No Electronic Signature(s) Signed: 11/06/2017 4:53:14 PM By: Montey Hora Entered By: Montey Hora on 11/06/2017 12:43:31 [place], [place]. ([id number]) -------------------------------------------------------------------------------- Clinic Level of Care Assessment Details Patient Name: [name], [name] Date of Service: 11/06/2017 12:30 PM Medical Record Number: [id number] Patient Account Number: [id number] Date of Birth/Sex: July 02, 1930 (83 y.o. Female) Treating RN: Montey Hora Primary Care Kristofer Schaffert: Priscille Kluver Other Clinician: Referring Cardale Dorer: Priscille Kluver Treating Cianni Manny/Extender: Frann Rider in Treatment: 4 Clinic Level of Care Assessment Items TOOL 4 Quantity Score []  - Use when only an EandM is performed on FOLLOW-UP visit 0 ASSESSMENTS - Nursing Assessment / Reassessment X - Reassessment of Co-morbidities (includes updates in patient status) 1 10 X- 1  5 Reassessment of Adherence to Treatment Plan ASSESSMENTS - Wound and Skin Assessment / Reassessment X - Simple Wound Assessment / Reassessment - one wound 1 5 []  - 0 Complex Wound Assessment / Reassessment - multiple wounds []  - 0 Dermatologic / Skin Assessment (not related to wound area) ASSESSMENTS - Focused Assessment []  - Circumferential Edema Measurements - multi extremities 0 []  - 0 Nutritional Assessment / Counseling / Intervention []  - 0 Lower Extremity Assessment (monofilament, tuning fork, pulses) []  - 0 Peripheral Arterial Disease Assessment (using hand held doppler) ASSESSMENTS - Ostomy and/or Continence Assessment and Care []  - Incontinence Assessment and Management 0 []  - 0 Ostomy Care Assessment and Management (repouching, etc.) PROCESS - Coordination of Care X - Simple Patient / Family Education for ongoing care 1 15 []  - 0 Complex (extensive) Patient / Family Education for ongoing care []  - 0 Staff obtains Programmer, systems, Records, Test Results / Process Orders []  - 0 Staff telephones HHA, Nursing Homes / Clarify orders / etc []  - 0 Routine Transfer to another Facility (non-emergent condition) []  - 0 Routine Hospital Admission (non-emergent condition) []  - 0 New Admissions / Biomedical engineer / Ordering NPWT, Apligraf, etc. []  - 0 Emergency Hospital Admission (emergent condition) X- 1 10 Simple Discharge Coordination Sitter, [name] ([id number]) []  - 0 Complex (extensive) Discharge Coordination PROCESS - Special Needs []  - Pediatric / Minor Patient Management 0 []  - 0 Isolation Patient Management []  - 0 Hearing / Language / Visual special needs []  - 0 Assessment of Community assistance (transportation, D/C planning, etc.) []  - 0 Additional assistance / Altered mentation []  - 0 Support Surface(s) Assessment (bed, cushion, seat, etc.) INTERVENTIONS - Wound Cleansing / Measurement X - Simple Wound Cleansing - one wound 1 5 []  - 0 Complex Wound  Cleansing - multiple wounds X- 1 5 Wound Imaging (photographs - any number of wounds) []  - 0 Wound Tracing (instead of photographs) X- 1 5 Simple Wound Measurement -  one wound []  - 0 Complex Wound Measurement - multiple wounds INTERVENTIONS - Wound Dressings X - Small Wound Dressing one or multiple wounds 1 10 []  - 0 Medium Wound Dressing one or multiple wounds []  - 0 Large Wound Dressing one or multiple wounds X- 1 5 Application of Medications - topical []  - 0 Application of Medications - injection INTERVENTIONS - Miscellaneous []  - External ear exam 0 []  - 0 Specimen Collection (cultures, biopsies, blood, body fluids, etc.) []  - 0 Specimen(s) / Culture(s) sent or taken to Lab for analysis []  - 0 Patient Transfer (multiple staff / Civil Service fast streamer / Similar devices) []  - 0 Simple Staple / Suture removal (25 or less) []  - 0 Complex Staple / Suture removal (26 or more) []  - 0 Hypo / Hyperglycemic Management (close monitor of Blood Glucose) []  - 0 Ankle / Brachial Index (ABI) - do not check if billed separately X- 1 5 Vital Signs [name], [name] ([id number]) Has the patient been seen at the hospital within the last three years: Yes Total Score: 80 Level Of Care: New/Established - Level 3 Electronic Signature(s) Signed: 11/06/2017 4:53:14 PM By: Montey Hora Entered By: Montey Hora on 11/06/2017 14:29:09 [name], [name] ([id number]) -------------------------------------------------------------------------------- Encounter Discharge Information Details Patient Name: [name] Hansen, [name] Date of Service: 11/06/2017 12:30 PM Medical Record Number: [id number] Patient Account Number: [id number] Date of Birth/Sex: 01/20/1930 (83 y.o. Female) Treating RN: Montey Hora Primary Care Orvie Caradine: Priscille Kluver Other Clinician: Referring Saharah Sherrow: Priscille Kluver Treating Deane Wattenbarger/Extender: Frann Rider in Treatment: 4 Encounter Discharge Information Items Discharge Pain  Level: 0 Discharge Condition: Stable Ambulatory Status: Wheelchair Discharge Destination: Home Transportation: Private Auto Accompanied By: friend Schedule Follow-up Appointment: Yes Medication Reconciliation completed and No provided to Patient/Care Corby Villasenor: Provided on Clinical Summary of Care: 11/06/2017 Form Type Recipient Paper Patient RA Electronic Signature(s) Signed: 11/06/2017 2:30:06 PM By: Montey Hora Entered By: Montey Hora on 11/06/2017 14:30:06 Ladouceur, Tama ([id number]) -------------------------------------------------------------------------------- Multi Wound Chart Details Patient Name: [name] Hansen, [name] Date of Service: 11/06/2017 12:30 PM Medical Record Number: [id number] Patient Account Number: [id number] Date of Birth/Sex: Apr 11, 1930 (83 y.o. Female) Treating RN: Montey Hora Primary Care Lashea Goda: Priscille Kluver Other Clinician: Referring Abelina Ketron: Priscille Kluver Treating Zyaira Vejar/Extender: Frann Rider in Treatment: 4 Vital Signs Height(in): 64 Pulse(bpm): 43 Weight(lbs): 200 Blood Pressure(mmHg): 196/62 Body Mass Index(BMI): 34 Temperature(F): Respiratory Rate 16 (breaths/min): Photos: [1:No Photos] [N/A:N/A] Wound Location: [1:Sacrum] [N/A:N/A] Wounding Event: [1:Pressure Injury] [N/A:N/A] Primary Etiology: [1:Pressure Ulcer] [N/A:N/A] Comorbid History: [1:Anemia, Congestive Heart Failure, Hypertension, Osteoarthritis, Osteomyelitis, Received Chemotherapy, Received Radiation] [N/A:N/A] Date Acquired: [1:07/14/2017] [N/A:N/A] Weeks of Treatment: [1:4] [N/A:N/A] Wound Status: [1:Open] [N/A:N/A] Measurements L x W x D [1:1.2x0.6x1.5] [N/A:N/A] (cm) Area (cm) : [1:0.565] [N/A:N/A] Volume (cm) : [1:0.848] [N/A:N/A] % Reduction in Area: [1:50.00%] [N/A:N/A] % Reduction in Volume: [1:42.30%] [N/A:N/A] Starting Position 1 [1:12] (o'clock): Ending Position 1 [1:12] (o'clock): Maximum Distance 1 (cm): [1:1.8] Undermining:  [1:Yes] [N/A:N/A] Classification: [1:Category/Stage IV] [N/A:N/A] Exudate Amount: [1:Large] [N/A:N/A] Exudate Type: [1:Serous] [N/A:N/A] Exudate Color: [1:amber] [N/A:N/A] Wound Margin: [1:Flat and Intact] [N/A:N/A] Granulation Amount: [1:Large (67-100%)] [N/A:N/A] Granulation Quality: [1:Red] [N/A:N/A] Necrotic Amount: [1:Small (1-33%)] [N/A:N/A] Necrotic Tissue: [1:Eschar, Adherent Slough] [N/A:N/A] Exposed Structures: [1:Fascia: Yes Fat Layer (Subcutaneous Tissue) Exposed: Yes Tendon: Yes] [N/A:N/A] Muscle: No Joint: No Bone: No Epithelialization: None N/A N/A Periwound Skin Texture: Scarring: Yes N/A N/A Excoriation: No Induration: No Callus: No Crepitus: No Rash: No Periwound Skin Moisture: Maceration: No N/A N/A Dry/Scaly: No Periwound Skin Color: Atrophie Blanche: No  N/A N/A Cyanosis: No Ecchymosis: No Erythema: No Hemosiderin Staining: No Mottled: No Pallor: No Rubor: No Temperature: No Abnormality N/A N/A Tenderness on Palpation: No N/A N/A Wound Preparation: Ulcer Cleansing: N/A N/A Rinsed/Irrigated with Saline Topical Anesthetic Applied: Other: lidocaine 4% Treatment Notes Electronic Signature(s) Signed: 11/06/2017 1:04:46 PM By: Christin Fudge MD, FACS Entered By: Christin Fudge on 11/06/2017 13:04:46 Falero, Rutherford L. ([id number]) -------------------------------------------------------------------------------- [place] Details Patient Name: [name] Hansen, [name] Date of Service: 11/06/2017 12:30 PM Medical Record Number: [id number] Patient Account Number: [id number] Date of Birth/Sex: 26-Dec-1929 (83 y.o. Female) Treating RN: Montey Hora Primary Care Zohar Maroney: Priscille Kluver Other Clinician: Referring Quandra Fedorchak: Priscille Kluver Treating Mikiah Durall/Extender: Frann Rider in Treatment: 4 Active Inactive ` Abuse / Safety / Falls / Self Care Management Nursing Diagnoses: Potential for falls Goals: Patient will not experience any  injury related to falls Date Initiated: 10/09/2017 Target Resolution Date: 01/10/2018 Goal Status: Active Interventions: Assess fall risk on admission and as needed Notes: ` Orientation to the Wound Care Program Nursing Diagnoses: Knowledge deficit related to the wound healing center program Goals: Patient/caregiver will verbalize understanding of the [facility] Program Date Initiated: 10/09/2017 Target Resolution Date: 01/10/2018 Goal Status: Active Interventions: Provide education on orientation to the wound center Notes: ` Pressure Nursing Diagnoses: Potential for impaired tissue integrity related to pressure, friction, moisture, and shear Goals: Patient will remain free from development of additional pressure ulcers Date Initiated: 10/09/2017 Target Resolution Date: 01/10/2018 Goal Status: Active Interventions: Elleah, Hemsley [name] ([id number]) Assess offloading mechanisms upon admission and as needed Provide education on pressure ulcers Notes: ` Wound/Skin Impairment Nursing Diagnoses: Impaired tissue integrity Goals: Ulcer/skin breakdown will heal within 14 weeks Date Initiated: 10/09/2017 Target Resolution Date: 01/10/2018 Goal Status: Active Interventions: Assess patient/caregiver ability to obtain necessary supplies Assess patient/caregiver ability to perform ulcer/skin care regimen upon admission and as needed Assess ulceration(s) every visit Notes: Electronic Signature(s) Signed: 11/06/2017 4:53:14 PM By: Montey Hora Entered By: Montey Hora on 11/06/2017 12:59:24 Ulloa, [name] ([id number]) -------------------------------------------------------------------------------- Pain Assessment Details Patient Name: [name] Hansen, [name] Date of Service: 11/06/2017 12:30 PM Medical Record Number: [id number] Patient Account Number: [id number] Date of Birth/Sex: 1930-10-13 (83 y.o. Female) Treating RN: Montey Hora Primary Care Tagan Bartram: Priscille Kluver Other  Clinician: Referring Phoenyx Melka: Priscille Kluver Treating Gurbani Figge/Extender: Frann Rider in Treatment: 4 Active Problems Location of Pain Severity and Description of Pain Patient Has Paino No Site Locations Pain Management and Medication Current Pain Management: Electronic Signature(s) Signed: 11/06/2017 4:53:14 PM By: Montey Hora Entered By: Montey Hora on 11/06/2017 12:43:43 Anon Raices, [name] ([id number]) -------------------------------------------------------------------------------- Patient/Caregiver Education Details Patient Name: [name] Hansen, [name] Date of Service: 11/06/2017 12:30 PM Medical Record Number: [id number] Patient Account Number: [id number] Date of Birth/Gender: 1930-06-24 (83 y.o. Female) Treating RN: Montey Hora Primary Care Physician: Priscille Kluver Other Clinician: Referring Physician: Priscille Kluver Treating Physician/Extender: Frann Rider in Treatment: 4 Education Assessment Education Provided To: Patient and Caregiver Education Topics Provided Wound/Skin Impairment: Handouts: Other: wound care to continue as ordered Methods: Demonstration, Explain/Verbal Responses: State content correctly Electronic Signature(s) Signed: 11/06/2017 4:53:14 PM By: Montey Hora Entered By: Montey Hora on 11/06/2017 14:30:39 Givhan, [name] ([id number]) -------------------------------------------------------------------------------- Wound Assessment Details Patient Name: Louro, [name] Date of Service: 11/06/2017 12:30 PM Medical Record Number: [id number] Patient Account Number: [id number] Date of Birth/Sex: 02/03/1930 (83 y.o. Female) Treating RN: Montey Hora Primary Care Latorria Zeoli: Priscille Kluver Other Clinician: Referring Cavon Nicolls: Priscille Kluver Treating Shirrell Solinger/Extender: Frann Rider in Treatment: 4 Wound Status Wound  Number: 1 Primary Pressure Ulcer Etiology: Wound Location: Sacrum Wound Open Wounding Event: Pressure  Injury Status: Date Acquired: 07/14/2017 Comorbid Anemia, Congestive Heart Failure, Weeks Of Treatment: 4 History: Hypertension, Osteoarthritis, Osteomyelitis, Clustered Wound: No Received Chemotherapy, Received Radiation Photos Photo Uploaded By: Montey Hora on 11/06/2017 15:59:09 Wound Measurements Length: (cm) 1.2 Width: (cm) 0.6 Depth: (cm) 1.5 Area: (cm) 0.565 Volume: (cm) 0.848 % Reduction in Area: 50% % Reduction in Volume: 42.3% Epithelialization: None Tunneling: No Undermining: Yes Starting Position (o'clock): 12 Ending Position (o'clock): 12 Maximum Distance: (cm) 1.8 Wound Description Classification: Category/Stage IV Wound Margin: Flat and Intact Exudate Amount: Large Exudate Type: Serous Exudate Color: amber Foul Odor After Cleansing: No Slough/Fibrino Yes Wound Bed Granulation Amount: Large (67-100%) Exposed Structure Granulation Quality: Red Fascia Exposed: Yes Necrotic Amount: Small (1-33%) Fat Layer (Subcutaneous Tissue) Exposed: Yes Necrotic Quality: Eschar, Adherent Slough Tendon Exposed: Yes Muscle Exposed: No Haithcock, [name] ([id number]) Joint Exposed: No Bone Exposed: No Periwound Skin Texture Texture Color No Abnormalities Noted: No No Abnormalities Noted: No Callus: No Atrophie Blanche: No Crepitus: No Cyanosis: No Excoriation: No Ecchymosis: No Induration: No Erythema: No Rash: No Hemosiderin Staining: No Scarring: Yes Mottled: No Pallor: No Moisture Rubor: No No Abnormalities Noted: No Dry / Scaly: No Temperature / Pain Maceration: No Temperature: No Abnormality Wound Preparation Ulcer Cleansing: Rinsed/Irrigated with Saline Topical Anesthetic Applied: Other: lidocaine 4%, Treatment Notes Wound #1 (Sacrum) 1. Cleansed with: Clean wound with Normal Saline 2. Anesthetic Topical Lidocaine 4% cream to wound bed prior to debridement 4. Dressing Applied: Santyl Ointment 5. Secondary Dressing Applied Bordered  Foam Dressing Dry Gauze Electronic Signature(s) Signed: 11/06/2017 4:53:14 PM By: Montey Hora Entered By: Montey Hora on 11/06/2017 12:53:03 Shakespeare, [name] ([id number]) -------------------------------------------------------------------------------- Vitals Details Patient Name: [name] Hansen, Taetum L. Date of Service: 11/06/2017 12:30 PM Medical Record Number: [id number] Patient Account Number: [id number] Date of Birth/Sex: August 11, 1930 (83 y.o. Female) Treating RN: Montey Hora Primary Care Levone Otten: Priscille Kluver Other Clinician: Referring Hillari Zumwalt: Priscille Kluver Treating Labrian Torregrossa/Extender: Frann Rider in Treatment: 4 Vital Signs Time Taken: 12:43 Pulse (bpm): 67 Height (in): 64 Respiratory Rate (breaths/min): 16 Weight (lbs): 200 Blood Pressure (mmHg): 196/62 Body Mass Index (BMI): 34.3 Reference Range: 80 - 120 mg / dl Electronic Signature(s) Signed: 11/06/2017 4:53:14 PM By: Montey Hora Entered By: Montey Hora on 11/06/2017 12:45:17

## 2017-11-13 ENCOUNTER — Encounter: Payer: Medicare Other | Admitting: Surgery

## 2017-11-13 DIAGNOSIS — L89154 Pressure ulcer of sacral region, stage 4: Secondary | ICD-10-CM | POA: Diagnosis not present

## 2017-11-15 NOTE — Progress Notes (Signed)
[name], [name] ([id number]) Visit Report for 11/13/2017 Arrival Information Details Patient Name: [name], [name] Date of Service: 11/13/2017 1:30 PM Medical Record Number: [id number] Patient Account Number: [id number] Date of Birth/Sex: [birth year] (83 y.o. Female) Treating RN: Montey Hora Primary Care Melvina Pangelinan: Priscille Kluver Other Clinician: Referring Amaris Garrette: Priscille Kluver Treating Kiyon Fidalgo/Extender: Frann Rider in Treatment: 5 Visit Information History Since Last Visit Added or deleted any medications: No Patient Arrived: Wheel Chair Any new allergies or adverse reactions: No Arrival Time: 13:36 Had a fall or experienced change in No Accompanied By: friend activities of daily living that may affect Transfer Assistance: Manual risk of falls: Patient Identification Verified: Yes Signs or symptoms of abuse/neglect since last visito No Secondary Verification Process Completed: Yes Hospitalized since last visit: No Has Dressing in Place as Prescribed: Yes Pain Present Now: No Electronic Signature(s) Signed: 11/13/2017 4:52:42 PM By: Montey Hora Entered By: Montey Hora on 11/13/2017 13:37:11 [name], [name] [place]. ([id number]) -------------------------------------------------------------------------------- Clinic Level of Care Assessment Details Patient Name: [name], [name] Date of Service: 11/13/2017 1:30 PM Medical Record Number: [id number] Patient Account Number: [id number] Date of Birth/Sex: 02-05-1930 (83 y.o. Female) Treating RN: Montey Hora Primary Care Dalayza Zambrana: Priscille Kluver Other Clinician: Referring Severn Goddard: Priscille Kluver Treating Ovie Eastep/Extender: Frann Rider in Treatment: 5 Clinic Level of Care Assessment Items TOOL 4 Quantity Score []  - Use when only an EandM is performed on FOLLOW-UP visit 0 ASSESSMENTS - Nursing Assessment / Reassessment X - Reassessment of Co-morbidities (includes updates in patient status) 1 10 X- 1  5 Reassessment of Adherence to Treatment Plan ASSESSMENTS - Wound and Skin Assessment / Reassessment X - Simple Wound Assessment / Reassessment - one wound 1 5 []  - 0 Complex Wound Assessment / Reassessment - multiple wounds []  - 0 Dermatologic / Skin Assessment (not related to wound area) ASSESSMENTS - Focused Assessment []  - Circumferential Edema Measurements - multi extremities 0 []  - 0 Nutritional Assessment / Counseling / Intervention []  - 0 Lower Extremity Assessment (monofilament, tuning fork, pulses) []  - 0 Peripheral Arterial Disease Assessment (using hand held doppler) ASSESSMENTS - Ostomy and/or Continence Assessment and Care []  - Incontinence Assessment and Management 0 []  - 0 Ostomy Care Assessment and Management (repouching, etc.) PROCESS - Coordination of Care X - Simple Patient / Family Education for ongoing care 1 15 []  - 0 Complex (extensive) Patient / Family Education for ongoing care []  - 0 Staff obtains Programmer, systems, Records, Test Results / Process Orders []  - 0 Staff telephones HHA, Nursing Homes / Clarify orders / etc []  - 0 Routine Transfer to another Facility (non-emergent condition) []  - 0 Routine Hospital Admission (non-emergent condition) []  - 0 New Admissions / Biomedical engineer / Ordering NPWT, Apligraf, etc. []  - 0 Emergency Hospital Admission (emergent condition) X- 1 10 Simple Discharge Coordination [name], [name] ([id number]) []  - 0 Complex (extensive) Discharge Coordination PROCESS - Special Needs []  - Pediatric / Minor Patient Management 0 []  - 0 Isolation Patient Management []  - 0 Hearing / Language / Visual special needs []  - 0 Assessment of Community assistance (transportation, D/C planning, etc.) []  - 0 Additional assistance / Altered mentation []  - 0 Support Surface(s) Assessment (bed, cushion, seat, etc.) INTERVENTIONS - Wound Cleansing / Measurement X - Simple Wound Cleansing - one wound 1 5 []  - 0 Complex Wound  Cleansing - multiple wounds X- 1 5 Wound Imaging (photographs - any number of wounds) []  - 0 Wound Tracing (instead of photographs) X- 1 5 Simple Wound Measurement -  one wound []  - 0 Complex Wound Measurement - multiple wounds INTERVENTIONS - Wound Dressings X - Small Wound Dressing one or multiple wounds 1 10 []  - 0 Medium Wound Dressing one or multiple wounds []  - 0 Large Wound Dressing one or multiple wounds X- 1 5 Application of Medications - topical []  - 0 Application of Medications - injection INTERVENTIONS - Miscellaneous []  - External ear exam 0 []  - 0 Specimen Collection (cultures, biopsies, blood, body fluids, etc.) []  - 0 Specimen(s) / Culture(s) sent or taken to Lab for analysis X- 1 10 Patient Transfer (multiple staff / Harrel Lemon Lift / Similar devices) []  - 0 Simple Staple / Suture removal (25 or less) []  - 0 Complex Staple / Suture removal (26 or more) []  - 0 Hypo / Hyperglycemic Management (close monitor of Blood Glucose) []  - 0 Ankle / Brachial Index (ABI) - do not check if billed separately X- 1 5 Vital Signs [name], [place]. ([id number]) Has the patient been seen at the hospital within the last three years: Yes Total Score: 90 Level Of Care: New/Established - Level 3 Electronic Signature(s) Signed: 11/13/2017 4:52:42 PM By: Montey Hora Entered By: Montey Hora on 11/13/2017 15:39:27 [name], [name] ([id number]) -------------------------------------------------------------------------------- Encounter Discharge Information Details Patient Name: [name], [name] Date of Service: 11/13/2017 1:30 PM Medical Record Number: [id number] Patient Account Number: [id number] Date of Birth/Sex: 1930-04-15 (83 y.o. Female) Treating RN: Montey Hora Primary Care Dnyla Antonetti: Priscille Kluver Other Clinician: Referring Nessa Ramaker: Priscille Kluver Treating Kila Godina/Extender: Frann Rider in Treatment: 5 Encounter Discharge Information Items Discharge Pain  Level: 0 Discharge Condition: Stable Ambulatory Status: Wheelchair Discharge Destination: Home Transportation: Private Auto Accompanied By: caregiver Schedule Follow-up Appointment: Yes Medication Reconciliation completed and No provided to Patient/Care Arav Bannister: Provided on Clinical Summary of Care: 11/13/2017 Form Type Recipient Paper Patient RA Electronic Signature(s) Signed: 11/13/2017 4:52:42 PM By: Montey Hora Entered By: Montey Hora on 11/13/2017 15:40:42 [name], [name] ([id number]) -------------------------------------------------------------------------------- Multi Wound Chart Details Patient Name: [name], [name] Date of Service: 11/13/2017 1:30 PM Medical Record Number: [id number] Patient Account Number: [id number] Date of Birth/Sex: 1930-01-22 (83 y.o. Female) Treating RN: Montey Hora Primary Care Kal Chait: Priscille Kluver Other Clinician: Referring Georgean Spainhower: Priscille Kluver Treating Tanayah Squitieri/Extender: Frann Rider in Treatment: 5 Vital Signs Height(in): 64 Pulse(bpm): 35 Weight(lbs): 200 Blood Pressure(mmHg): 210/75 Body Mass Index(BMI): 34 Temperature(F): 97.9 Respiratory Rate 18 (breaths/min): Photos: [1:No Photos] [N/A:N/A] Wound Location: [1:Sacrum] [N/A:N/A] Wounding Event: [1:Pressure Injury] [N/A:N/A] Primary Etiology: [1:Pressure Ulcer] [N/A:N/A] Comorbid History: [1:Anemia, Congestive Heart Failure, Hypertension, Osteoarthritis, Osteomyelitis, Received Chemotherapy, Received Radiation] [N/A:N/A] Date Acquired: [1:07/14/2017] [N/A:N/A] Weeks of Treatment: [1:5] [N/A:N/A] Wound Status: [1:Open] [N/A:N/A] Measurements L x W x D [1:1x0.6x1.5] [N/A:N/A] (cm) Area (cm) : [1:0.471] [N/A:N/A] Volume (cm) : [1:0.707] [N/A:N/A] % Reduction in Area: [1:58.40%] [N/A:N/A] % Reduction in Volume: [1:51.90%] [N/A:N/A] Starting Position 1 [1:12] (o'clock): Ending Position 1 [1:12] (o'clock): Maximum Distance 1 (cm):  [1:1.7] Undermining: [1:Yes] [N/A:N/A] Classification: [1:Category/Stage IV] [N/A:N/A] Exudate Amount: [1:Large] [N/A:N/A] Exudate Type: [1:Serous] [N/A:N/A] Exudate Color: [1:amber] [N/A:N/A] Wound Margin: [1:Flat and Intact] [N/A:N/A] Granulation Amount: [1:Large (67-100%)] [N/A:N/A] Granulation Quality: [1:Red] [N/A:N/A] Necrotic Amount: [1:Small (1-33%)] [N/A:N/A] Necrotic Tissue: [1:Eschar, Adherent Slough] [N/A:N/A] Exposed Structures: [1:Fascia: Yes Fat Layer (Subcutaneous Tissue) Exposed: Yes Tendon: Yes] [N/A:N/A] Muscle: No Joint: No Bone: No Epithelialization: None N/A N/A Periwound Skin Texture: Scarring: Yes N/A N/A Excoriation: No Induration: No Callus: No Crepitus: No Rash: No Periwound Skin Moisture: Maceration: No N/A N/A Dry/Scaly: No Periwound Skin Color: Atrophie Blanche:  No N/A N/A Cyanosis: No Ecchymosis: No Erythema: No Hemosiderin Staining: No Mottled: No Pallor: No Rubor: No Temperature: No Abnormality N/A N/A Tenderness on Palpation: No N/A N/A Wound Preparation: Ulcer Cleansing: N/A N/A Rinsed/Irrigated with Saline Topical Anesthetic Applied: Other: lidocaine 4% Treatment Notes Electronic Signature(s) Signed: 11/13/2017 2:16:38 PM By: [name], [name] Entered By: [name] Fudge on 11/13/2017 [place], [place] LMarland Kitchen ([id number]) -------------------------------------------------------------------------------- [place] Details Patient Name: [name], [name] Date of Service: 11/13/2017 1:30 PM Medical Record Number: [id number] Patient Account Number: [id number] Date of Birth/Sex: [birth year] (83 y.o. Female) Treating RN: Montey Hora Primary Care Aerin Delany: Priscille Kluver Other Clinician: Referring Lex Linhares: Priscille Kluver Treating Aijalon Kirtz/Extender: Frann Rider in Treatment: 5 Active Inactive ` Abuse / Safety / Falls / Self Care Management Nursing Diagnoses: Potential for falls Goals: Patient will  not experience any injury related to falls Date Initiated: 10/09/2017 Target Resolution Date: 01/10/2018 Goal Status: Active Interventions: Assess fall risk on admission and as needed Notes: ` Orientation to the Wound Care Program Nursing Diagnoses: Knowledge deficit related to the wound healing center program Goals: Patient/caregiver will verbalize understanding of the [facility] Date Initiated: 10/09/2017 Target Resolution Date: 01/10/2018 Goal Status: Active Interventions: Provide education on orientation to the wound center Notes: ` Pressure Nursing Diagnoses: Potential for impaired tissue integrity related to pressure, friction, moisture, and shear Goals: Patient will remain free from development of additional pressure ulcers Date Initiated: 10/09/2017 Target Resolution Date: 01/10/2018 Goal Status: Active Interventions: Chi, [place] Amandajo L. ([id number]) Assess offloading mechanisms upon admission and as needed Provide education on pressure ulcers Notes: ` Wound/Skin Impairment Nursing Diagnoses: Impaired tissue integrity Goals: Ulcer/skin breakdown will heal within 14 weeks Date Initiated: 10/09/2017 Target Resolution Date: 01/10/2018 Goal Status: Active Interventions: Assess patient/caregiver ability to obtain necessary supplies Assess patient/caregiver ability to perform ulcer/skin care regimen upon admission and as needed Assess ulceration(s) every visit Notes: Electronic Signature(s) Signed: 11/13/2017 4:52:42 PM By: Montey Hora Entered By: Montey Hora on 11/13/2017 13:52:29 Gutzmer, Kemoni L. ([id number]) -------------------------------------------------------------------------------- Pain Assessment Details Patient Name: [name], Dinara L. Date of Service: 11/13/2017 1:30 PM Medical Record Number: [id number] Patient Account Number: [id number] Date of Birth/Sex: [birth year] (83 y.o. Female) Treating RN: Montey Hora Primary Care Naiyana Barbian:  Priscille Kluver Other Clinician: Referring [place] Duquette: Priscille Kluver Treating Hailei Besser/Extender: Frann Rider in Treatment: 5 Active Problems Location of Pain Severity and Description of Pain Patient Has Paino No Site Locations Pain Management and Medication Current Pain Management: Electronic Signature(s) Signed: 11/13/2017 4:52:42 PM By: Montey Hora Entered By: Montey Hora on 11/13/2017 13:37:23 Saadeh, Sola L. ([id number]) -------------------------------------------------------------------------------- Patient/Caregiver Education Details Patient Name: [name], Maddilynn L. Date of Service: 11/13/2017 1:30 PM Medical Record Number: [id number] Patient Account Number: [id number] Date of Birth/Gender: 11-01-1930 (83 y.o. Female) Treating RN: Montey Hora Primary Care Physician: Priscille Kluver Other Clinician: Referring Physician: Priscille Kluver Treating Physician/Extender: Frann Rider in Treatment: 5 Education Assessment Education Provided To: Patient and Caregiver Education Topics Provided Wound/Skin Impairment: Handouts: Other: wound care as ordered Methods: Demonstration, Explain/Verbal Responses: State content correctly Electronic Signature(s) Signed: 11/13/2017 4:52:42 PM By: Montey Hora Entered By: Montey Hora on 11/13/2017 [place], Bayonne L. ([id number]) -------------------------------------------------------------------------------- Wound Assessment Details Patient Name: Buttery, Viva L. Date of Service: 11/13/2017 1:30 PM Medical Record Number: [id number] Patient Account Number: [id number] Date of Birth/Sex: [birth year] (83 y.o. Female) Treating RN: Montey Hora Primary Care Laelah Siravo: Priscille Kluver Other Clinician: Referring Violet Cart: Priscille Kluver Treating Shivangi Lutz/Extender: Frann Rider in Treatment: 5 Wound Status Wound Number:  1 Primary Pressure Ulcer Etiology: Wound Location: Sacrum Wound Open Wounding Event: Pressure  Injury Status: Date Acquired: 07/14/2017 Comorbid Anemia, Congestive Heart Failure, Weeks Of Treatment: 5 History: Hypertension, Osteoarthritis, Osteomyelitis, Clustered Wound: No Received Chemotherapy, Received Radiation Photos Photo Uploaded By: Montey Hora on 11/14/2017 07:59:04 Wound Measurements Length: (cm) 1 Width: (cm) 0.6 Depth: (cm) 1.5 Area: (cm) 0.471 Volume: (cm) 0.707 % Reduction in Area: 58.4% % Reduction in Volume: 51.9% Epithelialization: None Tunneling: No Undermining: Yes Starting Position (o'clock): 12 Ending Position (o'clock): 12 Maximum Distance: (cm) 1.7 Wound Description Classification: Category/Stage IV Wound Margin: Flat and Intact Exudate Amount: Large Exudate Type: Serous Exudate Color: amber Foul Odor After Cleansing: No Slough/Fibrino Yes Wound Bed Granulation Amount: Large (67-100%) Exposed Structure Granulation Quality: Red Fascia Exposed: Yes Necrotic Amount: Small (1-33%) Fat Layer (Subcutaneous Tissue) Exposed: Yes Necrotic Quality: Eschar, Adherent Slough Tendon Exposed: Yes Muscle Exposed: No Reach, Vandella L. ([id number]) Joint Exposed: No Bone Exposed: No Periwound Skin Texture Texture Color No Abnormalities Noted: No No Abnormalities Noted: No Callus: No Atrophie Blanche: No Crepitus: No Cyanosis: No Excoriation: No Ecchymosis: No Induration: No Erythema: No Rash: No Hemosiderin Staining: No Scarring: Yes Mottled: No Pallor: No Moisture Rubor: No No Abnormalities Noted: No Dry / Scaly: No Temperature / Pain Maceration: No Temperature: No Abnormality Wound Preparation Ulcer Cleansing: Rinsed/Irrigated with Saline Topical Anesthetic Applied: Other: lidocaine 4%, Treatment Notes Wound #1 (Sacrum) 1. Cleansed with: Clean wound with Normal Saline 2. Anesthetic Topical Lidocaine 4% cream to wound bed prior to debridement 4. Dressing Applied: Santyl Ointment 5. Secondary Dressing Applied Bordered  Foam Dressing Dry Gauze Electronic Signature(s) Signed: 11/13/2017 4:52:42 PM By: Montey Hora Entered By: Montey Hora on 11/13/2017 13:49:38 Garfield, Ragan ([id number]) -------------------------------------------------------------------------------- Vitals Details Patient Name: [name], Omari L. Date of Service: 11/13/2017 1:30 PM Medical Record Number: [id number] Patient Account Number: [id number] Date of Birth/Sex: 18-Apr-1930 (83 y.o. Female) Treating RN: Montey Hora Primary Care Tacara Hadlock: Priscille Kluver Other Clinician: Referring Demarrion Meiklejohn: Priscille Kluver Treating Adhvik Canady/Extender: Frann Rider in Treatment: 5 Vital Signs Time Taken: 13:37 Temperature (F): 97.9 Height (in): 64 Pulse (bpm): 78 Weight (lbs): 200 Respiratory Rate (breaths/min): 18 Body Mass Index (BMI): 34.3 Blood Pressure (mmHg): 210/75 Reference Range: 80 - 120 mg / dl Electronic Signature(s) Signed: 11/13/2017 4:52:42 PM By: Montey Hora Entered By: Montey Hora on 11/13/2017 13:40:57

## 2017-11-15 NOTE — Progress Notes (Signed)
CARMA, DWIGGINS ([id number]) Visit Report for 11/13/2017 Chief Complaint Document Details Patient Name: [name], [name] Date of Service: 11/13/2017 1:30 PM Medical Record Number: [id number] Patient Account Number: [id number] Date of Birth/Sex: 06-03-1930 (83 y.o. Female) Treating RN: Montey Hora Primary Care Provider: Priscille Kluver Other Clinician: Referring Provider: Priscille Kluver Treating Provider/Extender: Frann Rider in Treatment: 5 Information Obtained from: Patient Chief Complaint Patient is at the clinic for treatment of an open pressure ulcer to the sacral region which she's had since over 3 months Electronic Signature(s) Signed: 11/13/2017 2:16:44 PM By: Christin Fudge MD, FACS Entered By: Christin Fudge on 11/13/2017 14:16:44 O'Brien, [place]. ([id number]) -------------------------------------------------------------------------------- HPI Details Patient Name: [name], [name] Date of Service: 11/13/2017 1:30 PM Medical Record Number: [id number] Patient Account Number: [id number] Date of Birth/Sex: [birth year] (83 y.o. Female) Treating RN: Montey Hora Primary Care Provider: Priscille Kluver Other Clinician: Referring Provider: Priscille Kluver Treating Provider/Extender: Frann Rider in Treatment: 5 History of Present Illness Location: sacral pressure ulcer Quality: Patient reports No Pain. Severity: Patient states wound (s) are getting better. Duration: Patient has had the wound for > 3 months prior to seeking treatment at the wound center Context: The wound would happen gradually Modifying Factors: Other treatment(s) tried include:surgical debridement, IV antibiotics and occupational therapy and physical therapy Associated Signs and Symptoms: Patient reports having:appropriate treatments and home health HPI Description: 83 year old patient who has been referred to [place] for the sacral decubitus ulcer was admitted to the [facility] on 07/18/2017 for  an acute sacral decubitus with osteomyelitis. She was treated there with IV antibiotics and surgical debridement was done on 08/06/2017 and transferred back to a swing bed 08/12/2017. She has completed antibiotics on 09/18/2017 and has been now living at home. Past medical history of GERD, fracture of right humerus, hypertension, rectal cancer, status post colostomy, laparoscopic cholecystectomy, debridement of sacral decubitus ulcer including subcutaneous tissue muscle and bone done on 08/06/2017, previous rectal cancer surgery in April 2017. Reviewing notes from the [facility] it was noted that she was treated for a MRSA sacral osteomyelitis and Proteus bacteremia requiring IV antibiotics which included clindamycin and vancomycin. Vancomycin had to be stopped on September 25 due to neutropenia and thrombocytopenia and changed to daptomycin until 09/20/2017. Infectious disease then advised to switch to clindamycin and the PICC line was removed on 09/22/2017. review of the electronic medical records noted that cultures of bone and tissue taken from the OR for wound debridement on 95 grew MRSA and she was started on IV antibiotics initially clindamycin and switched to IV vancomycin and the Proteus bacteremia was stained treated with by mouth levofloxacin and Flagyl for 6 weeks to end on 09/03/2017. She was also advised an offloading mattress and would follow-up with supportive care 10/17/2017 -- the patient is ambulating a bit with the help of OT and PT and she is also eating much better. She has got inappropriate air mattress in place. 11/06/2017 -- the patient is a bit more ambivalent and continues to work with her mobilization. She is not yet ready for a wound VAC. Electronic Signature(s) Signed: 11/13/2017 2:16:48 PM By: Christin Fudge MD, FACS Entered By: Christin Fudge on 11/13/2017 14:16:48 Binegar, [place] L.  ([id number]) -------------------------------------------------------------------------------- Physical Exam Details Patient Name: [name], [name] Date of Service: 11/13/2017 1:30 PM Medical Record Number: [id number] Patient Account Number: [id number] Date of Birth/Sex: June 16, 1930 (83 y.o. Female) Treating RN: Montey Hora Primary Care Provider: Priscille Kluver Other Clinician: Referring Provider: Priscille Kluver Treating Provider/Extender: [name],  Mozella Rexrode Weeks in Treatment: 5 Constitutional . Pulse regular. Respirations normal and unlabored. Afebrile. . Eyes Nonicteric. Reactive to light. Ears, Nose, Mouth, and Throat Lips, teeth, and gums WNL.Marland Kitchen Moist mucosa without lesions. Neck supple and nontender. No palpable supraclavicular or cervical adenopathy. Normal sized without goiter. Respiratory WNL. No retractions.. Cardiovascular Pedal Pulses WNL. No clubbing, cyanosis or edema. Lymphatic No adneopathy. No adenopathy. No adenopathy. Musculoskeletal Adexa without tenderness or enlargement.. Digits and nails w/o clubbing, cyanosis, infection, petechiae, ischemia, or inflammatory conditions.. Integumentary (Hair, Skin) No suspicious lesions. No crepitus or fluctuance. No peri-wound warmth or erythema. No masses.Marland Kitchen Psychiatric Judgement and insight Intact.. No evidence of depression, anxiety, or agitation.. Notes the patient's sacral decubitus wound continues to have good granulation tissue and there is very minimal slough at the inferior portion. There is no bone palpable. Electronic Signature(s) Signed: 11/13/2017 2:17:39 PM By: Christin Fudge MD, FACS Entered By: Christin Fudge on 11/13/2017 14:17:38 Leavy, [place] LMarland Kitchen ([id number]) -------------------------------------------------------------------------------- Physician Orders Details Patient Name: [name], [name] Date of Service: 11/13/2017 1:30 PM Medical Record Number: [id number] Patient Account Number: [id number] Date of  Birth/Sex: 10-Feb-1930 (83 y.o. Female) Treating RN: Montey Hora Primary Care Provider: Priscille Kluver Other Clinician: Referring Provider: Priscille Kluver Treating Provider/Extender: Frann Rider in Treatment: 5 Verbal / Phone Orders: No Diagnosis Coding Wound Cleansing Wound #1 Sacrum o Clean wound with Normal Saline. o Cleanse wound with mild soap and water o May Shower, gently pat wound dry prior to applying new dressing. Skin Barriers/Peri-Wound Care Wound #1 Sacrum o Skin Prep Primary Wound Dressing Wound #1 Sacrum o Santyl Ointment Secondary Dressing Wound #1 Sacrum o Dry Gauze - pack lightly with gauze o Boardered Foam Dressing Dressing Change Frequency Wound #1 Sacrum o Change dressing every day. Follow-up Appointments o Return Appointment in 1 week. Off-Loading Wound #1 Sacrum o Mattress - Continue air mattress o Turn and reposition every 2 hours Additional Orders / Instructions Wound #1 Sacrum o Increase protein intake. o Activity as tolerated o Other: - Please add vitamin A, vitamin C, multivitamin and zinc supplements to your diet - available over the counter [place] #1 [place] Visits - [facility]- HHRN to initiate NPWT once available and change dressing 3 times weekly o [place] Nurse may visit PRN to address patientos wound care needs. ROM, Shae L. ([id number]) o FACE TO FACE ENCOUNTER: MEDICARE and MEDICAID PATIENTS: I certify that this patient is under my care and that I had a face-to-face encounter that meets the physician face-to-face encounter requirements with this patient on this date. The encounter with the patient was in whole or in part for the following MEDICAL CONDITION: (primary reason for [place]) MEDICAL NECESSITY: I certify, that based on my findings, NURSING services are a medically necessary home health service. HOME BOUND STATUS: I certify that  my clinical findings support that this patient is homebound (i.e., Due to illness or injury, pt requires aid of supportive devices such as crutches, cane, wheelchairs, walkers, the use of special transportation or the assistance of another person to leave their place of residence. There is a normal inability to leave the home and doing so requires considerable and taxing effort. Other absences are for medical reasons / religious services and are infrequent or of short duration when for other reasons). o If current dressing causes regression in wound condition, may D/C ordered dressing product/s and apply Normal Saline Moist Dressing daily until next [place] / Other MD appointment. Notify  [facility] of regression in wound condition at [phone]. o Please direct any NON-WOUND related issues/requests for orders to patient's Primary Care Physician Negative Pressure Wound Therapy Wound #1 Sacrum o Wound VAC settings at 125/130 mmHg continuous pressure. Use BLACK/GREEN foam to wound cavity. Use WHITE foam to fill any tunnel/s and/or undermining. Change VAC dressing 3 X WEEK. Change canister as indicated when full. Nurse may titrate settings and frequency of dressing changes as clinically indicated. - HHRN to initiate NPWT once available and change dressing 3 times weekly Medications-please add to medication list. Wound #1 Sacrum o Santyl Enzymatic Ointment Electronic Signature(s) Signed: 11/13/2017 3:15:43 PM By: Christin Fudge MD, FACS Signed: 11/13/2017 4:52:42 PM By: Montey Hora Entered By: Montey Hora on 11/13/2017 13:55:22 Galan, Charnell L. ([id number]) -------------------------------------------------------------------------------- Problem List Details Patient Name: Celli, Vaida L. Date of Service: 11/13/2017 1:30 PM Medical Record Number: [id number] Patient Account Number: [id number] Date of Birth/Sex: 21-Nov-1930 (83 y.o. Female) Treating RN:  Montey Hora Primary Care Provider: Priscille Kluver Other Clinician: Referring Provider: Priscille Kluver Treating Provider/Extender: Frann Rider in Treatment: 5 Active Problems ICD-10 Encounter Code Description Active Date Diagnosis L89.154 Pressure ulcer of sacral region, stage 4 10/09/2017 Yes E44.0 Moderate protein-calorie malnutrition 10/09/2017 Yes Z99.3 Dependence on wheelchair 10/09/2017 Yes Inactive Problems Resolved Problems Electronic Signature(s) Signed: 11/13/2017 2:16:33 PM By: Christin Fudge MD, FACS Entered By: Christin Fudge on 11/13/2017 [place], [place] L. ([id number]) -------------------------------------------------------------------------------- Progress Note Details Patient Name: Polack, Tricia L. Date of Service: 11/13/2017 1:30 PM Medical Record Number: [id number] Patient Account Number: [id number] Date of Birth/Sex: 1930-02-23 (83 y.o. Female) Treating RN: Montey Hora Primary Care Provider: Priscille Kluver Other Clinician: Referring Provider: Priscille Kluver Treating Provider/Extender: Frann Rider in Treatment: 5 Subjective Chief Complaint Information obtained from Patient Patient is at the clinic for treatment of an open pressure ulcer to the sacral region which she's had since over 3 months History of Present Illness (HPI) The following HPI elements were documented for the patient's wound: Location: sacral pressure ulcer Quality: Patient reports No Pain. Severity: Patient states wound (s) are getting better. Duration: Patient has had the wound for > 3 months prior to seeking treatment at the wound center Context: The wound would happen gradually Modifying Factors: Other treatment(s) tried include:surgical debridement, IV antibiotics and occupational therapy and physical therapy Associated Signs and Symptoms: Patient reports having:appropriate treatments and home health 83 year old patient who has been referred to [place] for the sacral  decubitus ulcer was admitted to the [facility] on 07/18/2017 for an acute sacral decubitus with osteomyelitis. She was treated there with IV antibiotics and surgical debridement was done on 08/06/2017 and transferred back to a swing bed 08/12/2017. She has completed antibiotics on 09/18/2017 and has been now living at home. Past medical history of GERD, fracture of right humerus, hypertension, rectal cancer, status post colostomy, laparoscopic cholecystectomy, debridement of sacral decubitus ulcer including subcutaneous tissue muscle and bone done on 08/06/2017, previous rectal cancer surgery in April 2017. Reviewing notes from the [facility] it was noted that she was treated for a MRSA sacral osteomyelitis and Proteus bacteremia requiring IV antibiotics which included clindamycin and vancomycin. Vancomycin had to be stopped on September 25 due to neutropenia and thrombocytopenia and changed to daptomycin until 09/20/2017. Infectious disease then advised to switch to clindamycin and the PICC line was removed on 09/22/2017. review of the electronic medical records noted that cultures of bone and tissue taken from the OR for wound debridement on 95 grew MRSA and  she was started on IV antibiotics initially clindamycin and switched to IV vancomycin and the Proteus bacteremia was stained treated with by mouth levofloxacin and Flagyl for 6 weeks to end on 09/03/2017. She was also advised an offloading mattress and would follow-up with supportive care 10/17/2017 -- the patient is ambulating a bit with the help of OT and PT and she is also eating much better. She has got inappropriate air mattress in place. 11/06/2017 -- the patient is a bit more ambivalent and continues to work with her mobilization. She is not yet ready for a wound VAC. Patient History Information obtained from Patient. Family History Cancer - Child, No family history of Diabetes, Heart Disease, Hereditary Spherocytosis,  Hypertension, Kidney Disease, Lung Disease, Seizures, Stroke, Thyroid Problems, Tuberculosis. Alin, [place] Say[place] L. ([id number]) Social History Never smoker, Marital Status - Married, Alcohol Use - Never, Drug Use - No History, Caffeine Use - Daily. Medical And Surgical History Notes Gastrointestinal colostomy since 1990 Oncologic colorectal - 1990 Objective Constitutional Pulse regular. Respirations normal and unlabored. Afebrile. Vitals Time Taken: 1:37 PM, Height: 64 in, Weight: 200 lbs, BMI: 34.3, Temperature: 97.9 F, Pulse: 78 bpm, Respiratory Rate: 18 breaths/min, Blood Pressure: 210/75 mmHg. Eyes Nonicteric. Reactive to light. Ears, Nose, Mouth, and Throat Lips, teeth, and gums WNL.Marland Kitchen Moist mucosa without lesions. Neck supple and nontender. No palpable supraclavicular or cervical adenopathy. Normal sized without goiter. Respiratory WNL. No retractions.. Cardiovascular Pedal Pulses WNL. No clubbing, cyanosis or edema. Lymphatic No adneopathy. No adenopathy. No adenopathy. Musculoskeletal Adexa without tenderness or enlargement.. Digits and nails w/o clubbing, cyanosis, infection, petechiae, ischemia, or inflammatory conditions.Marland Kitchen Psychiatric Judgement and insight Intact.. No evidence of depression, anxiety, or agitation.. General Notes: the patient's sacral decubitus wound continues to have good granulation tissue and there is very minimal slough at the inferior portion. There is no bone palpable. Integumentary (Hair, Skin) No suspicious lesions. No crepitus or fluctuance. No peri-wound warmth or erythema. No masses.[name], [place] ([id number]) Wound #1 status is Open. Original cause of wound was Pressure Injury. The wound is located on the Sacrum. The wound measures 1cm length x 0.6cm width x 1.5cm depth; 0.471cm^2 area and 0.707cm^3 volume. There is tendon, Fat Layer (Subcutaneous Tissue) Exposed, and fascia exposed. There is no tunneling noted, however, there is  undermining starting at 12:00 and ending at 12:00 with a maximum distance of 1.7cm. There is a large amount of serous drainage noted. The wound margin is flat and intact. There is large (67-100%) red granulation within the wound bed. There is a small (1-33%) amount of necrotic tissue within the wound bed including Eschar and Adherent Slough. The periwound skin appearance exhibited: Scarring. The periwound skin appearance did not exhibit: Callus, Crepitus, Excoriation, Induration, Rash, Dry/Scaly, Maceration, Atrophie Blanche, Cyanosis, Ecchymosis, Hemosiderin Staining, Mottled, Pallor, Rubor, Erythema. Periwound temperature was noted as No Abnormality. Assessment Active Problems ICD-10 L89.154 - Pressure ulcer of sacral region, stage 4 E44.0 - Moderate protein-calorie malnutrition Z99.3 - Dependence on wheelchair Plan Wound Cleansing: Wound #1 Sacrum: Clean wound with Normal Saline. Cleanse wound with mild soap and water May Shower, gently pat wound dry prior to applying new dressing. Skin Barriers/Peri-Wound Care: Wound #1 Sacrum: Skin Prep Primary Wound Dressing: Wound #1 Sacrum: Santyl Ointment Secondary Dressing: Wound #1 Sacrum: Dry Gauze - pack lightly with gauze Boardered Foam Dressing Dressing Change Frequency: Wound #1 Sacrum: Change dressing every day. Follow-up Appointments: Return Appointment in 1 week. Off-Loading: Wound #1 Sacrum: Mattress - Continue air mattress Turn and reposition  every 2 hours Additional Orders / Instructions: Wound #1 Sacrum: Increase protein intake. Activity as tolerated Acoff, [place] ([id number]) Other: - Please add vitamin A, vitamin C, multivitamin and zinc supplements to your diet - available over the counter Home Health: Wound #1 Sacrum: [place] Visits - [facility]- HHRN to initiate NPWT once available and change dressing 3 times weekly Home Health Nurse may visit PRN to address patient s wound care needs. FACE TO  FACE ENCOUNTER: MEDICARE and MEDICAID PATIENTS: I certify that this patient is under my care and that I had a face-to-face encounter that meets the physician face-to-face encounter requirements with this patient on this date. The encounter with the patient was in whole or in part for the following MEDICAL CONDITION: (primary reason for [place]) MEDICAL NECESSITY: I certify, that based on my findings, NURSING services are a medically necessary home health service. HOME BOUND STATUS: I certify that my clinical findings support that this patient is homebound (i.e., Due to illness or injury, pt requires aid of supportive devices such as crutches, cane, wheelchairs, walkers, the use of special transportation or the assistance of another person to leave their place of residence. There is a normal inability to leave the home and doing so requires considerable and taxing effort. Other absences are for medical reasons / religious services and are infrequent or of short duration when for other reasons). If current dressing causes regression in wound condition, may D/C ordered dressing product/s and apply Normal Saline Moist Dressing daily until next [place] / Other MD appointment. [place] of regression in wound condition at [phone]. Please direct any NON-WOUND related issues/requests for orders to patient's Primary Care Physician Negative Pressure Wound Therapy: Wound #1 Sacrum: Wound VAC settings at 125/130 mmHg continuous pressure. Use BLACK/GREEN foam to wound cavity. Use WHITE foam to fill any tunnel/s and/or undermining. Change VAC dressing 3 X WEEK. Change canister as indicated when full. Nurse may titrate settings and frequency of dressing changes as clinically indicated. - HHRN to initiate NPWT once available and change dressing 3 times weekly Medications-please add to medication list.: Wound #1 Sacrum: Santyl Enzymatic Ointment she is more active,  walking with help and getting off her sacral area on a regular basis. The wound is now clean enough to start a wound VAC and I have recommended this to be changed 3 times a week. We will continue to use Santyl under the black foam. After review today I have recommended: 1. Until she gets a wound VAC, Santyl ointment with local packing to be changed daily over the next week and covered with a bordered foam. 2. offloading has been discussed in great detail and we have discussed frequent mobilization and working with OT and PT 3. Adequate protein, vitamin A, vitamin C and zinc 4. Regular visits the wound center Electronic Signature(s) Signed: 11/13/2017 2:18:51 PM By: Christin Fudge MD, FACS Entered By: Christin Fudge on 11/13/2017 14:18:50 Leccese, [place]. ([id number]) -------------------------------------------------------------------------------- ROS/PFSH Details Patient Name: [name], Jossilyn L. Date of Service: 11/13/2017 1:30 PM Medical Record Number: [id number] Patient Account Number: [id number] Date of Birth/Sex: Dec 04, 1929 (83 y.o. Female) Treating RN: Montey Hora Primary Care Provider: Priscille Kluver Other Clinician: Referring Provider: Priscille Kluver Treating Provider/Extender: Frann Rider in Treatment: 5 Information Obtained From Patient Wound History Do you currently have one or more open woundso Yes How many open wounds do you currently haveo 1 Approximately how long have you had your woundso 3 months How have  you been treating your wound(s) until nowo santyl Has your wound(s) ever healed and then re-openedo No Have you had any lab work done in the past montho Yes Who ordered the lab work [facility] Have you tested positive for an antibiotic resistant organism (MRSA, VRE)o No Have you tested positive for osteomyelitis (bone infection)o Yes Have you had any tests for circulation on your legso No Hematologic/Lymphatic Medical History: Positive for:  Anemia Negative for: Hemophilia; Human Immunodeficiency Virus; Lymphedema; Sickle Cell Disease Respiratory Medical History: Negative for: Aspiration; Asthma; Chronic Obstructive Pulmonary Disease (COPD); Pneumothorax; Sleep Apnea; Tuberculosis Cardiovascular Medical History: Positive for: Congestive Heart Failure; Hypertension Negative for: Angina; Arrhythmia; Coronary Artery Disease; Deep Vein Thrombosis; Hypotension; Myocardial Infarction; Peripheral Arterial Disease; Peripheral Venous Disease Gastrointestinal Medical History: Negative for: Cirrhosis ; Colitis; Crohnos; Hepatitis A; Hepatitis B Past Medical History Notes: colostomy since 1990 Immunological Medical History: Negative for: Lupus Erythematosus; Raynaudos; Scleroderma Musculoskeletal Medical History: Positive for: Osteoarthritis; Osteomyelitis Hohmann, Blaike L. ([id number]) Negative for: Gout; Rheumatoid Arthritis Neurologic Medical History: Negative for: Dementia; Neuropathy; Quadriplegia Oncologic Medical History: Positive for: Received Chemotherapy; Received Radiation Past Medical History Notes: colorectal - 1990 Immunizations Pneumococcal Vaccine: Received Pneumococcal Vaccination: Yes Immunization Notes: up to date Implantable Devices Family and Social History Cancer: Yes - Child; Diabetes: No; Heart Disease: No; Hereditary Spherocytosis: No; Hypertension: No; Kidney Disease: No; Lung Disease: No; Seizures: No; Stroke: No; Thyroid Problems: No; Tuberculosis: No; Never smoker; Marital Status - Married; Alcohol Use: Never; Drug Use: No History; Caffeine Use: Daily; Financial Concerns: No; Food, Clothing or Shelter Needs: No; Support System Lacking: No; Transportation Concerns: No; Advanced Directives: Yes (Not Provided); Patient does not want information on Advanced Directives; Living Will: Yes (Not Provided); Medical Power of Attorney: Yes - son - Britzy Graul (Not Provided) Physician Affirmation I  have reviewed and agree with the above information. Electronic Signature(s) Signed: 11/13/2017 3:15:43 PM By: Christin Fudge MD, FACS Signed: 11/13/2017 4:52:42 PM By: Montey Hora Entered By: Christin Fudge on 11/13/2017 14:16:57 Neece, Monasia L. ([id number]) -------------------------------------------------------------------------------- SuperBill Details Patient Name: [name], Josefita L. Date of Service: 11/13/2017 Medical Record Number: [id number] Patient Account Number: [id number] Date of Birth/Sex: 01-08-1930 (83 y.o. Female) Treating RN: Montey Hora Primary Care Provider: Priscille Kluver Other Clinician: Referring Provider: Priscille Kluver Treating Provider/Extender: Frann Rider in Treatment: 5 Diagnosis Coding ICD-10 Codes Code Description L89.154 Pressure ulcer of sacral region, stage 4 E44.0 Moderate protein-calorie malnutrition Z99.3 Dependence on wheelchair Facility Procedures CPT4 Code: [id number] Description: [place] VISIT-LEV 3 EST PT Modifier: Quantity: 1 Physician Procedures CPT4 Code: 2979892 Description: 11941 - WC PHYS LEVEL 3 - EST PT ICD-10 Diagnosis Description L89.154 Pressure ulcer of sacral region, stage 4 E44.0 Moderate protein-calorie malnutrition Z99.3 Dependence on wheelchair Modifier: Quantity: 1 Electronic Signature(s) Signed: 11/13/2017 4:17:30 PM By: Christin Fudge MD, FACS Signed: 11/13/2017 4:52:42 PM By: Montey Hora Previous Signature: 11/13/2017 2:19:05 PM Version By: Christin Fudge MD, FACS Entered By: Montey Hora on 11/13/2017 15:39:37

## 2017-11-20 ENCOUNTER — Encounter: Payer: Medicare Other | Admitting: Nurse Practitioner

## 2017-11-20 DIAGNOSIS — L89154 Pressure ulcer of sacral region, stage 4: Secondary | ICD-10-CM | POA: Diagnosis not present

## 2017-11-22 NOTE — Progress Notes (Signed)
[name] ([id number]) Visit Report for 11/20/2017 Chief Complaint Document Details Patient Name: SCHADER, [name] Date of Service: 11/20/2017 12:30 PM Medical Record Number: [id number] Patient Account Number: [id number] Date of Birth/Sex: 1930/04/11 (83 y.o. Female) Treating RN: Montey Hora Primary Care Provider: Priscille Kluver Other Clinician: Referring Provider: Priscille Kluver Treating Provider/Extender: Cathie Olden in Treatment: 6 Information Obtained from: Patient Chief Complaint she is here in follow-up evaluation of a sacral ulcer Electronic Signature(s) Signed: 11/20/2017 4:55:16 PM By: Lawanda Cousins Entered By: Lawanda Cousins on 11/20/2017 13:24:21 [name] ([id number]) -------------------------------------------------------------------------------- Debridement Details Patient Name: [name], [name] Date of Service: 11/20/2017 12:30 PM Medical Record Number: [id number] Patient Account Number: [id number] Date of Birth/Sex: 06/12/1930 (83 y.o. Female) Treating RN: Montey Hora Primary Care Provider: Priscille Kluver Other Clinician: Referring Provider: Priscille Kluver Treating Provider/Extender: Cathie Olden in Treatment: 6 Debridement Performed for Wound #1 Sacrum Assessment: Performed By: Physician Lawanda Cousins, NP Debridement: Debridement Pre-procedure Verification/Time Yes - 13:05 Out Taken: Start Time: 13:05 Pain Control: Lidocaine 4% Topical Solution Level: Skin/Subcutaneous Tissue Total Area Debrided (L x W): 1 (cm) x 0.6 (cm) = 0.6 (cm) Tissue and other material Viable, Non-Viable, Fibrin/Slough, Subcutaneous debrided: Instrument: Curette Bleeding: Minimum Hemostasis Achieved: Pressure End Time: 13:09 Procedural Pain: 0 Post Procedural Pain: 0 Response to Treatment: Procedure was tolerated well Post Debridement Measurements of Total Wound Length: (cm) 1 Stage: Category/Stage IV Width: (cm) 0.6 Depth: (cm) 1.6 Volume:  (cm) 0.754 Character of Wound/Ulcer Post Improved Debridement: Post Procedure Diagnosis Same as Pre-procedure Electronic Signature(s) Signed: 11/20/2017 4:38:02 PM By: Montey Hora Signed: 11/20/2017 4:55:16 PM By: Lawanda Cousins Entered By: Lawanda Cousins on 11/20/2017 13:23:58 [name] -------------------------------------------------------------------------------- HPI Details Patient Name: [name], [name] Date of Service: 11/20/2017 12:30 PM Medical Record Number: [id number] Patient Account Number: [id number] Date of Birth/Sex: Apr 05, 1930 (83 y.o. Female) Treating RN: Montey Hora Primary Care Provider: Priscille Kluver Other Clinician: Referring Provider: Priscille Kluver Treating Provider/Extender: Cathie Olden in Treatment: 6 History of Present Illness Location: sacral pressure ulcer Quality: Patient reports No Pain. Severity: Patient states wound (s) are getting better. Duration: Patient has had the wound for > 3 months prior to seeking treatment at the wound center Context: The wound would happen gradually Modifying Factors: Other treatment(s) tried include:surgical debridement, IV antibiotics and occupational therapy and physical therapy Associated Signs and Symptoms: Patient reports having:appropriate treatments and home health HPI Description: 83 year old patient who has been referred to [place] for the sacral decubitus ulcer was admitted to the [facility] on 07/18/2017 for an acute sacral decubitus with osteomyelitis. She was treated there with IV antibiotics and surgical debridement was done on 08/06/2017 and transferred back to a swing bed 08/12/2017. She has completed antibiotics on 09/18/2017 and has been now living at home. Past medical history of GERD, fracture of right humerus, hypertension, rectal cancer, status post colostomy, laparoscopic cholecystectomy, debridement of sacral decubitus ulcer including subcutaneous tissue muscle and  bone done on 08/06/2017, previous rectal cancer surgery in April 2017. Reviewing notes from the [facility] it was noted that she was treated for a MRSA sacral osteomyelitis and Proteus bacteremia requiring IV antibiotics which included clindamycin and vancomycin. Vancomycin had to be stopped on September 25 due to neutropenia and thrombocytopenia and changed to daptomycin until 09/20/2017. Infectious disease then advised to switch to clindamycin and the PICC line was removed on 09/22/2017. review of the electronic medical records noted that cultures of bone and tissue taken from the OR for  wound debridement on 95 grew MRSA and she was started on IV antibiotics initially clindamycin and switched to IV vancomycin and the Proteus bacteremia was stained treated with by mouth levofloxacin and Flagyl for 6 weeks to end on 09/03/2017. She was also advised an offloading mattress and would follow-up with supportive care 10/17/2017 -- the patient is ambulating a bit with the help of OT and PT and she is also eating much better. She has got inappropriate air mattress in place. 11/06/2017 -- the patient is a bit more ambivalent and continues to work with her mobilization. She is not yet ready for a wound VAC. 11/20/17 she is here in follow-up evaluation of a sacral ulcer. Wound VAC has not been improved, will continue with Santyl and anticipate that she can transfer to wound VAC at next appointment, bone fragments present in wound, no indication of infection Electronic Signature(s) Signed: 11/20/2017 4:55:16 PM By: Lawanda Cousins Entered By: Lawanda Cousins on 11/20/2017 13:25:27 [name] -------------------------------------------------------------------------------- Physician Orders Details Patient Name: [name], [name] Date of Service: 11/20/2017 12:30 PM Medical Record Number: [id number] Patient Account Number: [id number] Date of Birth/Sex: 1930/08/04 (83 y.o.  Female) Treating RN: Montey Hora Primary Care Provider: Priscille Kluver Other Clinician: Referring Provider: Priscille Kluver Treating Provider/Extender: Cathie Olden in Treatment: 6 Verbal / Phone Orders: No Diagnosis Coding Wound Cleansing Wound #1 Sacrum o Clean wound with Normal Saline. o Cleanse wound with mild soap and water o May Shower, gently pat wound dry prior to applying new dressing. Anesthetic (add to Medication List) Wound #1 Sacrum o Topical Lidocaine 4% cream applied to wound bed prior to debridement (In Clinic Only). Skin Barriers/Peri-Wound Care Wound #1 Sacrum o Skin Prep Primary Wound Dressing Wound #1 Sacrum o Santyl Ointment Secondary Dressing Wound #1 Sacrum o Dry Gauze - pack lightly with gauze o Boardered Foam Dressing Dressing Change Frequency Wound #1 Sacrum o Change dressing every day. Follow-up Appointments o Return Appointment in 1 week. Off-Loading Wound #1 Sacrum o Mattress - Continue air mattress o Turn and reposition every 2 hours Additional Orders / Instructions Wound #1 Sacrum o Increase protein intake. o Activity as tolerated o Other: - Please add vitamin A, vitamin C, multivitamin and zinc supplements to your diet - available over the counter [place], [name] ([id number]) [place] #1 Lucky Visits - [facility]- HHRN to initiate NPWT once available and change dressing 3 times weekly o [place] Nurse may visit PRN to address patientos wound care needs. o FACE TO FACE ENCOUNTER: MEDICARE and MEDICAID PATIENTS: I certify that this patient is under my care and that I had a face-to-face encounter that meets the physician face-to-face encounter requirements with this patient on this date. The encounter with the patient was in whole or in part for the following MEDICAL CONDITION: (primary reason for [place]) MEDICAL NECESSITY: I certify, that based on my  findings, NURSING services are a medically necessary home health service. HOME BOUND STATUS: I certify that my clinical findings support that this patient is homebound (i.e., Due to illness or injury, pt requires aid of supportive devices such as crutches, cane, wheelchairs, walkers, the use of special transportation or the assistance of another person to leave their place of residence. There is a normal inability to leave the home and doing so requires considerable and taxing effort. Other absences are for medical reasons / religious services and are infrequent or of short duration when for other reasons). o If  current dressing causes regression in wound condition, may D/C ordered dressing product/s and apply Normal Saline Moist Dressing daily until next [place] / Other MD appointment. [place] of regression in wound condition at [phone]. o Please direct any NON-WOUND related issues/requests for orders to patient's Primary Care Physician Negative Pressure Wound Therapy Wound #1 Sacrum o Wound VAC settings at 125/130 mmHg continuous pressure. Use BLACK/GREEN foam to wound cavity. Use WHITE foam to fill any tunnel/s and/or undermining. Change VAC dressing 3 X WEEK. Change canister as indicated when full. Nurse may titrate settings and frequency of dressing changes as clinically indicated. - HHRN to initiate NPWT once available and change dressing 3 times weekly Medications-please add to medication list. Wound #1 Sacrum o Santyl Enzymatic Ointment Electronic Signature(s) Signed: 11/20/2017 4:55:16 PM By: Lawanda Cousins Entered By: Lawanda Cousins on 11/20/2017 13:26:00 Gough, [name] ([id number]) -------------------------------------------------------------------------------- Problem List Details Patient Name: Schmoker, Kinsleigh L. Date of Service: 11/20/2017 12:30 PM Medical Record Number: [id number] Patient Account Number: [id number] Date of  Birth/Sex: Dec 28, 1929 (83 y.o. Female) Treating RN: Montey Hora Primary Care Provider: Priscille Kluver Other Clinician: Referring Provider: Priscille Kluver Treating Provider/Extender: Cathie Olden in Treatment: 6 Active Problems ICD-10 Encounter Code Description Active Date Diagnosis L89.154 Pressure ulcer of sacral region, stage 4 10/09/2017 Yes E44.0 Moderate protein-calorie malnutrition 10/09/2017 Yes Z99.3 Dependence on wheelchair 10/09/2017 Yes Inactive Problems Resolved Problems Electronic Signature(s) Signed: 11/20/2017 4:55:16 PM By: Lawanda Cousins Entered By: Lawanda Cousins on 11/20/2017 13:23:33 [place], [name] ([id number]) -------------------------------------------------------------------------------- Progress Note Details Patient Name: Puglia, [name] Date of Service: 11/20/2017 12:30 PM Medical Record Number: [id number] Patient Account Number: [id number] Date of Birth/Sex: [birth year] (83 y.o. Female) Treating RN: Montey Hora Primary Care Provider: Priscille Kluver Other Clinician: Referring Provider: Priscille Kluver Treating Provider/Extender: Cathie Olden in Treatment: 6 Subjective Chief Complaint Information obtained from Patient she is here in follow-up evaluation of a sacral ulcer History of Present Illness (HPI) The following HPI elements were documented for the patient's wound: Location: sacral pressure ulcer Quality: Patient reports No Pain. Severity: Patient states wound (s) are getting better. Duration: Patient has had the wound for > 3 months prior to seeking treatment at the wound center Context: The wound would happen gradually Modifying Factors: Other treatment(s) tried include:surgical debridement, IV antibiotics and occupational therapy and physical therapy Associated Signs and Symptoms: Patient reports having:appropriate treatments and home health 83 year old patient who has been referred to [place] for the sacral decubitus ulcer was admitted  to the [facility] on 07/18/2017 for an acute sacral decubitus with osteomyelitis. She was treated there with IV antibiotics and surgical debridement was done on 08/06/2017 and transferred back to a swing bed 08/12/2017. She has completed antibiotics on 09/18/2017 and has been now living at home. Past medical history of GERD, fracture of right humerus, hypertension, rectal cancer, status post colostomy, laparoscopic cholecystectomy, debridement of sacral decubitus ulcer including subcutaneous tissue muscle and bone done on 08/06/2017, previous rectal cancer surgery in April 2017. Reviewing notes from the [facility] it was noted that she was treated for a MRSA sacral osteomyelitis and Proteus bacteremia requiring IV antibiotics which included clindamycin and vancomycin. Vancomycin had to be stopped on September 25 due to neutropenia and thrombocytopenia and changed to daptomycin until 09/20/2017. Infectious disease then advised to switch to clindamycin and the PICC line was removed on 09/22/2017. review of the electronic medical records noted that cultures of bone and tissue taken from the OR for wound debridement  on 95 grew MRSA and she was started on IV antibiotics initially clindamycin and switched to IV vancomycin and the Proteus bacteremia was stained treated with by mouth levofloxacin and Flagyl for 6 weeks to end on 09/03/2017. She was also advised an offloading mattress and would follow-up with supportive care 10/17/2017 -- the patient is ambulating a bit with the help of OT and PT and she is also eating much better. She has got inappropriate air mattress in place. 11/06/2017 -- the patient is a bit more ambivalent and continues to work with her mobilization. She is not yet ready for a wound VAC. 11/20/17 she is here in follow-up evaluation of a sacral ulcer. Wound VAC has not been improved, will continue with Santyl and anticipate that she can transfer to wound VAC at next  appointment, bone fragments present in wound, no indication of infection Patient History Information obtained from Patient. Family History Cancer - Child, MONQUE, HAGGAR ([id number]) No family history of Diabetes, Heart Disease, Hereditary Spherocytosis, Hypertension, Kidney Disease, Lung Disease, Seizures, Stroke, Thyroid Problems, Tuberculosis. Social History Never smoker, Marital Status - Married, Alcohol Use - Never, Drug Use - No History, Caffeine Use - Daily. Medical And Surgical History Notes Gastrointestinal colostomy since 1990 Oncologic colorectal - 1990 Objective Constitutional Vitals Time Taken: 12:46 PM, Height: 64 in, Weight: 200 lbs, BMI: 34.3, Temperature: 98.0 F, Pulse: 54 bpm, Respiratory Rate: 16 breaths/min, Blood Pressure: 163/64 mmHg. Integumentary (Hair, Skin) Wound #1 status is Open. Original cause of wound was Pressure Injury. The wound is located on the Sacrum. The wound measures 1cm length x 0.6cm width x 1.5cm depth; 0.471cm^2 area and 0.707cm^3 volume. There is tendon, Fat Layer (Subcutaneous Tissue) Exposed, and fascia exposed. There is no tunneling noted, however, there is undermining starting at 12:00 and ending at 12:00 with a maximum distance of 1.5cm. There is a large amount of serous drainage noted. The wound margin is flat and intact. There is large (67-100%) red granulation within the wound bed. There is a small (1-33%) amount of necrotic tissue within the wound bed including Eschar and Adherent Slough. The periwound skin appearance exhibited: Scarring. The periwound skin appearance did not exhibit: Callus, Crepitus, Excoriation, Induration, Rash, Dry/Scaly, Maceration, Atrophie Blanche, Cyanosis, Ecchymosis, Hemosiderin Staining, Mottled, Pallor, Rubor, Erythema. Periwound temperature was noted as No Abnormality. Assessment Active Problems ICD-10 L89.154 - Pressure ulcer of sacral region, stage 4 E44.0 - Moderate protein-calorie  malnutrition Z99.3 - Dependence on wheelchair Procedures [place], [place] ([id number]) Wound #1 Pre-procedure diagnosis of Wound #1 is a Pressure Ulcer located on the Sacrum . There was a Skin/Subcutaneous Tissue Debridement ([id number]) debridement with total area of 0.6 sq cm performed by Lawanda Cousins, NP. with the following instrument(s): Curette to remove Viable and Non-Viable tissue/material including Fibrin/Slough and Subcutaneous after achieving pain control using Lidocaine 4% Topical Solution. A time out was conducted at 13:05, prior to the start of the procedure. A Minimum amount of bleeding was controlled with Pressure. The procedure was tolerated well with a pain level of 0 throughout and a pain level of 0 following the procedure. Post Debridement Measurements: 1cm length x 0.6cm width x 1.6cm depth; 0.754cm^3 volume. Post debridement Stage noted as Category/Stage IV. Character of Wound/Ulcer Post Debridement is improved. Post procedure Diagnosis Wound #1: Same as Pre-Procedure Plan Wound Cleansing: Wound #1 Sacrum: Clean wound with Normal Saline. Cleanse wound with mild soap and water May Shower, gently pat wound dry prior to applying new dressing. Anesthetic (add to Medication List):  Wound #1 Sacrum: Topical Lidocaine 4% cream applied to wound bed prior to debridement (In Clinic Only). Skin Barriers/Peri-Wound Care: Wound #1 Sacrum: Skin Prep Primary Wound Dressing: Wound #1 Sacrum: Santyl Ointment Secondary Dressing: Wound #1 Sacrum: Dry Gauze - pack lightly with gauze Boardered Foam Dressing Dressing Change Frequency: Wound #1 Sacrum: Change dressing every day. Follow-up Appointments: Return Appointment in 1 week. Off-Loading: Wound #1 Sacrum: Mattress - Continue air mattress Turn and reposition every 2 hours Additional Orders / Instructions: Wound #1 Sacrum: Increase protein intake. Activity as tolerated Other: - Please add vitamin A, vitamin C,  multivitamin and zinc supplements to your diet - available over the counter Home Health: Wound #1 Sacrum: [name] Visits - [facility]- HHRN to initiate NPWT once available and change dressing 3 times weekly Home Health Nurse may visit PRN to address patient s wound care needs. FACE TO FACE ENCOUNTER: MEDICARE and MEDICAID PATIENTS: I certify that this patient is under my care and that I had a face-to-face encounter that meets the physician face-to-face encounter requirements with this patient on this date. The encounter with the patient was in whole or in part for the following MEDICAL CONDITION: (primary reason for [place]) MEDICAL NECESSITY: I certify, that based on my findings, NURSING services are a medically necessary home RASN[place], [place] ([id number]) health service. HOME BOUND STATUS: I certify that my clinical findings support that this patient is homebound (i.e., Due to illness or injury, pt requires aid of supportive devices such as crutches, cane, wheelchairs, walkers, the use of special transportation or the assistance of another person to leave their place of residence. There is a normal inability to leave the home and doing so requires considerable and taxing effort. Other absences are for medical reasons / religious services and are infrequent or of short duration when for other reasons). If current dressing causes regression in wound condition, may D/C ordered dressing product/s and apply Normal Saline Moist Dressing daily until next [place] / Other MD appointment. [place] of regression in wound condition at [phone]. Please direct any NON-WOUND related issues/requests for orders to patient's Primary Care Physician Negative Pressure Wound Therapy: Wound #1 Sacrum: Wound VAC settings at 125/130 mmHg continuous pressure. Use BLACK/GREEN foam to wound cavity. Use WHITE foam to fill any tunnel/s and/or undermining. Change VAC  dressing 3 X WEEK. Change canister as indicated when full. Nurse may titrate settings and frequency of dressing changes as clinically indicated. - HHRN to initiate NPWT once available and change dressing 3 times weekly Medications-please add to medication list.: Wound #1 Sacrum: Santyl Enzymatic Ointment 1. continue with santly 2. continue with offloading 3. follow up nect week with anticipation of NPWT Electronic Signature(s) Signed: 11/20/2017 4:55:16 PM By: Lawanda Cousins Entered By: Lawanda Cousins on 11/20/2017 13:26:34 [place], [place]. ([id number]) -------------------------------------------------------------------------------- ROS/PFSH Details Patient Name: [name], Lucerito L. Date of Service: 11/20/2017 12:30 PM Medical Record Number: [id number] Patient Account Number: [id number] Date of Birth/Sex: November 21, 1930 (83 y.o. Female) Treating RN: Montey Hora Primary Care Provider: Priscille Kluver Other Clinician: Referring Provider: Priscille Kluver Treating Provider/Extender: Cathie Olden in Treatment: 6 Information Obtained From Patient Wound History Do you currently have one or more open woundso Yes How many open wounds do you currently haveo 1 Approximately how long have you had your woundso 3 months How have you been treating your wound(s) until nowo santyl Has your wound(s) ever healed and then re-openedo No Have you had any lab work done in  the past montho Yes Who ordered the lab work [facility] Have you tested positive for an antibiotic resistant organism (MRSA, VRE)o No Have you tested positive for osteomyelitis (bone infection)o Yes Have you had any tests for circulation on your legso No Hematologic/Lymphatic Medical History: Positive for: Anemia Negative for: Hemophilia; Human Immunodeficiency Virus; Lymphedema; Sickle Cell Disease Respiratory Medical History: Negative for: Aspiration; Asthma; Chronic Obstructive Pulmonary Disease (COPD); Pneumothorax;  Sleep Apnea; Tuberculosis Cardiovascular Medical History: Positive for: Congestive Heart Failure; Hypertension Negative for: Angina; Arrhythmia; Coronary Artery Disease; Deep Vein Thrombosis; Hypotension; Myocardial Infarction; Peripheral Arterial Disease; Peripheral Venous Disease Gastrointestinal Medical History: Negative for: Cirrhosis ; Colitis; Crohnos; Hepatitis A; Hepatitis B Past Medical History Notes: colostomy since 1990 Immunological Medical History: Negative for: Lupus Erythematosus; Raynaudos; Scleroderma Musculoskeletal Medical History: Positive for: Osteoarthritis; Osteomyelitis Haun, [name] ([id number]) Negative for: Gout; Rheumatoid Arthritis Neurologic Medical History: Negative for: Dementia; Neuropathy; Quadriplegia Oncologic Medical History: Positive for: Received Chemotherapy; Received Radiation Past Medical History Notes: colorectal - 1990 Immunizations Pneumococcal Vaccine: Received Pneumococcal Vaccination: Yes Immunization Notes: up to date Implantable Devices Family and Social History Cancer: Yes - Child; Diabetes: No; Heart Disease: No; Hereditary Spherocytosis: No; Hypertension: No; Kidney Disease: No; Lung Disease: No; Seizures: No; Stroke: No; Thyroid Problems: No; Tuberculosis: No; Never smoker; Marital Status - Married; Alcohol Use: Never; Drug Use: No History; Caffeine Use: Daily; Financial Concerns: No; Food, Clothing or Shelter Needs: No; Support System Lacking: No; Transportation Concerns: No; Advanced Directives: Yes (Not Provided); Patient does not want information on Advanced Directives; Living Will: Yes (Not Provided); Medical Power of Attorney: Yes - son - [name] (Not Provided) Physician Affirmation I have reviewed and agree with the above information. Electronic Signature(s) Signed: 11/20/2017 4:38:02 PM By: Montey Hora Signed: 11/20/2017 4:55:16 PM By: Lawanda Cousins Entered By: Lawanda Cousins on 11/20/2017  13:25:40 [place], [place] ([id number]) -------------------------------------------------------------------------------- SuperBill Details Patient Name: [name], Carrieann L. Date of Service: 11/20/2017 Medical Record Number: [id number] Patient Account Number: [id number] Date of Birth/Sex: 26-Apr-1930 (83 y.o. Female) Treating RN: Montey Hora Primary Care Provider: Priscille Kluver Other Clinician: Referring Provider: Priscille Kluver Treating Provider/Extender: Cathie Olden in Treatment: 6 Diagnosis Coding ICD-10 Codes Code Description L89.154 Pressure ulcer of sacral region, stage 4 E44.0 Moderate protein-calorie malnutrition Z99.3 Dependence on wheelchair Facility Procedures CPT4 Code: [id number] Description: [place] TISSUE 20 SQ CM/< ICD-10 Diagnosis Description L89.154 Pressure ulcer of sacral region, stage 4 Modifier: Quantity: 1 Physician Procedures CPT4 Code: [id number] Description: 11042 - WC PHYS SUBQ TISS 20 SQ CM ICD-10 Diagnosis Description L89.154 Pressure ulcer of sacral region, stage 4 Modifier: Quantity: 1 Electronic Signature(s) Signed: 11/20/2017 4:55:16 PM By: Lawanda Cousins Entered By: Lawanda Cousins on 11/20/2017 13:26:46

## 2017-11-22 NOTE — Progress Notes (Signed)
[name] ([id number]) Visit Report for 11/20/2017 Arrival Information Details Patient Name: [name], [name] Date of Service: 11/20/2017 12:30 PM Medical Record Number: [id number] Patient Account Number: [id number] Date of Birth/Sex: 03/06/1930 (83 y.o. Female) Treating RN: [name] Primary Care [name]: [name] Other Clinician: Referring [name]: [name] Treating [name]/Extender: [name] in Treatment: 6 Visit Information History Since Last Visit Added or deleted any medications: No Patient Arrived: Wheel Chair Any new allergies or adverse reactions: No Arrival Time: 12:45 Had a fall or experienced change in No Accompanied By: caregiver activities of daily living that may affect Transfer Assistance: Manual risk of falls: Patient Identification Verified: Yes Signs or symptoms of abuse/neglect since last visito No Secondary Verification Process Completed: Yes Hospitalized since last visit: No Has Dressing in Place as Prescribed: Yes Pain Present Now: No Electronic Signature(s) Signed: 11/20/2017 4:38:02 PM By: [name] Entered By: [name] on 11/20/2017 12:45:55 Pingley, Shali L. ([id number]) -------------------------------------------------------------------------------- Encounter Discharge Information Details Patient Name: [name], [name] Date of Service: 11/20/2017 12:30 PM Medical Record Number: [id number] Patient Account Number: [id number] Date of Birth/Sex: [birth year] (83 y.o. Female) Treating RN: [name] Primary Care [name]: [name] Other Clinician: Referring [name]: [name] Treating [name]/Extender: [name] in Treatment: 6 Encounter Discharge Information Items Discharge Pain Level: 0 Discharge Condition: Stable Ambulatory Status: Wheelchair Discharge Destination: Home Transportation: Private Auto Accompanied By: caregiver Schedule Follow-up Appointment: Yes Medication  Reconciliation completed and No provided to Patient/Care [name]: Provided on Clinical Summary of Care: 11/20/2017 Form Type Recipient Paper Patient RA Electronic Signature(s) Signed: 11/20/2017 2:45:04 PM By: [name] Entered By: [name] on 11/20/2017 14:45:03 [place], [place] ([id number]) -------------------------------------------------------------------------------- Multi Wound Chart Details Patient Name: [name], [name] Date of Service: 11/20/2017 12:30 PM Medical Record Number: [id number] Patient Account Number: [id number] Date of Birth/Sex: Oct 05, 1930 (83 y.o. Female) Treating RN: [name] Primary Care [name]: [name] Other Clinician: Referring Lashelle Koy: [name] Treating Serria Sloma/Extender: [name] in Treatment: 6 Vital Signs Height(in): 64 Pulse(bpm): 22 Weight(lbs): 200 Blood Pressure(mmHg): 163/64 Body Mass Index(BMI): 34 Temperature(F): 98.0 Respiratory Rate 16 (breaths/min): Photos: [1:No Photos] [N/A:N/A] Wound Location: [1:Sacrum] [N/A:N/A] Wounding Event: [1:Pressure Injury] [N/A:N/A] Primary Etiology: [1:Pressure Ulcer] [N/A:N/A] Comorbid History: [1:Anemia, Congestive Heart Failure, Hypertension, Osteoarthritis, Osteomyelitis, Received Chemotherapy, Received Radiation] [N/A:N/A] Date Acquired: [1:07/14/2017] [N/A:N/A] Weeks of Treatment: [1:6] [N/A:N/A] Wound Status: [1:Open] [N/A:N/A] Measurements L x W x D [1:1x0.6x1.5] [N/A:N/A] (cm) Area (cm) : [1:0.471] [N/A:N/A] Volume (cm) : [1:0.707] [N/A:N/A] % Reduction in Area: [1:58.40%] [N/A:N/A] % Reduction in Volume: [1:51.90%] [N/A:N/A] Starting Position 1 [1:12] (o'clock): Ending Position 1 [1:12] (o'clock): Maximum Distance 1 (cm): [1:1.5] Undermining: [1:Yes] [N/A:N/A] Classification: [1:Category/Stage IV] [N/A:N/A] Exudate Amount: [1:Large] [N/A:N/A] Exudate Type: [1:Serous] [N/A:N/A] Exudate Color: [1:amber] [N/A:N/A] Wound Margin: [1:Flat  and Intact] [N/A:N/A] Granulation Amount: [1:Large (67-100%)] [N/A:N/A] Granulation Quality: [1:Red] [N/A:N/A] Necrotic Amount: [1:Small (1-33%)] [N/A:N/A] Necrotic Tissue: [1:Eschar, Adherent Slough] [N/A:N/A] Exposed Structures: [1:Fascia: Yes Fat Layer (Subcutaneous Tissue) Exposed: Yes Tendon: Yes] [N/A:N/A] Muscle: No Joint: No Bone: No Epithelialization: None N/A N/A Debridement: Debridement ([id number]) N/A N/A Pre-procedure 13:05 N/A N/A Verification/Time Out Taken: Pain Control: Lidocaine 4% Topical Solution N/A N/A Tissue Debrided: Fibrin/Slough, Subcutaneous N/A N/A Level: Skin/Subcutaneous Tissue N/A N/A Debridement Area (sq cm): 0.6 N/A N/A Instrument: Curette N/A N/A Bleeding: Minimum N/A N/A Hemostasis Achieved: Pressure N/A N/A Procedural Pain: 0 N/A N/A Post Procedural Pain: 0 N/A N/A Debridement Treatment Procedure was tolerated well N/A N/A Response: Post Debridement 1x0.6x1.6 N/A N/A Measurements  L x W x D (cm) Post Debridement Volume: 0.754 N/A N/A (cm) Post Debridement Stage: Category/Stage IV N/A N/A Periwound Skin Texture: Scarring: Yes N/A N/A Excoriation: No Induration: No Callus: No Crepitus: No Rash: No Periwound Skin Moisture: Maceration: No N/A N/A Dry/Scaly: No Periwound Skin Color: Atrophie Blanche: No N/A N/A Cyanosis: No Ecchymosis: No Erythema: No Hemosiderin Staining: No Mottled: No Pallor: No Rubor: No Temperature: No Abnormality N/A N/A Tenderness on Palpation: No N/A N/A Wound Preparation: Ulcer Cleansing: N/A N/A Rinsed/Irrigated with Saline Topical Anesthetic Applied: Other: lidocaine 4% Procedures Performed: Debridement N/A N/A Treatment Notes Electronic Signature(s) Signed: 11/20/2017 4:55:16 PM By: [name] Entered By: [name] on 11/20/2017 13:23:39 [place], [place] ([id number]) -------------------------------------------------------------------------------- [place]  Details Patient Name: [name], [name] Date of Service: 11/20/2017 12:30 PM Medical Record Number: [id number] Patient Account Number: [id number] Date of Birth/Sex: 06/01/1930 (83 y.o. Female) Treating RN: [name] Primary Care Aury Scollard: [name] Other Clinician: Referring Windi Toro: [name] Treating Pascuala Klutts/Extender: [name] in Treatment: 6 Active Inactive ` Abuse / Safety / Falls / Self Care Management Nursing Diagnoses: Potential for falls Goals: Patient will not experience any injury related to falls Date Initiated: 10/09/2017 Target Resolution Date: 01/10/2018 Goal Status: Active Interventions: Assess fall risk on admission and as needed Notes: ` Orientation to the Wound Care Program Nursing Diagnoses: Knowledge deficit related to the wound healing center program Goals: Patient/caregiver will verbalize understanding of the [facility] Program Date Initiated: 10/09/2017 Target Resolution Date: 01/10/2018 Goal Status: Active Interventions: Provide education on orientation to the wound center Notes: ` Pressure Nursing Diagnoses: Potential for impaired tissue integrity related to pressure, friction, moisture, and shear Goals: Patient will remain free from development of additional pressure ulcers Date Initiated: 10/09/2017 Target Resolution Date: 01/10/2018 Goal Status: Active Interventions: Dung, Prien Contina L. ([id number]) Assess offloading mechanisms upon admission and as needed Provide education on pressure ulcers Notes: ` Wound/Skin Impairment Nursing Diagnoses: Impaired tissue integrity Goals: Ulcer/skin breakdown will heal within 14 weeks Date Initiated: 10/09/2017 Target Resolution Date: 01/10/2018 Goal Status: Active Interventions: Assess patient/caregiver ability to obtain necessary supplies Assess patient/caregiver ability to perform ulcer/skin care regimen upon admission and as needed Assess ulceration(s) every  visit Notes: Electronic Signature(s) Signed: 11/20/2017 4:38:02 PM By: [name] Entered By: [name] on 11/20/2017 13:03:45 Caswell, Lennix L. ([id number]) -------------------------------------------------------------------------------- Pain Assessment Details Patient Name: [name], [name] Date of Service: 11/20/2017 12:30 PM Medical Record Number: [id number] Patient Account Number: [id number] Date of Birth/Sex: 11/18/1930 (83 y.o. Female) Treating RN: [name] Primary Care Joson Sapp: [name] Other Clinician: Referring Dragon Thrush: [name] Treating Jordin Vicencio/Extender: [name] in Treatment: 6 Active Problems Location of Pain Severity and Description of Pain Patient Has Paino No Site Locations Pain Management and Medication Current Pain Management: Electronic Signature(s) Signed: 11/20/2017 4:38:02 PM By: [name] Entered By: [name] on 11/20/2017 12:46:01 [name], [facility]red. ([id number]) -------------------------------------------------------------------------------- Patient/Caregiver Education Details Patient Name: [name], [name] Date of Service: 11/20/2017 12:30 PM Medical Record Number: [id number] Patient Account Number: [id number] Date of Birth/Gender: Sep 30, 1930 (83 y.o. Female) Treating RN: [name] Primary Care Physician: [name] Other Clinician: Referring Physician: Priscille [name] Treating Physician/Extender: [name] in Treatment: 6 Education Assessment Education Provided To: Patient and Caregiver Education Topics Provided Wound/Skin Impairment: Handouts: Other: wound care as ordered Methods: Demonstration, Explain/Verbal Responses: State content correctly Electronic Signature(s) Signed: 11/20/2017 4:38:02 PM By: [name] Entered By: [name] on 11/20/2017 14:45:18 [name], [name]  ([id number]) -------------------------------------------------------------------------------- Wound  Assessment Details Patient Name: [name], [name] Date of Service: 11/20/2017 12:30 PM Medical Record Number: [id number] Patient Account Number: [id number] Date of Birth/Sex: 08-Mar-1930 (83 y.o. Female) Treating RN: [name] Primary Care Hasana Alcorta: [name] Other Clinician: Referring Cristobal Advani: [name] Treating Valoria Tamburri/Extender: [name] in Treatment: 6 Wound Status Wound Number: 1 Primary Pressure Ulcer Etiology: Wound Location: Sacrum Wound Open Wounding Event: Pressure Injury Status: Date Acquired: 07/14/2017 Comorbid Anemia, Congestive Heart Failure, Weeks Of Treatment: 6 History: Hypertension, Osteoarthritis, Osteomyelitis, Clustered Wound: No Received Chemotherapy, Received Radiation Photos Photo Uploaded By: [name] on 11/20/2017 16:35:52 Wound Measurements Length: (cm) 1 Width: (cm) 0.6 Depth: (cm) 1.5 Area: (cm) 0.471 Volume: (cm) 0.707 % Reduction in Area: 58.4% % Reduction in Volume: 51.9% Epithelialization: None Tunneling: No Undermining: Yes Starting Position (o'clock): 12 Ending Position (o'clock): 12 Maximum Distance: (cm) 1.5 Wound Description Classification: Category/Stage IV Wound Margin: Flat and Intact Exudate Amount: Large Exudate Type: Serous Exudate Color: amber Foul Odor After Cleansing: No Slough/Fibrino Yes Wound Bed Granulation Amount: Large (67-100%) Exposed Structure Granulation Quality: Red Fascia Exposed: Yes Necrotic Amount: Small (1-33%) Fat Layer (Subcutaneous Tissue) Exposed: Yes Necrotic Quality: Eschar, Adherent Slough Tendon Exposed: Yes Muscle Exposed: No [name], [name] ([id number]) Joint Exposed: No Bone Exposed: No Periwound Skin Texture Texture Color No Abnormalities Noted: No No Abnormalities Noted: No Callus: No Atrophie Blanche: No Crepitus: No Cyanosis: No Excoriation:  No Ecchymosis: No Induration: No Erythema: No Rash: No Hemosiderin Staining: No Scarring: Yes Mottled: No Pallor: No Moisture Rubor: No No Abnormalities Noted: No Dry / Scaly: No Temperature / Pain Maceration: No Temperature: No Abnormality Wound Preparation Ulcer Cleansing: Rinsed/Irrigated with Saline Topical Anesthetic Applied: Other: lidocaine 4%, Treatment Notes Wound #1 (Sacrum) 1. Cleansed with: Clean wound with Normal Saline 2. Anesthetic Topical Lidocaine 4% cream to wound bed prior to debridement 4. Dressing Applied: Santyl Ointment 5. Secondary Dressing Applied Bordered Foam Dressing Dry Gauze Electronic Signature(s) Signed: 11/20/2017 4:38:02 PM By: [name] Entered By: [name] on 11/20/2017 12:52:26 [place], [place] ([id number]) -------------------------------------------------------------------------------- Vitals Details Patient Name: [name], Lauramae L. Date of Service: 11/20/2017 12:30 PM Medical Record Number: [id number] Patient Account Number: [id number] Date of Birth/Sex: May 05, 1930 (83 y.o. Female) Treating RN: [name] Primary Care Jayton Popelka: [name] Other Clinician: Referring Belle Charlie: [name] Treating Jadira Nierman/Extender: [name] in Treatment: 6 Vital Signs Time Taken: 12:46 Temperature (F): 98.0 Height (in): 64 Pulse (bpm): 54 Weight (lbs): 200 Respiratory Rate (breaths/min): 16 Body Mass Index (BMI): 34.3 Blood Pressure (mmHg): 163/64 Reference Range: 80 - 120 mg / dl Electronic Signature(s) Signed: 11/20/2017 4:38:02 PM By: [name] Entered By: [name] on 11/20/2017 12:46:26

## 2017-11-27 ENCOUNTER — Encounter: Payer: Medicare Other | Admitting: Surgery

## 2017-11-27 DIAGNOSIS — L89154 Pressure ulcer of sacral region, stage 4: Secondary | ICD-10-CM | POA: Diagnosis not present

## 2017-11-29 NOTE — Progress Notes (Signed)
[name] ([id number]) Visit Report for 11/27/2017 Chief Complaint Document Details Patient Name: [name], [name] Date of Service: 11/27/2017 2:30 PM Medical Record Number: [id number] Patient Account Number: [id number] Date of Birth/Sex: [birth year] (83 y.o. Female) Treating RN: [name] Primary Care Provider: Priscille [name] Other Clinician: Referring Provider: Priscille [name] Treating Provider/Extender: [name] in Treatment: 7 Information Obtained from: Patient Chief Complaint she is here in follow-up evaluation of a sacral ulcer Electronic Signature(s) Signed: 11/27/2017 4:18:44 PM By: [name] Fudge MD, FACS Entered By: [name] on 11/27/2017 16:18:44 [name], [name] ([id number]) -------------------------------------------------------------------------------- HPI Details Patient Name: [name], [name] Date of Service: 11/27/2017 2:30 PM Medical Record Number: [id number] Patient Account Number: [id number] Date of Birth/Sex: 02-25-1930 (83 y.o. Female) Treating RN: [name] Primary Care Provider: Priscille [name] Other Clinician: Referring Provider: Priscille [name] Treating Provider/Extender: [name] in Treatment: 7 History of Present Illness Location: sacral pressure ulcer Quality: Patient reports No Pain. Severity: Patient states wound (s) are getting better. Duration: Patient has had the wound for > 3 months prior to seeking treatment at the wound center Context: The wound would happen gradually Modifying Factors: Other treatment(s) tried include:surgical debridement, IV antibiotics and occupational therapy and physical therapy Associated Signs and Symptoms: Patient reports having:appropriate treatments and home health HPI Description: 83 year old patient who has been referred to [place] for the sacral decubitus ulcer was admitted to the [facility] on 07/18/2017 for an acute sacral decubitus with osteomyelitis. She was treated  there with IV antibiotics and surgical debridement was done on 08/06/2017 and transferred back to a swing bed 08/12/2017. She has completed antibiotics on 09/18/2017 and has been now living at home. Past medical history of GERD, fracture of right humerus, hypertension, rectal cancer, status post colostomy, laparoscopic cholecystectomy, debridement of sacral decubitus ulcer including subcutaneous tissue muscle and bone done on 08/06/2017, previous rectal cancer surgery in April 2017. Reviewing notes from the [facility] it was noted that she was treated for a MRSA sacral osteomyelitis and Proteus bacteremia requiring IV antibiotics which included clindamycin and vancomycin. Vancomycin had to be stopped on September 25 due to neutropenia and thrombocytopenia and changed to daptomycin until 09/20/2017. Infectious disease then advised to switch to clindamycin and the PICC line was removed on 09/22/2017. review of the electronic medical records noted that cultures of bone and tissue taken from the OR for wound debridement on 95 grew MRSA and she was started on IV antibiotics initially clindamycin and switched to IV vancomycin and the Proteus bacteremia was stained treated with by mouth levofloxacin and Flagyl for 6 weeks to end on 09/03/2017. She was also advised an offloading mattress and would follow-up with supportive care 10/17/2017 -- the patient is ambulating a bit with the help of OT and PT and she is also eating much better. She has got inappropriate air mattress in place. 11/06/2017 -- the patient is a bit more ambivalent and continues to work with her mobilization. She is not yet ready for a wound VAC. 11/20/17 she is here in follow-up evaluation of a sacral ulcer. Wound VAC has not been improved, will continue with Santyl and anticipate that she can transfer to wound VAC at next appointment, bone fragments present in wound, no indication of infection. 11/27/2017 -- the patient has  still not received her wound VAC and has been unable to explain to others by the home health nurse has not got it. Our nurses will look into this today Electronic Signature(s) Signed: 11/27/2017 4:19:23 PM  By: [name] Fudge MD, FACS Entered By: [name] on 11/27/2017 16:19:23 [name] ([id number]) -------------------------------------------------------------------------------- Physical Exam Details Patient Name: [name], [name] Date of Service: 11/27/2017 2:30 PM Medical Record Number: [id number] Patient Account Number: [id number] Date of Birth/Sex: 06-02-1930 (83 y.o. Female) Treating RN: [name] Primary Care Provider: Priscille [name] Other Clinician: Referring Provider: Priscille [name] Treating Provider/Extender: [name] in Treatment: 7 Constitutional . Pulse regular. Respirations normal and unlabored. Afebrile. . Eyes Nonicteric. Reactive to light. Ears, Nose, Mouth, and Throat Lips, teeth, and gums WNL.[name] Moist mucosa without lesions. Neck supple and nontender. No palpable supraclavicular or cervical adenopathy. Normal sized without goiter. Respiratory WNL. No retractions.. Cardiovascular Pedal Pulses WNL. No clubbing, cyanosis or edema. Lymphatic No adneopathy. No adenopathy. No adenopathy. Musculoskeletal Adexa without tenderness or enlargement.. Digits and nails w/o clubbing, cyanosis, infection, petechiae, ischemia, or inflammatory conditions.. Integumentary (Hair, Skin) No suspicious lesions. No crepitus or fluctuance. No peri-wound warmth or erythema. No masses.[name] Psychiatric Judgement and insight Intact.. No evidence of depression, anxiety, or agitation.. Notes the patient's sacral decubitus ulcer has undermining both towards the 4:00 and the 7:00 position and there is minimal bone palpable towards the 3:00 position. The wound is fairly clean and no sharp debridement was required today. Electronic Signature(s) Signed: 11/27/2017 4:20:47  PM By: [name] Fudge MD, FACS Entered By: [name] on 11/27/2017 16:20:47 [name], [place] [name] ([id number]) -------------------------------------------------------------------------------- Physician Orders Details Patient Name: [name], [name] Date of Service: 11/27/2017 2:30 PM Medical Record Number: [id number] Patient Account Number: [id number] Date of Birth/Sex: 09/09/1930 (83 y.o. Female) Treating RN: [name] Primary Care Provider: Priscille [name] Other Clinician: Referring Provider: Priscille [name] Treating Provider/Extender: [name] in Treatment: 7 Verbal / Phone Orders: No Diagnosis Coding Wound Cleansing Wound #1 Sacrum o Clean wound with Normal Saline. o Cleanse wound with mild soap and water o May Shower, gently pat wound dry prior to applying new dressing. Anesthetic (add to Medication List) Wound #1 Sacrum o Topical Lidocaine 4% cream applied to wound bed prior to debridement (In Clinic Only). Skin Barriers/Peri-Wound Care Wound #1 Sacrum o Skin Prep Primary Wound Dressing Wound #1 Sacrum o Santyl Ointment Secondary Dressing Wound #1 Sacrum o Dry Gauze - pack lightly with gauze o Boardered Foam Dressing Dressing Change Frequency Wound #1 Sacrum o Change dressing every day. Follow-up Appointments o Return Appointment in 1 week. Off-Loading Wound #1 Sacrum o Mattress - Continue air mattress o Turn and reposition every 2 hours Additional Orders / Instructions Wound #1 Sacrum o Increase protein intake. o Activity as tolerated o Other: - Please add vitamin A, vitamin C, multivitamin and zinc supplements to your diet - available over the counter Burzynski, [place] L. ([id number]) [place] #1 [place] Visits - [facility]- HHRN to initiate NPWT once available and change dressing 3 times weekly o [place] Nurse may visit PRN to address patientos wound care needs. o FACE TO FACE  ENCOUNTER: MEDICARE and MEDICAID PATIENTS: I certify that this patient is under my care and that I had a face-to-face encounter that meets the physician face-to-face encounter requirements with this patient on this date. The encounter with the patient was in whole or in part for the following MEDICAL CONDITION: (primary reason for [name]) MEDICAL NECESSITY: I certify, that based on my findings, NURSING services are a medically necessary home health service. HOME BOUND STATUS: I certify that my clinical findings support that this patient is homebound (i.e., Due to illness or  injury, pt requires aid of supportive devices such as crutches, cane, wheelchairs, walkers, the use of special transportation or the assistance of another person to leave their place of residence. There is a normal inability to leave the home and doing so requires considerable and taxing effort. Other absences are for medical reasons / religious services and are infrequent or of short duration when for other reasons). o If current dressing causes regression in wound condition, may D/C ordered dressing product/s and apply Normal Saline Moist Dressing daily until next [place] / Other MD appointment. [place] of regression in wound condition at [phone]. o Please direct any NON-WOUND related issues/requests for orders to patient's Primary Care Physician Negative Pressure Wound Therapy Wound #1 Sacrum o Wound VAC settings at 125/130 mmHg continuous pressure. Use BLACK/GREEN foam to wound cavity. Use WHITE foam to fill any tunnel/s and/or undermining. Change VAC dressing 3 X WEEK. Change canister as indicated when full. Nurse may titrate settings and frequency of dressing changes as clinically indicated. - HHRN to initiate NPWT once available and change dressing 3 times weekly Medications-please add to medication list. Wound #1 Sacrum o Santyl Enzymatic Ointment Electronic  Signature(s) Signed: 11/27/2017 5:09:21 PM By: [name] Fudge MD, FACS Entered By: [name] on 11/27/2017 [place], [place] L. ([id number]) -------------------------------------------------------------------------------- Problem List Details Patient Name: [place], [place] L. Date of Service: 11/27/2017 2:30 PM Medical Record Number: [id number] Patient Account Number: [id number] Date of Birth/Sex: 31-Jan-1930 (83 y.o. Female) Treating RN: [name] Primary Care Provider: Priscille [name] Other Clinician: Referring Provider: Priscille [name] Treating Provider/Extender: [name] in Treatment: 7 Active Problems ICD-10 Encounter Code Description Active Date Diagnosis L89.154 Pressure ulcer of sacral region, stage 4 10/09/2017 Yes E44.0 Moderate protein-calorie malnutrition 10/09/2017 Yes Z99.3 Dependence on wheelchair 10/09/2017 Yes Inactive Problems Resolved Problems Electronic Signature(s) Signed: 11/27/2017 4:18:32 PM By: [name] Fudge MD, FACS Entered By: [name] on 11/27/2017 16:18:31 [place], [place] L. ([id number]) -------------------------------------------------------------------------------- Progress Note Details Patient Name: [name], [name] Date of Service: 11/27/2017 2:30 PM Medical Record Number: [id number] Patient Account Number: [id number] Date of Birth/Sex: 1930-05-02 (83 y.o. Female) Treating RN: [name] Primary Care Provider: Priscille [name] Other Clinician: Referring Provider: Priscille [name] Treating Provider/Extender: [name] in Treatment: 7 Subjective Chief Complaint Information obtained from Patient she is here in follow-up evaluation of a sacral ulcer History of Present Illness (HPI) The following HPI elements were documented for the patient's wound: Location: sacral pressure ulcer Quality: Patient reports No Pain. Severity: Patient states wound (s) are getting better. Duration: Patient has had the wound for > 3  months prior to seeking treatment at the wound center Context: The wound would happen gradually Modifying Factors: Other treatment(s) tried include:surgical debridement, IV antibiotics and occupational therapy and physical therapy Associated Signs and Symptoms: Patient reports having:appropriate treatments and home health 83 year old patient who has been referred to [place] for the sacral decubitus ulcer was admitted to the [facility] on 07/18/2017 for an acute sacral decubitus with osteomyelitis. She was treated there with IV antibiotics and surgical debridement was done on 08/06/2017 and transferred back to a swing bed 08/12/2017. She has completed antibiotics on 09/18/2017 and has been now living at home. Past medical history of GERD, fracture of right humerus, hypertension, rectal cancer, status post colostomy, laparoscopic cholecystectomy, debridement of sacral decubitus ulcer including subcutaneous tissue muscle and bone done on 08/06/2017, previous rectal cancer surgery in April 2017. Reviewing notes from the [facility] it was noted that she  was treated for a MRSA sacral osteomyelitis and Proteus bacteremia requiring IV antibiotics which included clindamycin and vancomycin. Vancomycin had to be stopped on September 25 due to neutropenia and thrombocytopenia and changed to daptomycin until 09/20/2017. Infectious disease then advised to switch to clindamycin and the PICC line was removed on 09/22/2017. review of the electronic medical records noted that cultures of bone and tissue taken from the OR for wound debridement on 95 grew MRSA and she was started on IV antibiotics initially clindamycin and switched to IV vancomycin and the Proteus bacteremia was stained treated with by mouth levofloxacin and Flagyl for 6 weeks to end on 09/03/2017. She was also advised an offloading mattress and would follow-up with supportive care 10/17/2017 -- the patient is ambulating a bit with the help  of OT and PT and she is also eating much better. She has got inappropriate air mattress in place. 11/06/2017 -- the patient is a bit more ambivalent and continues to work with her mobilization. She is not yet ready for a wound VAC. 11/20/17 she is here in follow-up evaluation of a sacral ulcer. Wound VAC has not been improved, will continue with Santyl and anticipate that she can transfer to wound VAC at next appointment, bone fragments present in wound, no indication of infection. 11/27/2017 -- the patient has still not received her wound VAC and has been unable to explain to others by the home health nurse has not got it. Our nurses will look into this today Patient History Information obtained from Patient. [name], [name] Adilenne L. ([id number]) Family History Cancer - Child, No family history of Diabetes, Heart Disease, Hereditary Spherocytosis, Hypertension, Kidney Disease, Lung Disease, Seizures, Stroke, Thyroid Problems, Tuberculosis. Social History Never smoker, Marital Status - Married, Alcohol Use - Never, Drug Use - No History, Caffeine Use - Daily. Medical And Surgical History Notes Gastrointestinal colostomy since 1990 Oncologic colorectal - 1990 Objective Constitutional Pulse regular. Respirations normal and unlabored. Afebrile. Vitals Time Taken: 2:55 AM, Height: 64 in, Weight: 200 lbs, BMI: 34.3, Temperature: 97.7 F, Pulse: 56 bpm, Respiratory Rate: 16 breaths/min, Blood Pressure: 164/88 mmHg. Eyes Nonicteric. Reactive to light. Ears, Nose, Mouth, and Throat Lips, teeth, and gums WNL.[name] Moist mucosa without lesions. Neck supple and nontender. No palpable supraclavicular or cervical adenopathy. Normal sized without goiter. Respiratory WNL. No retractions.. Cardiovascular Pedal Pulses WNL. No clubbing, cyanosis or edema. Lymphatic No adneopathy. No adenopathy. No adenopathy. Musculoskeletal Adexa without tenderness or enlargement.. Digits and nails w/o clubbing,  cyanosis, infection, petechiae, ischemia, or inflammatory conditions.[name] Psychiatric Judgement and insight Intact.. No evidence of depression, anxiety, or agitation.[name], [place] ([id number]) General Notes: the patient's sacral decubitus ulcer has undermining both towards the 4:00 and the 7:00 position and there is minimal bone palpable towards the 3:00 position. The wound is fairly clean and no sharp debridement was required today. Integumentary (Hair, Skin) No suspicious lesions. No crepitus or fluctuance. No peri-wound warmth or erythema. No masses.. Wound #1 status is Open. Original cause of wound was Pressure Injury. The wound is located on the Sacrum. The wound measures 2.2cm length x 1.6cm width x 1.6cm depth; 2.765cm^2 area and 4.423cm^3 volume. There is tendon, Fat Layer (Subcutaneous Tissue) Exposed, and fascia exposed. There is undermining starting at 12:00 and ending at 12:00 with a maximum distance of 2.3cm. There is a large amount of serous drainage noted. The wound margin is flat and intact. There is medium (34-66%) red granulation within the wound bed. There is a medium (34-66%) amount of  necrotic tissue within the wound bed including Eschar and Adherent Slough. The periwound skin appearance exhibited: Scarring. The periwound skin appearance did not exhibit: Callus, Crepitus, Excoriation, Induration, Rash, Dry/Scaly, Maceration, Atrophie Blanche, Cyanosis, Ecchymosis, Hemosiderin Staining, Mottled, Pallor, Rubor, Erythema. Periwound temperature was noted as No Abnormality. Assessment Active Problems ICD-10 L89.154 - Pressure ulcer of sacral region, stage 4 E44.0 - Moderate protein-calorie malnutrition Z99.3 - Dependence on wheelchair Plan Wound Cleansing: Wound #1 Sacrum: Clean wound with Normal Saline. Cleanse wound with mild soap and water May Shower, gently pat wound dry prior to applying new dressing. Anesthetic (add to Medication List): Wound #1  Sacrum: Topical Lidocaine 4% cream applied to wound bed prior to debridement (In Clinic Only). Skin Barriers/Peri-Wound Care: Wound #1 Sacrum: Skin Prep Primary Wound Dressing: Wound #1 Sacrum: Santyl Ointment Secondary Dressing: Wound #1 Sacrum: Dry Gauze - pack lightly with gauze Boardered Foam Dressing Dressing Change Frequency: Wound #1 Sacrum: Change dressing every day. Follow-up Appointments: [name], [name] ([id number]) Return Appointment in 1 week. Off-Loading: Wound #1 Sacrum: Mattress - Continue air mattress Turn and reposition every 2 hours Additional Orders / Instructions: Wound #1 Sacrum: Increase protein intake. Activity as tolerated Other: - Please add vitamin A, vitamin C, multivitamin and zinc supplements to your diet - available over the counter Home Health: Wound #1 Sacrum: [place] Visits - [facility]- HHRN to initiate NPWT once available and change dressing 3 times weekly Home Health Nurse may visit PRN to address patient s wound care needs. FACE TO FACE ENCOUNTER: MEDICARE and MEDICAID PATIENTS: I certify that this patient is under my care and that I had a face-to-face encounter that meets the physician face-to-face encounter requirements with this patient on this date. The encounter with the patient was in whole or in part for the following MEDICAL CONDITION: (primary reason for [place]) MEDICAL NECESSITY: I certify, that based on my findings, NURSING services are a medically necessary home health service. HOME BOUND STATUS: I certify that my clinical findings support that this patient is homebound (i.e., Due to illness or injury, pt requires aid of supportive devices such as crutches, cane, wheelchairs, walkers, the use of special transportation or the assistance of another person to leave their place of residence. There is a normal inability to leave the home and doing so requires considerable and taxing effort. Other absences are for  medical reasons / religious services and are infrequent or of short duration when for other reasons). If current dressing causes regression in wound condition, may D/C ordered dressing product/s and apply Normal Saline Moist Dressing daily until next [place] / Other MD appointment. [place]  of regression in wound condition at [phone]. Please direct any NON-WOUND related issues/requests for orders to patient's Primary Care Physician Negative Pressure Wound Therapy: Wound #1 Sacrum: Wound VAC settings at 125/130 mmHg continuous pressure. Use BLACK/GREEN foam to wound cavity. Use WHITE foam to fill any tunnel/s and/or undermining. Change VAC dressing 3 X WEEK. Change canister as indicated when full. Nurse may titrate settings and frequency of dressing changes as clinically indicated. - HHRN to initiate NPWT once available and change dressing 3 times weekly Medications-please add to medication list.: Wound #1 Sacrum: Santyl Enzymatic Ointment for some unknown reason she has not yet got a wound VAC which has been ordered 2 weeks ago. After review today I have recommended: 1. Until she gets a wound VAC, Santyl ointment with local packing to be changed daily over the next week and  covered with a bordered foam. 2. offloading has been discussed in great detail and we have discussed frequent mobilization and working with OT and PT 3. Adequate protein, vitamin A, vitamin C and zinc 4. Regular visits the wound center Electronic Signature(s) Signed: 11/27/2017 4:22:21 PM By: [name] Fudge MD, FACS Entered By: [name] on 11/27/2017 16:22:20 [name], [name] ([id number]) -------------------------------------------------------------------------------- ROS/PFSH Details Patient Name: [name], [name] Date of Service: 11/27/2017 2:30 PM Medical Record Number: [id number] Patient Account Number: [id number] Date of Birth/Sex: September 25, 1930 (83 y.o. Female) Treating  RN: [name] Primary Care Provider: Priscille [name] Other Clinician: Referring Provider: Priscille [name] Treating Provider/Extender: [name] in Treatment: 7 Information Obtained From Patient Wound History Do you currently have one or more open woundso Yes How many open wounds do you currently haveo 1 Approximately how long have you had your woundso 3 months How have you been treating your wound(s) until nowo santyl Has your wound(s) ever healed and then re-openedo No Have you had any lab work done in the past montho Yes Who ordered the lab work [facility] Have you tested positive for an antibiotic resistant organism (MRSA, VRE)o No Have you tested positive for osteomyelitis (bone infection)o Yes Have you had any tests for circulation on your legso No Hematologic/Lymphatic Medical History: Positive for: Anemia Negative for: Hemophilia; Human Immunodeficiency Virus; Lymphedema; Sickle Cell Disease Respiratory Medical History: Negative for: Aspiration; Asthma; Chronic Obstructive Pulmonary Disease (COPD); Pneumothorax; Sleep Apnea; Tuberculosis Cardiovascular Medical History: Positive for: Congestive Heart Failure; Hypertension Negative for: Angina; Arrhythmia; Coronary Artery Disease; Deep Vein Thrombosis; Hypotension; Myocardial Infarction; Peripheral Arterial Disease; Peripheral Venous Disease Gastrointestinal Medical History: Negative for: Cirrhosis ; Colitis; Crohnos; Hepatitis A; Hepatitis B Past Medical History Notes: colostomy since 1990 Immunological Medical History: Negative for: Lupus Erythematosus; Raynaudos; Scleroderma Musculoskeletal Medical History: Positive for: Osteoarthritis; Osteomyelitis [name], [name] ([id number]) Negative for: Gout; Rheumatoid Arthritis Neurologic Medical History: Negative for: Dementia; Neuropathy; Quadriplegia Oncologic Medical History: Positive for: Received Chemotherapy; Received Radiation Past  Medical History Notes: colorectal - 1990 Immunizations Pneumococcal Vaccine: Received Pneumococcal Vaccination: Yes Immunization Notes: up to date Implantable Devices Family and Social History Cancer: Yes - Child; Diabetes: No; Heart Disease: No; Hereditary Spherocytosis: No; Hypertension: No; Kidney Disease: No; Lung Disease: No; Seizures: No; Stroke: No; Thyroid Problems: No; Tuberculosis: No; Never smoker; Marital Status - Married; Alcohol Use: Never; Drug Use: No History; Caffeine Use: Daily; Financial Concerns: No; Food, Clothing or [name] Needs: No; Support System Lacking: No; Transportation Concerns: No; Advanced Directives: Yes (Not Provided); Patient does not want information on Advanced Directives; Living Will: Yes (Not Provided); Medical Power of Attorney: Yes - son - Aleshka Corney (Not Provided) Physician Affirmation I have reviewed and agree with the above information. Electronic Signature(s) Signed: 11/27/2017 5:09:21 PM By: [name] Fudge MD, FACS Signed: 11/28/2017 2:36:38 PM By: [name] Entered By: [name] on 11/27/2017 16:19:33 [name], [name] ([id number]) -------------------------------------------------------------------------------- SuperBill Details Patient Name: [name], [name] Date of Service: 11/27/2017 Medical Record Number: [id number] Patient Account Number: [id number] Date of Birth/Sex: [birth year] (83 y.o. Female) Treating RN: [name] Primary Care Provider: Priscille [name] Other Clinician: Referring Provider: Priscille [name] Treating Provider/Extender: [name] in Treatment: 7 Diagnosis Coding ICD-10 Codes Code Description L89.154 Pressure ulcer of sacral region, stage 4 E44.0 Moderate protein-calorie malnutrition Z99.3 Dependence on wheelchair Facility Procedures CPT4 Code: 54008676 Description: [place] VISIT-LEV 3 EST PT Modifier: Quantity: 1 Physician Procedures CPT4 Code: 1950932 Description:  67124 - WC  PHYS LEVEL 3 - EST PT ICD-10 Diagnosis Description L89.154 Pressure ulcer of sacral region, stage 4 E44.0 Moderate protein-calorie malnutrition Z99.3 Dependence on wheelchair Modifier: Quantity: 1 Electronic Signature(s) Signed: 11/27/2017 4:22:41 PM By: [name] Fudge MD, FACS Entered By: [name] on 11/27/2017 16:22:40

## 2017-11-29 NOTE — Progress Notes (Addendum)
MARGOT, ORIORDAN ([id number]) Visit Report for 11/27/2017 Arrival Information Details Patient Name: [name], [name] Date of Service: 11/27/2017 2:30 PM Medical Record Number: [id number] Patient Account Number: [id number] Date of Birth/Sex: 1930-01-11 (83 y.o. Female) Treating RN: Roger Shelter Primary Care Jaquavian Firkus: Priscille Kluver Other Clinician: Referring Lux Skilton: Priscille Kluver Treating Genise Strack/Extender: Frann Rider in Treatment: 7 Visit Information History Since Last Visit All ordered tests and consults were completed: No Patient Arrived: Wheel Chair Added or deleted any medications: No Arrival Time: 14:54 Any new allergies or adverse reactions: No Accompanied By: daughter Had a fall or experienced change in No activities of daily living that may affect Transfer Assistance: Harrel Lemon Lift risk of falls: Patient Identification Verified: Yes Signs or symptoms of abuse/neglect since last visito No Secondary Verification Process Completed: Yes Hospitalized since last visit: No Patient Requires Transmission-Based No Pain Present Now: No Precautions: Patient Has Alerts: No Electronic Signature(s) Signed: 11/28/2017 2:36:38 PM By: Roger Shelter Entered By: Roger Shelter on 11/27/2017 14:55:31 [place], [place]. ([id number]) -------------------------------------------------------------------------------- Clinic Level of Care Assessment Details Patient Name: [name], [name] Date of Service: 11/27/2017 2:30 PM Medical Record Number: [id number] Patient Account Number: [id number] Date of Birth/Sex: 08-Feb-1930 (82 y.o. Female) Treating RN: Roger Shelter Primary Care Jiovany Scheffel: Priscille Kluver Other Clinician: Referring Kase Shughart: Priscille Kluver Treating Smiley Birr/Extender: Frann Rider in Treatment: 7 Clinic Level of Care Assessment Items TOOL 4 Quantity Score []  - Use when only an EandM is performed on FOLLOW-UP visit 0 ASSESSMENTS - Nursing Assessment /  Reassessment X - Reassessment of Co-morbidities (includes updates in patient status) 1 10 X- 1 5 Reassessment of Adherence to Treatment Plan ASSESSMENTS - Wound and Skin Assessment / Reassessment X - Simple Wound Assessment / Reassessment - one wound 1 5 []  - 0 Complex Wound Assessment / Reassessment - multiple wounds []  - 0 Dermatologic / Skin Assessment (not related to wound area) ASSESSMENTS - Focused Assessment []  - Circumferential Edema Measurements - multi extremities 0 []  - 0 Nutritional Assessment / Counseling / Intervention []  - 0 Lower Extremity Assessment (monofilament, tuning fork, pulses) []  - 0 Peripheral Arterial Disease Assessment (using hand held doppler) ASSESSMENTS - Ostomy and/or Continence Assessment and Care []  - Incontinence Assessment and Management 0 []  - 0 Ostomy Care Assessment and Management (repouching, etc.) PROCESS - Coordination of Care X - Simple Patient / Family Education for ongoing care 1 15 []  - 0 Complex (extensive) Patient / Family Education for ongoing care []  - 0 Staff obtains Programmer, systems, Records, Test Results / Process Orders []  - 0 Staff telephones HHA, Nursing Homes / Clarify orders / etc []  - 0 Routine Transfer to another Facility (non-emergent condition) []  - 0 Routine Hospital Admission (non-emergent condition) []  - 0 New Admissions / Biomedical engineer / Ordering NPWT, Apligraf, etc. []  - 0 Emergency Hospital Admission (emergent condition) X- 1 10 Simple Discharge Coordination [name], [name] ([id number]) []  - 0 Complex (extensive) Discharge Coordination PROCESS - Special Needs []  - Pediatric / Minor Patient Management 0 []  - 0 Isolation Patient Management []  - 0 Hearing / Language / Visual special needs []  - 0 Assessment of Community assistance (transportation, D/C planning, etc.) []  - 0 Additional assistance / Altered mentation []  - 0 Support Surface(s) Assessment (bed, cushion, seat, etc.) INTERVENTIONS -  Wound Cleansing / Measurement X - Simple Wound Cleansing - one wound 1 5 []  - 0 Complex Wound Cleansing - multiple wounds X- 1 5 Wound Imaging (photographs - any number of wounds) []  - 0  Wound Tracing (instead of photographs) X- 1 5 Simple Wound Measurement - one wound []  - 0 Complex Wound Measurement - multiple wounds INTERVENTIONS - Wound Dressings X - Small Wound Dressing one or multiple wounds 1 10 []  - 0 Medium Wound Dressing one or multiple wounds []  - 0 Large Wound Dressing one or multiple wounds []  - 0 Application of Medications - topical []  - 0 Application of Medications - injection INTERVENTIONS - Miscellaneous []  - External ear exam 0 []  - 0 Specimen Collection (cultures, biopsies, blood, body fluids, etc.) []  - 0 Specimen(s) / Culture(s) sent or taken to Lab for analysis X- 1 10 Patient Transfer (multiple staff / Civil Service fast streamer / Similar devices) []  - 0 Simple Staple / Suture removal (25 or less) []  - 0 Complex Staple / Suture removal (26 or more) []  - 0 Hypo / Hyperglycemic Management (close monitor of Blood Glucose) []  - 0 Ankle / Brachial Index (ABI) - do not check if billed separately X- 1 5 Vital Signs [name], [name]. ([id number]) Has the patient been seen at the hospital within the last three years: Yes Total Score: 85 Level Of Care: New/Established - Level 3 Electronic Signature(s) Signed: 11/28/2017 2:36:38 PM By: Roger Shelter Entered By: Roger Shelter on 11/27/2017 15:22:55 [place], Pratt ([id number]) -------------------------------------------------------------------------------- Encounter Discharge Information Details Patient Name: [name], [name] Date of Service: 11/27/2017 2:30 PM Medical Record Number: [id number] Patient Account Number: [id number] Date of Birth/Sex: [birth year] (83 y.o. Female) Treating RN: Roger Shelter Primary Care Evette Diclemente: Priscille Kluver Other Clinician: Referring Uzziel Russey: Priscille Kluver Treating  Kairo Laubacher/Extender: Frann Rider in Treatment: 7 Encounter Discharge Information Items Discharge Pain Level: 0 Discharge Condition: Stable Ambulatory Status: Wheelchair Discharge Destination: Home Transportation: Private Auto Accompanied By: caregiver Schedule Follow-up Appointment: Yes Medication Reconciliation completed and Yes provided to Patient/Care Micha Dosanjh: Provided on Clinical Summary of Care: 11/27/2017 Form Type Recipient Paper Patient RA Electronic Signature(s) Signed: 12/04/2017 4:27:31 PM By: Gretta Cool, BSN, RN, CWS, Kim RN, BSN Previous Signature: 11/28/2017 2:37:55 PM Version By: Ruthine Dose Entered By: Gretta Cool BSN, RN, CWS, Kim on 12/04/2017 16:27:31 [place], [place] ([id number]) -------------------------------------------------------------------------------- Lower Extremity Assessment Details Patient Name: [name], [name] Date of Service: 11/27/2017 2:30 PM Medical Record Number: [id number] Patient Account Number: [id number] Date of Birth/Sex: 08/28/1930 (83 y.o. Female) Treating RN: Roger Shelter Primary Care Rhyli Depaula: Priscille Kluver Other Clinician: Referring Marnita Poirier: Priscille Kluver Treating Dandrea Widdowson/Extender: Frann Rider in Treatment: 7 Edema Assessment Assessed: [Left: No] [Right: No] Edema: [Left: Yes] [Right: Yes] Electronic Signature(s) Signed: 11/28/2017 2:36:38 PM By: Roger Shelter Entered By: Roger Shelter on 11/27/2017 15:07:11 [place], [place] ([id number]) -------------------------------------------------------------------------------- Multi Wound Chart Details Patient Name: [name], [name] Date of Service: 11/27/2017 2:30 PM Medical Record Number: [id number] Patient Account Number: [id number] Date of Birth/Sex: 12-15-1929 (83 y.o. Female) Treating RN: Roger Shelter Primary Care Katelan Hirt: Priscille Kluver Other Clinician: Referring Maddeline Roorda: Priscille Kluver Treating Shenna Brissette/Extender: Frann Rider in Treatment:  7 Vital Signs Height(in): 64 Pulse(bpm): 62 Weight(lbs): 200 Blood Pressure(mmHg): 164/88 Body Mass Index(BMI): 34 Temperature(F): 97.7 Respiratory Rate 16 (breaths/min): Photos: [1:No Photos] [N/A:N/A] Wound Location: [1:Sacrum] [N/A:N/A] Wounding Event: [1:Pressure Injury] [N/A:N/A] Primary Etiology: [1:Pressure Ulcer] [N/A:N/A] Comorbid History: [1:Anemia, Congestive Heart Failure, Hypertension, Osteoarthritis, Osteomyelitis, Received Chemotherapy, Received Radiation] [N/A:N/A] Date Acquired: [1:07/14/2017] [N/A:N/A] Weeks of Treatment: [1:7] [N/A:N/A] Wound Status: [1:Open] [N/A:N/A] Measurements L x W x D [1:2.2x1.6x1.6] [N/A:N/A] (cm) Area (cm) : [1:2.765] [N/A:N/A] Volume (cm) : [1:4.423] [N/A:N/A] % Reduction in Area: [1:-144.50%] [N/A:N/A] % Reduction in Volume: [1:-200.90%] [  N/A:N/A] Starting Position 1 [1:12] (o'clock): Ending Position 1 [1:12] (o'clock): Maximum Distance 1 (cm): [1:2.3] Undermining: [1:Yes] [N/A:N/A] Classification: [1:Category/Stage IV] [N/A:N/A] Exudate Amount: [1:Large] [N/A:N/A] Exudate Type: [1:Serous] [N/A:N/A] Exudate Color: [1:amber] [N/A:N/A] Wound Margin: [1:Flat and Intact] [N/A:N/A] Granulation Amount: [1:Medium (34-66%)] [N/A:N/A] Granulation Quality: [1:Red] [N/A:N/A] Necrotic Amount: [1:Medium (34-66%)] [N/A:N/A] Necrotic Tissue: [1:Eschar, Adherent Slough] [N/A:N/A] Exposed Structures: [1:Fascia: Yes Fat Layer (Subcutaneous Tissue) Exposed: Yes Tendon: Yes] [N/A:N/A] Muscle: No Joint: No Bone: No Epithelialization: None N/A N/A Periwound Skin Texture: Scarring: Yes N/A N/A Excoriation: No Induration: No Callus: No Crepitus: No Rash: No Periwound Skin Moisture: Maceration: No N/A N/A Dry/Scaly: No Periwound Skin Color: Atrophie Blanche: No N/A N/A Cyanosis: No Ecchymosis: No Erythema: No Hemosiderin Staining: No Mottled: No Pallor: No Rubor: No Temperature: No Abnormality N/A N/A Tenderness on  Palpation: No N/A N/A Wound Preparation: Ulcer Cleansing: N/A N/A Rinsed/Irrigated with Saline Topical Anesthetic Applied: Other: lidocaine 4% Treatment Notes Electronic Signature(s) Signed: 11/27/2017 4:18:37 PM By: Christin Fudge MD, FACS Entered By: Christin Fudge on 11/27/2017 16:18:37 [place], [place] L. ([id number]) -------------------------------------------------------------------------------- [place] Details Patient Name: [name], [name] Date of Service: 11/27/2017 2:30 PM Medical Record Number: [id number] Patient Account Number: [id number] Date of Birth/Sex: June 29, 1930 (83 y.o. Female) Treating RN: Roger Shelter Primary Care Jessilyn Catino: Priscille Kluver Other Clinician: Referring Timoty Bourke: Priscille Kluver Treating Enya Bureau/Extender: Frann Rider in Treatment: 7 Active Inactive ` Abuse / Safety / Falls / Self Care Management Nursing Diagnoses: Potential for falls Goals: Patient will not experience any injury related to falls Date Initiated: 10/09/2017 Target Resolution Date: 01/10/2018 Goal Status: Active Interventions: Assess fall risk on admission and as needed Notes: ` Orientation to the Wound Care Program Nursing Diagnoses: Knowledge deficit related to the wound healing center program Goals: Patient/caregiver will verbalize understanding of the [place] Program Date Initiated: 10/09/2017 Target Resolution Date: 01/10/2018 Goal Status: Active Interventions: Provide education on orientation to the wound center Notes: ` Pressure Nursing Diagnoses: Potential for impaired tissue integrity related to pressure, friction, moisture, and shear Goals: Patient will remain free from development of additional pressure ulcers Date Initiated: 10/09/2017 Target Resolution Date: 01/10/2018 Goal Status: Active Interventions: Ahley, Bulls Sanyiah L. ([id number]) Assess offloading mechanisms upon admission and as needed Provide education on pressure  ulcers Notes: ` Wound/Skin Impairment Nursing Diagnoses: Impaired tissue integrity Goals: Ulcer/skin breakdown will heal within 14 weeks Date Initiated: 10/09/2017 Target Resolution Date: 01/10/2018 Goal Status: Active Interventions: Assess patient/caregiver ability to obtain necessary supplies Assess patient/caregiver ability to perform ulcer/skin care regimen upon admission and as needed Assess ulceration(s) every visit Notes: Electronic Signature(s) Signed: 11/28/2017 2:36:38 PM By: Roger Shelter Entered By: Roger Shelter on 11/27/2017 15:07:19 [name], [place] ([id number]) -------------------------------------------------------------------------------- Pain Assessment Details Patient Name: [name], [name] Date of Service: 11/27/2017 2:30 PM Medical Record Number: [id number] Patient Account Number: [id number] Date of Birth/Sex: July 23, 1930 (83 y.o. Female) Treating RN: Roger Shelter Primary Care Alamin Mccuiston: Priscille Kluver Other Clinician: Referring Pheng Prokop: Priscille Kluver Treating Peighton Mehra/Extender: Frann Rider in Treatment: 7 Active Problems Location of Pain Severity and Description of Pain Patient Has Paino No Site Locations Pain Management and Medication Current Pain Management: Electronic Signature(s) Signed: 11/28/2017 2:36:38 PM By: Roger Shelter Entered By: Roger Shelter on 11/27/2017 14:55:39 [place], [place] ([id number]) -------------------------------------------------------------------------------- Patient/Caregiver Education Details Patient Name: [name], [name] Date of Service: 11/27/2017 2:30 PM Medical Record Number: [id number] Patient Account Number: [id number] Date of Birth/Gender: 30-Mar-1930 (83 y.o. Female) Treating RN: Roger Shelter Primary Care Physician: Priscille Kluver Other Clinician:  Referring Physician: Priscille Kluver Treating Physician/Extender: Frann Rider in Treatment: 7 Education Assessment Education  Provided To: Patient Education Topics Provided Wound/Skin Impairment: Handouts: Caring for Your Ulcer Methods: Explain/Verbal Responses: State content correctly Electronic Signature(s) Signed: 11/28/2017 2:36:38 PM By: Roger Shelter Entered By: Roger Shelter on 11/27/2017 15:24:29 [name], [name] ([id number]) -------------------------------------------------------------------------------- Wound Assessment Details Patient Name: [name], [name] Date of Service: 11/27/2017 2:30 PM Medical Record Number: [id number] Patient Account Number: [id number] Date of Birth/Sex: Nov 16, 1930 (83 y.o. Female) Treating RN: Roger Shelter Primary Care Jakyra Kenealy: Priscille Kluver Other Clinician: Referring Railee Bonillas: Priscille Kluver Treating Claudina Oliphant/Extender: Frann Rider in Treatment: 7 Wound Status Wound Number: 1 Primary Pressure Ulcer Etiology: Wound Location: Sacrum Wound Open Wounding Event: Pressure Injury Status: Date Acquired: 07/14/2017 Comorbid Anemia, Congestive Heart Failure, Weeks Of Treatment: 7 History: Hypertension, Osteoarthritis, Osteomyelitis, Clustered Wound: No Received Chemotherapy, Received Radiation Wound Measurements Length: (cm) 2.2 % Reducti Width: (cm) 1.6 % Reducti Depth: (cm) 1.6 Epithelia Area: (cm) 2.765 Undermin Volume: (cm) 4.423 Start Ending Maximu on in Area: -144.5% on in Volume: -200.9% lization: None ing: Yes ing Position (o'clock): 12 Position (o'clock): 12 m Distance: (cm) 2.3 Wound Description Classification: Category/Stage IV Foul Odor Wound Margin: Flat and Intact Slough/Fi Exudate Amount: Large Exudate Type: Serous Exudate Color: amber After Cleansing: No brino Yes Wound Bed Granulation Amount: Medium (34-66%) Exposed Structure Granulation Quality: Red Fascia Exposed: Yes Necrotic Amount: Medium (34-66%) Fat Layer (Subcutaneous Tissue) Exposed: Yes Necrotic Quality: Eschar, Adherent Slough Tendon Exposed:  Yes Muscle Exposed: No Joint Exposed: No Bone Exposed: No Periwound Skin Texture Texture Color No Abnormalities Noted: No No Abnormalities Noted: No Callus: No Atrophie Blanche: No Crepitus: No Cyanosis: No Excoriation: No Ecchymosis: No Induration: No Erythema: No Rash: No Hemosiderin Staining: No Scarring: Yes Mottled: No Pallor: No Moisture Dement, Lilyrose L. ([id number]) No Abnormalities Noted: No Rubor: No Dry / Scaly: No Temperature / Pain Maceration: No Temperature: No Abnormality Wound Preparation Ulcer Cleansing: Rinsed/Irrigated with Saline Topical Anesthetic Applied: Other: lidocaine 4%, Electronic Signature(s) Signed: 11/28/2017 2:36:38 PM By: Roger Shelter Entered By: Roger Shelter on 11/27/2017 15:06:36 [name], [name] [place] L. ([id number]) -------------------------------------------------------------------------------- Vitals Details Patient Name: [name], Paysley L. Date of Service: 11/27/2017 2:30 PM Medical Record Number: [id number] Patient Account Number: [id number] Date of Birth/Sex: Dec 29, 1929 (83 y.o. Female) Treating RN: Roger Shelter Primary Care Yarielys Beed: Priscille Kluver Other Clinician: Referring Jakwon Gayton: Priscille Kluver Treating Nazareth Norenberg/Extender: Frann Rider in Treatment: 7 Vital Signs Time Taken: 02:55 Temperature (F): 97.7 Height (in): 64 Pulse (bpm): 56 Weight (lbs): 200 Respiratory Rate (breaths/min): 16 Body Mass Index (BMI): 34.3 Blood Pressure (mmHg): 164/88 Reference Range: 80 - 120 mg / dl Electronic Signature(s) Signed: 11/28/2017 2:36:38 PM By: Roger Shelter Entered By: Roger Shelter on 11/27/2017 15:00:41

## 2017-12-04 ENCOUNTER — Encounter: Payer: Medicare Other | Attending: Physician Assistant | Admitting: Physician Assistant

## 2017-12-04 DIAGNOSIS — Z7982 Long term (current) use of aspirin: Secondary | ICD-10-CM | POA: Diagnosis not present

## 2017-12-04 DIAGNOSIS — I509 Heart failure, unspecified: Secondary | ICD-10-CM | POA: Insufficient documentation

## 2017-12-04 DIAGNOSIS — Z79899 Other long term (current) drug therapy: Secondary | ICD-10-CM | POA: Diagnosis not present

## 2017-12-04 DIAGNOSIS — M1990 Unspecified osteoarthritis, unspecified site: Secondary | ICD-10-CM | POA: Insufficient documentation

## 2017-12-04 DIAGNOSIS — I110 Hypertensive heart disease with heart failure: Secondary | ICD-10-CM | POA: Insufficient documentation

## 2017-12-04 DIAGNOSIS — Z993 Dependence on wheelchair: Secondary | ICD-10-CM | POA: Insufficient documentation

## 2017-12-04 DIAGNOSIS — E440 Moderate protein-calorie malnutrition: Secondary | ICD-10-CM | POA: Insufficient documentation

## 2017-12-04 DIAGNOSIS — L89154 Pressure ulcer of sacral region, stage 4: Secondary | ICD-10-CM | POA: Diagnosis not present

## 2017-12-05 NOTE — Progress Notes (Signed)
[name], [name] ([id number]) Visit Report for 12/04/2017 Chief Complaint Document Details Patient Name: [name], [name] Date of Service: 12/04/2017 2:30 PM Medical Record Number: [id number] Patient Account Number: [id number] Date of Birth/Sex: July 03, 1930 (84 y.o. Female) Treating RN: Cornell Barman Primary Care Provider: Priscille Kluver Other Clinician: Referring Provider: Priscille Kluver Treating Provider/Extender: Melburn Hake, Larue Drawdy Weeks in Treatment: 8 Information Obtained from: Patient Chief Complaint she is here in follow-up evaluation of a sacral ulcer Electronic Signature(s) Signed: 12/04/2017 6:11:24 PM By: Worthy Keeler PA-C Entered By: Worthy Keeler on 12/04/2017 14:54:54 [place], [place] ([id number]) -------------------------------------------------------------------------------- HPI Details Patient Name: [name], [name] Date of Service: 12/04/2017 2:30 PM Medical Record Number: [id number] Patient Account Number: [id number] Date of Birth/Sex: [birth year] (84 y.o. Female) Treating RN: Cornell Barman Primary Care Provider: Priscille Kluver Other Clinician: Referring Provider: Priscille Kluver Treating Provider/Extender: Melburn Hake, Bama Hanselman Weeks in Treatment: 8 History of Present Illness Location: sacral pressure ulcer Quality: Patient reports No Pain. Severity: Patient states wound (s) are getting better. Duration: Patient has had the wound for > 3 months prior to seeking treatment at the wound center Context: The wound would happen gradually Modifying Factors: Other treatment(s) tried include:surgical debridement, IV antibiotics and occupational therapy and physical therapy Associated Signs and Symptoms: Patient reports having:appropriate treatments and home health HPI Description: 84 year old patient who has been referred to [place] for the sacral decubitus ulcer was admitted to the [facility] on 07/18/2017 for an acute sacral decubitus with osteomyelitis. She was treated there with IV  antibiotics and surgical debridement was done on 08/06/2017 and transferred back to a swing bed 08/12/2017. She has completed antibiotics on 09/18/2017 and has been now living at home. Past medical history of GERD, fracture of right humerus, hypertension, rectal cancer, status post colostomy, laparoscopic cholecystectomy, debridement of sacral decubitus ulcer including subcutaneous tissue muscle and bone done on 08/06/2017, previous rectal cancer surgery in April 2017. Reviewing notes from the [facility] it was noted that she was treated for a MRSA sacral osteomyelitis and Proteus bacteremia requiring IV antibiotics which included clindamycin and vancomycin. Vancomycin had to be stopped on September 25 due to neutropenia and thrombocytopenia and changed to daptomycin until 09/20/2017. Infectious disease then advised to switch to clindamycin and the PICC line was removed on 09/22/2017. review of the electronic medical records noted that cultures of bone and tissue taken from the OR for wound debridement on 95 grew MRSA and she was started on IV antibiotics initially clindamycin and switched to IV vancomycin and the Proteus bacteremia was stained treated with by mouth levofloxacin and Flagyl for 6 weeks to end on 09/03/2017. She was also advised an offloading mattress and would follow-up with supportive care 10/17/2017 -- the patient is ambulating a bit with the help of OT and PT and she is also eating much better. She has got inappropriate air mattress in place. 11/06/2017 -- the patient is a bit more ambivalent and continues to work with her mobilization. She is not yet ready for a wound VAC. 11/20/17 she is here in follow-up evaluation of a sacral ulcer. Wound VAC has not been improved, will continue with Santyl and anticipate that she can transfer to wound VAC at next appointment, bone fragments present in wound, no indication of infection. 11/27/2017 -- the patient has still not  received her wound VAC and has been unable to explain to others by the home health nurse has not got it. Our nurses will look into this today 12/04/17 on evaluation  today patient appears to be doing very well in regard to her sacral wound. We have been using Santyl although the wound appears to be excellent at this point without any evidence of slough noted in the wound bed. No fevers, chills, nausea, or vomiting noted at this time. She has been tolerating the dressing changes without any significant discomfort or pain. Electronic Signature(s) Signed: 12/04/2017 6:11:24 PM By: Worthy Keeler PA-C Entered By: Worthy Keeler on 12/04/2017 15:02:08 [place], [place]. ([id number]) -------------------------------------------------------------------------------- Physical Exam Details Patient Name: [name], [name] Date of Service: 12/04/2017 2:30 PM Medical Record Number: [id number] Patient Account Number: [id number] Date of Birth/Sex: Jul 14, 1930 (84 y.o. Female) Treating RN: Cornell Barman Primary Care Provider: Priscille Kluver Other Clinician: Referring Provider: Priscille Kluver Treating Provider/Extender: STONE III, Shanah Guimaraes Weeks in Treatment: 8 Constitutional Well-nourished and well-hydrated in no acute distress. Respiratory normal breathing without difficulty. clear to auscultation bilaterally. Cardiovascular regular rate and rhythm with normal S1, S2. Psychiatric this patient is able to make decisions and demonstrates good insight into disease process. Alert and Oriented x 3. pleasant and cooperative. Notes And patient has excellent quality to the wound bed in general there is some good granulation she also has some bone exposure in the center of the wound although this is minimal and does not appear to be drying out or brittle. Electronic Signature(s) Signed: 12/04/2017 6:11:24 PM By: Worthy Keeler PA-C Entered By: Worthy Keeler on 12/04/2017 15:03:09 [name], [place] L.  ([id number]) -------------------------------------------------------------------------------- Physician Orders Details Patient Name: [name], [name] Date of Service: 12/04/2017 2:30 PM Medical Record Number: [id number] Patient Account Number: [id number] Date of Birth/Sex: 1930/08/17 (84 y.o. Female) Treating RN: Cornell Barman Primary Care Provider: Priscille Kluver Other Clinician: Referring Provider: Priscille Kluver Treating Provider/Extender: Melburn Hake, Shadow [name] Weeks in Treatment: 8 Verbal / Phone Orders: No Diagnosis Coding ICD-10 Coding Code Description L89.154 Pressure ulcer of sacral region, stage 4 E44.0 Moderate protein-calorie malnutrition Z99.3 Dependence on wheelchair Wound Cleansing Wound #1 Sacrum o Clean wound with Normal Saline. o Cleanse wound with mild soap and water o May Shower, gently pat wound dry prior to applying new dressing. Anesthetic (add to Medication List) Wound #1 Sacrum o Topical Lidocaine 4% cream applied to wound bed prior to debridement (In Clinic Only). Skin Barriers/Peri-Wound Care Wound #1 Sacrum o Skin Prep Primary Wound Dressing Wound #1 Sacrum o Silvercel Non-Adherent Secondary Dressing Wound #1 Sacrum o Boardered Foam Dressing Dressing Change Frequency Wound #1 Sacrum o Change dressing every day. Follow-up Appointments o Return Appointment in 1 week. Off-Loading Wound #1 Sacrum o Mattress - Continue air mattress o Turn and reposition every 2 hours Additional Orders / Instructions Konigsberg, Capri L. ([id number]) Wound #1 Sacrum o Increase protein intake. o Activity as tolerated o Other: - Please add vitamin A, vitamin C, multivitamin and zinc supplements to your diet - available over the counter [place] [facility] [place] Visits - [facility]- HHRN to initiate NPWT once available and change dressing 3 times weekly o [place] Nurse may visit PRN to address patientos wound care  needs. o FACE TO FACE ENCOUNTER: MEDICARE and MEDICAID PATIENTS: I certify that this patient is under my care and that I had a face-to-face encounter that meets the physician face-to-face encounter requirements with this patient on this date. The encounter with the patient was in whole or in part for the following MEDICAL CONDITION: (primary reason for [place]) MEDICAL NECESSITY: I certify, that based on my findings, NURSING  services are a medically necessary home health service. HOME BOUND STATUS: I certify that my clinical findings support that this patient is homebound (i.e., Due to illness or injury, pt requires aid of supportive devices such as crutches, cane, wheelchairs, walkers, the use of special transportation or the assistance of another person to leave their place of residence. There is a normal inability to leave the home and doing so requires considerable and taxing effort. Other absences are for medical reasons / religious services and are infrequent or of short duration when for other reasons). o If current dressing causes regression in wound condition, may D/C ordered dressing product/s and apply Normal Saline Moist Dressing daily until next [place] / Other MD appointment. [place] of regression in wound condition at [phone]. o Please direct any NON-WOUND related issues/requests for orders to patient's Primary Care Physician Negative Pressure Wound Therapy Wound #1 Sacrum o Wound VAC settings at 125/130 mmHg continuous pressure. Use BLACK/GREEN foam to wound cavity. Use WHITE foam to fill any tunnel/s and/or undermining. Change VAC dressing 3 X WEEK. Change canister as indicated when full. Nurse may titrate settings and frequency of dressing changes as clinically indicated. - HHRN to initiate NPWT once available and change dressing 3 times weekly Medications-please add to medication list. Wound #1 Sacrum o Santyl Enzymatic  Ointment Electronic Signature(s) Signed: 12/04/2017 5:50:18 PM By: Gretta Cool, BSN, RN, CWS, Kim RN, BSN Signed: 12/04/2017 6:11:24 PM By: Worthy Keeler PA-C Entered By: Gretta Cool, BSN, RN, CWS, Kim on 12/04/2017 15:10:37 [place], [place] ([id number]) -------------------------------------------------------------------------------- Problem List Details Patient Name: Torrence, Oriyah L. Date of Service: 12/04/2017 2:30 PM Medical Record Number: [id number] Patient Account Number: [id number] Date of Birth/Sex: 1930/03/16 (84 y.o. Female) Treating RN: Cornell Barman Primary Care Provider: Priscille Kluver Other Clinician: Referring Provider: Priscille Kluver Treating Provider/Extender: Melburn Hake, Khloee Garza Weeks in Treatment: 8 Active Problems ICD-10 Encounter Code Description Active Date Diagnosis L89.154 Pressure ulcer of sacral region, stage 4 10/09/2017 Yes E44.0 Moderate protein-calorie malnutrition 10/09/2017 Yes Z99.3 Dependence on wheelchair 10/09/2017 Yes Inactive Problems Resolved Problems Electronic Signature(s) Signed: 12/04/2017 6:11:24 PM By: Worthy Keeler PA-C Entered By: Worthy Keeler on 12/04/2017 14:54:18 [place], [place]. ([id number]) -------------------------------------------------------------------------------- Progress Note Details Patient Name: [name], [name] Date of Service: 12/04/2017 2:30 PM Medical Record Number: [id number] Patient Account Number: [id number] Date of Birth/Sex: Sep 28, 1930 (84 y.o. Female) Treating RN: Cornell Barman Primary Care Provider: Priscille Kluver Other Clinician: Referring Provider: Priscille Kluver Treating Provider/Extender: Melburn Hake, Jesusa Stenerson Weeks in Treatment: 8 Subjective Chief Complaint Information obtained from Patient she is here in follow-up evaluation of a sacral ulcer History of Present Illness (HPI) The following HPI elements were documented for the patient's wound: Location: sacral pressure ulcer Quality: Patient reports No Pain. Severity: Patient  states wound (s) are getting better. Duration: Patient has had the wound for > 3 months prior to seeking treatment at the wound center Context: The wound would happen gradually Modifying Factors: Other treatment(s) tried include:surgical debridement, IV antibiotics and occupational therapy and physical therapy Associated Signs and Symptoms: Patient reports having:appropriate treatments and home health 84 year old patient who has been referred to [place] for the sacral decubitus ulcer was admitted to the [facility] on 07/18/2017 for an acute sacral decubitus with osteomyelitis. She was treated there with IV antibiotics and surgical debridement was done on 08/06/2017 and transferred back to a swing bed 08/12/2017. She has completed antibiotics on 09/18/2017 and has been now living at home. Past medical history  of GERD, fracture of right humerus, hypertension, rectal cancer, status post colostomy, laparoscopic cholecystectomy, debridement of sacral decubitus ulcer including subcutaneous tissue muscle and bone done on 08/06/2017, previous rectal cancer surgery in April 2017. Reviewing notes from the [facility] it was noted that she was treated for a MRSA sacral osteomyelitis and Proteus bacteremia requiring IV antibiotics which included clindamycin and vancomycin. Vancomycin had to be stopped on September 25 due to neutropenia and thrombocytopenia and changed to daptomycin until 09/20/2017. Infectious disease then advised to switch to clindamycin and the PICC line was removed on 09/22/2017. review of the electronic medical records noted that cultures of bone and tissue taken from the OR for wound debridement on 95 grew MRSA and she was started on IV antibiotics initially clindamycin and switched to IV vancomycin and the Proteus bacteremia was stained treated with by mouth levofloxacin and Flagyl for 6 weeks to end on 09/03/2017. She was also advised an offloading mattress and would follow-up  with supportive care 10/17/2017 -- the patient is ambulating a bit with the help of OT and PT and she is also eating much better. She has got inappropriate air mattress in place. 11/06/2017 -- the patient is a bit more ambivalent and continues to work with her mobilization. She is not yet ready for a wound VAC. 11/20/17 she is here in follow-up evaluation of a sacral ulcer. Wound VAC has not been improved, will continue with Santyl and anticipate that she can transfer to wound VAC at next appointment, bone fragments present in wound, no indication of infection. 11/27/2017 -- the patient has still not received her wound VAC and has been unable to explain to others by the home health nurse has not got it. Our nurses will look into this today 12/04/17 on evaluation today patient appears to be doing very well in regard to her sacral wound. We have been using Santyl although the wound appears to be excellent at this point without any evidence of slough noted in the wound bed. No fevers, chills, nausea, or vomiting noted at this time. She has been tolerating the dressing changes without any significant discomfort [name], [name] ([id number]) or pain. Patient History Information obtained from Patient. Family History Cancer - Child, No family history of Diabetes, Heart Disease, Hereditary Spherocytosis, Hypertension, Kidney Disease, Lung Disease, Seizures, Stroke, Thyroid Problems, Tuberculosis. Social History Never smoker, Marital Status - Married, Alcohol Use - Never, Drug Use - No History, Caffeine Use - Daily. Medical And Surgical History Notes Gastrointestinal colostomy since 1990 Oncologic colorectal - 1990 Review of Systems (ROS) Constitutional Symptoms (General Health) Denies complaints or symptoms of Fever, Chills. Respiratory The patient has no complaints or symptoms. Cardiovascular The patient has no complaints or symptoms. Psychiatric The patient has no complaints or  symptoms. Objective Constitutional Well-nourished and well-hydrated in no acute distress. Vitals Time Taken: 2:39 PM, Height: 64 in, Weight: 200 lbs, BMI: 34.3, Temperature: 97.6 F, Pulse: 58 bpm, Respiratory Rate: 16 breaths/min, Blood Pressure: 168/68 mmHg. Respiratory normal breathing without difficulty. clear to auscultation bilaterally. Cardiovascular regular rate and rhythm with normal S1, S2. Psychiatric this patient is able to make decisions and demonstrates good insight into disease process. Alert and Oriented x 3. pleasant and cooperative. General Notes: And patient has excellent quality to the wound bed in general there is some good granulation she also has some bone exposure in the center of the wound although this is minimal and does not appear to be drying out or brittle. [name], [name] (  [id number]) Integumentary (Hair, Skin) Wound #1 status is Open. Original cause of wound was Pressure Injury. The wound is located on the Sacrum. The wound measures 2.2cm length x 1.4cm width x 1.2cm depth; 2.419cm^2 area and 2.903cm^3 volume. There is bone, tendon, Fat Layer (Subcutaneous Tissue) Exposed, and fascia exposed. There is no tunneling noted, however, there is undermining starting at 9:00 and ending at 5:00 with a maximum distance of 3.2cm. There is a large amount of serous drainage noted. The wound margin is flat and intact. There is medium (34-66%) red granulation within the wound bed. There is a medium (34- 66%) amount of necrotic tissue within the wound bed including Eschar and Adherent Slough. The periwound skin appearance exhibited: Scarring. The periwound skin appearance did not exhibit: Callus, Crepitus, Excoriation, Induration, Rash, Dry/Scaly, Maceration, Atrophie Blanche, Cyanosis, Ecchymosis, Hemosiderin Staining, Mottled, Pallor, Rubor, Erythema. Periwound temperature was noted as No Abnormality. Assessment Active Problems ICD-10 L89.154 - Pressure ulcer of  sacral region, stage 4 E44.0 - Moderate protein-calorie malnutrition Z99.3 - Dependence on wheelchair Plan Wound Cleansing: Wound #1 Sacrum: Clean wound with Normal Saline. Cleanse wound with mild soap and water May Shower, gently pat wound dry prior to applying new dressing. Anesthetic (add to Medication List): Wound #1 Sacrum: Topical Lidocaine 4% cream applied to wound bed prior to debridement (In Clinic Only). Skin Barriers/Peri-Wound Care: Wound #1 Sacrum: Skin Prep Primary Wound Dressing: Wound #1 Sacrum: Silvercel Non-Adherent Secondary Dressing: Wound #1 Sacrum: Boardered Foam Dressing Dressing Change Frequency: Wound #1 Sacrum: Change dressing every day. Follow-up Appointments: Return Appointment in 1 week. Off-Loading: Wound #1 Sacrum: Mattress - Continue air mattress Turn and reposition every 2 hours Additional Orders / Instructions: [name], [name] ([id number]) Wound #1 Sacrum: Increase protein intake. Activity as tolerated Other: - Please add vitamin A, vitamin C, multivitamin and zinc supplements to your diet - available over the counter Home Health: Wound #1 Sacrum: [place] Visits - [facility]- HHRN to initiate NPWT once available and change dressing 3 times weekly Home Health Nurse may visit PRN to address patient s wound care needs. FACE TO FACE ENCOUNTER: MEDICARE and MEDICAID PATIENTS: I certify that this patient is under my care and that I had a face-to-face encounter that meets the physician face-to-face encounter requirements with this patient on this date. The encounter with the patient was in whole or in part for the following MEDICAL CONDITION: (primary reason for [place]) MEDICAL NECESSITY: I certify, that based on my findings, NURSING services are a medically necessary home health service. HOME BOUND STATUS: I certify that my clinical findings support that this patient is homebound (i.e., Due to illness or injury, pt  requires aid of supportive devices such as crutches, cane, wheelchairs, walkers, the use of special transportation or the assistance of another person to leave their place of residence. There is a normal inability to leave the home and doing so requires considerable and taxing effort. Other absences are for medical reasons / religious services and are infrequent or of short duration when for other reasons). If current dressing causes regression in wound condition, may D/C ordered dressing product/s and apply Normal Saline Moist Dressing daily until next [place] / Other MD appointment. [place] of regression in wound condition at [phone]. Please direct any NON-WOUND related issues/requests for orders to patient's Primary Care Physician Negative Pressure Wound Therapy: Wound #1 Sacrum: Wound VAC settings at 125/130 mmHg continuous pressure. Use BLACK/GREEN foam to wound cavity. Use WHITE foam  to fill any tunnel/s and/or undermining. Change VAC dressing 3 X WEEK. Change canister as indicated when full. Nurse may titrate settings and frequency of dressing changes as clinically indicated. - HHRN to initiate NPWT once available and change dressing 3 times weekly Medications-please add to medication list.: Wound #1 Sacrum: Santyl Enzymatic Ointment I am still in agreement with the fact that I think a Wound VAC would be the most appropriate treatment for the patient at this point. And therefore going to proceed with following up on the approval for this as it appears been for approved but yet we have not been able to initiate this. Apparently there's something that needs to be signed which we will work on taking care of. In the meantime we will see her back for reevaluation in one week. Please see above for specific wound care orders. We will see patient for re-evaluation in 1 week(s) here in the clinic. If anything worsens or changes patient will contact our office  for additional recommendations. Electronic Signature(s) Signed: 12/04/2017 6:11:24 PM By: Worthy Keeler PA-C Entered By: Worthy Keeler on 12/04/2017 15:36:13 [name], [name]. ([id number]) -------------------------------------------------------------------------------- ROS/PFSH Details Patient Name: [name], Devonna L. Date of Service: 12/04/2017 2:30 PM Medical Record Number: [id number] Patient Account Number: [id number] Date of Birth/Sex: Mar 17, 1930 (84 y.o. Female) Treating RN: Cornell Barman Primary Care Provider: Priscille Kluver Other Clinician: Referring Provider: Priscille Kluver Treating Provider/Extender: Melburn Hake, Megha Agnes Weeks in Treatment: 8 Information Obtained From Patient Wound History Do you currently have one or more open woundso Yes How many open wounds do you currently haveo 1 Approximately how long have you had your woundso 3 months How have you been treating your wound(s) until nowo santyl Has your wound(s) ever healed and then re-openedo No Have you had any lab work done in the past montho Yes Who ordered the lab work [facility] hospital Have you tested positive for an antibiotic resistant organism (MRSA, VRE)o No Have you tested positive for osteomyelitis (bone infection)o Yes Have you had any tests for circulation on your legso No Constitutional Symptoms (General Health) Complaints and Symptoms: Negative for: Fever; Chills Hematologic/Lymphatic Medical History: Positive for: Anemia Negative for: Hemophilia; Human Immunodeficiency Virus; Lymphedema; Sickle Cell Disease Respiratory Complaints and Symptoms: No Complaints or Symptoms Medical History: Negative for: Aspiration; Asthma; Chronic Obstructive Pulmonary Disease (COPD); Pneumothorax; Sleep Apnea; Tuberculosis Cardiovascular Complaints and Symptoms: No Complaints or Symptoms Medical History: Positive for: Congestive Heart Failure; Hypertension Negative for: Angina; Arrhythmia; Coronary Artery Disease;  Deep Vein Thrombosis; Hypotension; Myocardial Infarction; Peripheral Arterial Disease; Peripheral Venous Disease Gastrointestinal Medical History: Negative for: Cirrhosis ; Colitis; Crohnos; Hepatitis A; Hepatitis B Past Medical History Notes: [name], [place]. ([id number]) colostomy since 1990 Immunological Medical History: Negative for: Lupus Erythematosus; Raynaudos; Scleroderma Musculoskeletal Medical History: Positive for: Osteoarthritis; Osteomyelitis Negative for: Gout; Rheumatoid Arthritis Neurologic Medical History: Negative for: Dementia; Neuropathy; Quadriplegia Oncologic Medical History: Positive for: Received Chemotherapy; Received Radiation Past Medical History Notes: colorectal - 1990 Psychiatric Complaints and Symptoms: No Complaints or Symptoms Immunizations Pneumococcal Vaccine: Received Pneumococcal Vaccination: Yes Immunization Notes: up to date Implantable Devices Family and Social History Cancer: Yes - Child; Diabetes: No; Heart Disease: No; Hereditary Spherocytosis: No; Hypertension: No; Kidney Disease: No; Lung Disease: No; Seizures: No; Stroke: No; Thyroid Problems: No; Tuberculosis: No; Never smoker; Marital Status - Married; Alcohol Use: Never; Drug Use: No History; Caffeine Use: Daily; Financial Concerns: No; Food, Clothing or Shelter Needs: No; Support System Lacking: No; Transportation Concerns: No; Advanced Directives: Yes (  Not Provided); Patient does not want information on Advanced Directives; Living Will: Yes (Not Provided); Medical Power of Attorney: Yes - son - Jazzma Neidhardt (Not Provided) Physician Affirmation I have reviewed and agree with the above information. Electronic Signature(s) Signed: 12/04/2017 5:50:18 PM By: Gretta Cool, BSN, RN, CWS, Kim RN, BSN Signed: 12/04/2017 6:11:24 PM By: Worthy Keeler PA-C Entered By: Worthy Keeler on 12/04/2017 15:02:36 [place], [place]  ([id number]) -------------------------------------------------------------------------------- SuperBill Details Patient Name: [name], Jakari L. Date of Service: 12/04/2017 Medical Record Number: [id number] Patient Account Number: [id number] Date of Birth/Sex: [birth year] (84 y.o. Female) Treating RN: Cornell Barman Primary Care Provider: Priscille Kluver Other Clinician: Referring Provider: Priscille Kluver Treating Provider/Extender: Melburn Hake, Catricia Scheerer Weeks in Treatment: 8 Diagnosis Coding ICD-10 Codes Code Description L89.154 Pressure ulcer of sacral region, stage 4 E44.0 Moderate protein-calorie malnutrition Z99.3 Dependence on wheelchair Physician Procedures CPT4 Code: 4481856 Description: 31497 - WC PHYS LEVEL 3 - EST PT ICD-10 Diagnosis Description L89.154 Pressure ulcer of sacral region, stage 4 E44.0 Moderate protein-calorie malnutrition Z99.3 Dependence on wheelchair Modifier: Quantity: 1 Electronic Signature(s) Signed: 12/04/2017 6:11:24 PM By: Worthy Keeler PA-C Entered By: Worthy Keeler on 12/04/2017 15:04:24

## 2017-12-05 NOTE — Progress Notes (Signed)
PARYS, ELENBAAS ([id number]) Visit Report for 12/04/2017 Arrival Information Details Patient Name: SCALF, Kalli L. Date of Service: 12/04/2017 2:30 PM Medical Record Number: [id number] Patient Account Number: [id number] Date of Birth/Sex: 07-04-1930 (84 y.o. Female) Treating RN: Cornell Barman Primary Care Sabrena Gavitt: Priscille Kluver Other Clinician: Referring Sathvik Tiedt: Priscille Kluver Treating Jaydan Chretien/Extender: Melburn Hake, HOYT Weeks in Treatment: 8 Visit Information History Since Last Visit Added or deleted any medications: No Patient Arrived: Wheel Chair Any new allergies or adverse reactions: No Arrival Time: 14:38 Had a fall or experienced change in No Accompanied By: caregiver, activities of daily living that may affect CArolyn risk of falls: Transfer Assistance: Manual Signs or symptoms of abuse/neglect since last visito No Patient Identification Verified: Yes Hospitalized since last visit: No Secondary Verification Process Yes Has Dressing in Place as Prescribed: Yes Completed: Pain Present Now: No Patient Requires Transmission-Based No Precautions: Patient Has Alerts: No Electronic Signature(s) Signed: 12/04/2017 5:50:18 PM By: Gretta Cool, BSN, RN, CWS, Kim RN, BSN Entered By: Gretta Cool, BSN, RN, CWS, Kim on 12/04/2017 14:39:18 [place], [place]. ([id number]) -------------------------------------------------------------------------------- Clinic Level of Care Assessment Details Patient Name: Hagmann, Joyice L. Date of Service: 12/04/2017 2:30 PM Medical Record Number: [id number] Patient Account Number: [id number] Date of Birth/Sex: 05/17/1930 (84 y.o. Female) Treating RN: Cornell Barman Primary Care Danyale Ridinger: Priscille Kluver Other Clinician: Referring Emersen Carroll: Priscille Kluver Treating Gabreille Dardis/Extender: Melburn Hake, HOYT Weeks in Treatment: 8 Clinic Level of Care Assessment Items TOOL 4 Quantity Score []  - Use when only an EandM is performed on FOLLOW-UP visit 0 ASSESSMENTS - Nursing Assessment /  Reassessment []  - Reassessment of Co-morbidities (includes updates in patient status) 0 X- 1 5 Reassessment of Adherence to Treatment Plan ASSESSMENTS - Wound and Skin Assessment / Reassessment X - Simple Wound Assessment / Reassessment - one wound 1 5 []  - 0 Complex Wound Assessment / Reassessment - multiple wounds []  - 0 Dermatologic / Skin Assessment (not related to wound area) ASSESSMENTS - Focused Assessment []  - Circumferential Edema Measurements - multi extremities 0 []  - 0 Nutritional Assessment / Counseling / Intervention []  - 0 Lower Extremity Assessment (monofilament, tuning fork, pulses) []  - 0 Peripheral Arterial Disease Assessment (using hand held doppler) ASSESSMENTS - Ostomy and/or Continence Assessment and Care []  - Incontinence Assessment and Management 0 []  - 0 Ostomy Care Assessment and Management (repouching, etc.) PROCESS - Coordination of Care X - Simple Patient / Family Education for ongoing care 1 15 []  - 0 Complex (extensive) Patient / Family Education for ongoing care X- 1 10 Staff obtains Programmer, systems, Records, Test Results / Process Orders []  - 0 Staff telephones HHA, Nursing Homes / Clarify orders / etc []  - 0 Routine Transfer to another Facility (non-emergent condition) []  - 0 Routine Hospital Admission (non-emergent condition) []  - 0 New Admissions / Biomedical engineer / Ordering NPWT, Apligraf, etc. []  - 0 Emergency Hospital Admission (emergent condition) X- 1 10 Simple Discharge Coordination Sanks, Hannie L. ([id number]) []  - 0 Complex (extensive) Discharge Coordination PROCESS - Special Needs []  - Pediatric / Minor Patient Management 0 []  - 0 Isolation Patient Management []  - 0 Hearing / Language / Visual special needs []  - 0 Assessment of Community assistance (transportation, D/C planning, etc.) []  - 0 Additional assistance / Altered mentation []  - 0 Support Surface(s) Assessment (bed, cushion, seat, etc.) INTERVENTIONS -  Wound Cleansing / Measurement X - Simple Wound Cleansing - one wound 1 5 []  - 0 Complex Wound Cleansing - multiple wounds X- 1 5 Wound Imaging (photographs -  any number of wounds) []  - 0 Wound Tracing (instead of photographs) X- 1 5 Simple Wound Measurement - one wound []  - 0 Complex Wound Measurement - multiple wounds INTERVENTIONS - Wound Dressings []  - Small Wound Dressing one or multiple wounds 0 X- 1 15 Medium Wound Dressing one or multiple wounds []  - 0 Large Wound Dressing one or multiple wounds []  - 0 Application of Medications - topical []  - 0 Application of Medications - injection INTERVENTIONS - Miscellaneous []  - External ear exam 0 []  - 0 Specimen Collection (cultures, biopsies, blood, body fluids, etc.) []  - 0 Specimen(s) / Culture(s) sent or taken to Lab for analysis []  - 0 Patient Transfer (multiple staff / Civil Service fast streamer / Similar devices) []  - 0 Simple Staple / Suture removal (25 or less) []  - 0 Complex Staple / Suture removal (26 or more) []  - 0 Hypo / Hyperglycemic Management (close monitor of Blood Glucose) []  - 0 Ankle / Brachial Index (ABI) - do not check if billed separately X- 1 5 Vital Signs Kimbell, Delmy L. ([id number]) Has the patient been seen at the hospital within the last three years: Yes Total Score: 80 Level Of Care: New/Established - Level 3 Electronic Signature(s) Signed: 12/04/2017 5:50:18 PM By: Gretta Cool, BSN, RN, CWS, Kim RN, BSN Entered By: Gretta Cool, BSN, RN, CWS, Kim on 12/04/2017 15:11:04 [place], [place] ([id number]) -------------------------------------------------------------------------------- Encounter Discharge Information Details Patient Name: [name], Ulyana L. Date of Service: 12/04/2017 2:30 PM Medical Record Number: [id number] Patient Account Number: [id number] Date of Birth/Sex: November 16, 1930 (84 y.o. Female) Treating RN: Cornell Barman Primary Care Avanna Sowder: Priscille Kluver Other Clinician: Referring Mechele Kittleson: Priscille Kluver Treating Xian Alves/Extender: Melburn Hake, HOYT Weeks in Treatment: 8 Encounter Discharge Information Items Discharge Pain Level: 0 Discharge Condition: Stable Ambulatory Status: Wheelchair Discharge Destination: Home Transportation: Private Auto Accompanied By: caregiver Schedule Follow-up Appointment: Yes Medication Reconciliation completed and Yes provided to Patient/Care Lael Wetherbee: Patient Clinical Summary of Care: Declined Electronic Signature(s) Signed: 12/04/2017 5:06:22 PM By: Gretta Cool, BSN, RN, CWS, Kim RN, BSN Entered By: Gretta Cool, BSN, RN, CWS, Kim on 12/04/2017 17:06:21 [place], [place] ([id number]) -------------------------------------------------------------------------------- Lower Extremity Assessment Details Patient Name: Kail, Aviyanna L. Date of Service: 12/04/2017 2:30 PM Medical Record Number: [id number] Patient Account Number: [id number] Date of Birth/Sex: 07-20-1930 (84 y.o. Female) Treating RN: Cornell Barman Primary Care Raysa Bosak: Priscille Kluver Other Clinician: Referring Alilah Mcmeans: Priscille Kluver Treating Asaf Elmquist/Extender: Worthy Keeler Weeks in Treatment: 8 Electronic Signature(s) Signed: 12/04/2017 5:50:18 PM By: Gretta Cool, BSN, RN, CWS, Kim RN, BSN Entered By: Gretta Cool, BSN, RN, CWS, Kim on 12/04/2017 14:44:02 [place], [place] ([id number]) -------------------------------------------------------------------------------- Multi Wound Chart Details Patient Name: [name], Jaleah L. Date of Service: 12/04/2017 2:30 PM Medical Record Number: [id number] Patient Account Number: [id number] Date of Birth/Sex: October 28, 1930 (84 y.o. Female) Treating RN: Cornell Barman Primary Care Merlyn Bollen: Priscille Kluver Other Clinician: Referring Camari Wisham: Priscille Kluver Treating Morrissa Shein/Extender: Melburn Hake, HOYT Weeks in Treatment: 8 Vital Signs Height(in): 64 Pulse(bpm): 58 Weight(lbs): 200 Blood Pressure(mmHg): 168/68 Body Mass Index(BMI): 34 Temperature(F): 97.6 Respiratory  Rate 16 (breaths/min): Photos: [1:No Photos] [N/A:N/A] Wound Location: [1:Sacrum] [N/A:N/A] Wounding Event: [1:Pressure Injury] [N/A:N/A] Primary Etiology: [1:Pressure Ulcer] [N/A:N/A] Comorbid History: [1:Anemia, Congestive Heart Failure, Hypertension, Osteoarthritis, Osteomyelitis, Received Chemotherapy, Received Radiation] [N/A:N/A] Date Acquired: [1:07/14/2017] [N/A:N/A] Weeks of Treatment: [1:8] [N/A:N/A] Wound Status: [1:Open] [N/A:N/A] Measurements L x W x D [1:2.2x1.4x1.2] [N/A:N/A] (cm) Area (cm) : [1:2.419] [N/A:N/A] Volume (cm) : [1:2.903] [N/A:N/A] % Reduction in Area: [1:-113.90%] [N/A:N/A] % Reduction in Volume: [1:-97.50%] [N/A:N/A] Starting Position  1 [1:9] (o'clock): Ending Position 1 [1:5] (o'clock): Maximum Distance 1 (cm): [1:3.2] Undermining: [1:Yes] [N/A:N/A] Classification: [1:Category/Stage IV] [N/A:N/A] Exudate Amount: [1:Large] [N/A:N/A] Exudate Type: [1:Serous] [N/A:N/A] Exudate Color: [1:amber] [N/A:N/A] Wound Margin: [1:Flat and Intact] [N/A:N/A] Granulation Amount: [1:Medium (34-66%)] [N/A:N/A] Granulation Quality: [1:Red] [N/A:N/A] Necrotic Amount: [1:Medium (34-66%)] [N/A:N/A] Necrotic Tissue: [1:Eschar, Adherent Slough] [N/A:N/A] Exposed Structures: [1:Fascia: Yes Fat Layer (Subcutaneous Tissue) Exposed: Yes Tendon: Yes] [N/A:N/A] Muscle: No Joint: No Bone: No Epithelialization: None N/A N/A Periwound Skin Texture: Scarring: Yes N/A N/A Excoriation: No Induration: No Callus: No Crepitus: No Rash: No Periwound Skin Moisture: Maceration: No N/A N/A Dry/Scaly: No Periwound Skin Color: Atrophie Blanche: No N/A N/A Cyanosis: No Ecchymosis: No Erythema: No Hemosiderin Staining: No Mottled: No Pallor: No Rubor: No Temperature: No Abnormality N/A N/A Tenderness on Palpation: No N/A N/A Wound Preparation: Ulcer Cleansing: N/A N/A Rinsed/Irrigated with Saline Topical Anesthetic Applied: Other: lidocaine 4% Treatment  Notes Electronic Signature(s) Signed: 12/04/2017 5:50:18 PM By: Gretta Cool, BSN, RN, CWS, Kim RN, BSN Entered By: Gretta Cool, BSN, RN, CWS, Kim on 12/04/2017 14:45:47 [place], Jon Gills ([id number]) -------------------------------------------------------------------------------- [place] Details Patient Name: [name], Halcyon L. Date of Service: 12/04/2017 2:30 PM Medical Record Number: [id number] Patient Account Number: [id number] Date of Birth/Sex: 17-Jan-1930 (84 y.o. Female) Treating RN: Cornell Barman Primary Care Johnedward Brodrick: Priscille Kluver Other Clinician: Referring Ninetta Adelstein: Priscille Kluver Treating Mercadies Co/Extender: Melburn Hake, HOYT Weeks in Treatment: 8 Active Inactive ` Abuse / Safety / Falls / Self Care Management Nursing Diagnoses: Potential for falls Goals: Patient will not experience any injury related to falls Date Initiated: 10/09/2017 Target Resolution Date: 01/10/2018 Goal Status: Active Interventions: Assess fall risk on admission and as needed Notes: ` Orientation to the Wound Care Program Nursing Diagnoses: Knowledge deficit related to the wound healing center program Goals: Patient/caregiver will verbalize understanding of the [place] Program Date Initiated: 10/09/2017 Target Resolution Date: 01/10/2018 Goal Status: Active Interventions: Provide education on orientation to the wound center Notes: ` Pressure Nursing Diagnoses: Potential for impaired tissue integrity related to pressure, friction, moisture, and shear Goals: Patient will remain free from development of additional pressure ulcers Date Initiated: 10/09/2017 Target Resolution Date: 01/10/2018 Goal Status: Active Interventions: Zamirah, Denny Nettye L. ([id number]) Assess offloading mechanisms upon admission and as needed Provide education on pressure ulcers Notes: ` Wound/Skin Impairment Nursing Diagnoses: Impaired tissue integrity Goals: Ulcer/skin breakdown will heal within 14  weeks Date Initiated: 10/09/2017 Target Resolution Date: 01/10/2018 Goal Status: Active Interventions: Assess patient/caregiver ability to obtain necessary supplies Assess patient/caregiver ability to perform ulcer/skin care regimen upon admission and as needed Assess ulceration(s) every visit Notes: Electronic Signature(s) Signed: 12/04/2017 5:50:18 PM By: Gretta Cool, BSN, RN, CWS, Kim RN, BSN Entered By: Gretta Cool, BSN, RN, CWS, Kim on 12/04/2017 14:44:13 [place], [place] ([id number]) -------------------------------------------------------------------------------- Pain Assessment Details Patient Name: Milliron, Noelene L. Date of Service: 12/04/2017 2:30 PM Medical Record Number: [id number] Patient Account Number: [id number] Date of Birth/Sex: 05-10-1930 (84 y.o. Female) Treating RN: Cornell Barman Primary Care Delno Blaisdell: Priscille Kluver Other Clinician: Referring Kiira Brach: Priscille Kluver Treating Loman Logan/Extender: Melburn Hake, HOYT Weeks in Treatment: 8 Active Problems Location of Pain Severity and Description of Pain Patient Has Paino No Site Locations With Dressing Change: No Pain Management and Medication Current Pain Management: Electronic Signature(s) Signed: 12/04/2017 5:50:18 PM By: Gretta Cool, BSN, RN, CWS, Kim RN, BSN Entered By: Gretta Cool, BSN, RN, CWS, Kim on 12/04/2017 14:39:27 [place], Emylia Carlean Jews ([id number]) -------------------------------------------------------------------------------- Patient/Caregiver Education Details Patient Name: [name], Desire L. Date of Service: 12/04/2017 2:30 PM Medical  Record Number: [id number] Patient Account Number: [id number] Date of Birth/Gender: 1930-09-29 (84 y.o. Female) Treating RN: Cornell Barman Primary Care Physician: Priscille Kluver Other Clinician: Referring Physician: Priscille Kluver Treating Physician/Extender: Sharalyn Ink in Treatment: 8 Education Assessment Education Provided To: Patient Education Topics Provided Wound/Skin  Impairment: Handouts: Caring for Your Ulcer, Other: Orders sent to [facility] Methods: Demonstration, Explain/Verbal, Printed Responses: State content correctly Electronic Signature(s) Signed: 12/04/2017 5:50:18 PM By: Gretta Cool, BSN, RN, CWS, Kim RN, BSN Entered By: Gretta Cool, BSN, RN, CWS, Kim on 12/04/2017 17:06:48 [place], [place] ([id number]) -------------------------------------------------------------------------------- Wound Assessment Details Patient Name: Gfeller, Scotlynn L. Date of Service: 12/04/2017 2:30 PM Medical Record Number: [id number] Patient Account Number: [id number] Date of Birth/Sex: [birth year] (84 y.o. Female) Treating RN: Cornell Barman Primary Care Bastian Andreoli: Priscille Kluver Other Clinician: Referring Peyten Punches: Priscille Kluver Treating Chancy Claros/Extender: Melburn Hake, HOYT Weeks in Treatment: 8 Wound Status Wound Number: 1 Primary Pressure Ulcer Etiology: Wound Location: Sacrum Wound Open Wounding Event: Pressure Injury Status: Date Acquired: 07/14/2017 Comorbid Anemia, Congestive Heart Failure, Weeks Of Treatment: 8 History: Hypertension, Osteoarthritis, Osteomyelitis, Clustered Wound: No Received Chemotherapy, Received Radiation Photos Wound Measurements Length: (cm) 2.2 % Reduction Width: (cm) 1.4 % Reduction Depth: (cm) 1.2 Epithelializ Area: (cm) 2.419 Tunneling: Volume: (cm) 2.903 Undermining Starting Ending Po Maximum D in Area: -113.9% in Volume: -97.5% ation: None No : Yes Position (o'clock): 9 sition (o'clock): 5 istance: (cm) 3.2 Wound Description Classification: Category/Stage IV Foul Odor Af Wound Margin: Flat and Intact Slough/Fibri Exudate Amount: Large Exudate Type: Serous Exudate Color: amber ter Cleansing: No no Yes Wound Bed Granulation Amount: Medium (34-66%) Exposed Structure Granulation Quality: Red Fascia Exposed: Yes Necrotic Amount: Medium (34-66%) Fat Layer (Subcutaneous Tissue) Exposed: Yes Necrotic Quality: Eschar, Adherent  Slough Tendon Exposed: Yes Muscle Exposed: No Joint Exposed: No Bone Exposed: Yes Gossman, Ceazia L. ([id number]) Periwound Skin Texture Texture Color No Abnormalities Noted: No No Abnormalities Noted: No Callus: No Atrophie Blanche: No Crepitus: No Cyanosis: No Excoriation: No Ecchymosis: No Induration: No Erythema: No Rash: No Hemosiderin Staining: No Scarring: Yes Mottled: No Pallor: No Moisture Rubor: No No Abnormalities Noted: No Dry / Scaly: No Temperature / Pain Maceration: No Temperature: No Abnormality Wound Preparation Ulcer Cleansing: Rinsed/Irrigated with Saline Topical Anesthetic Applied: Other: lidocaine 4%, Treatment Notes Wound #1 (Sacrum) 1. Cleansed with: Clean wound with Normal Saline 2. Anesthetic Topical Lidocaine 4% cream to wound bed prior to debridement 3. Peri-wound Care: Skin Prep 4. Dressing Applied: Other dressing (specify in notes) 5. Secondary Dressing Applied Bordered Foam Dressing Notes [place] Electronic Signature(s) Signed: 12/04/2017 5:50:18 PM By: Gretta Cool, BSN, RN, CWS, Kim RN, BSN Entered By: Gretta Cool, BSN, RN, CWS, Kim on 12/04/2017 14:57:07 [place], Annasofia L. ([id number]) -------------------------------------------------------------------------------- [place] Details Patient Name: [name], Alaiza L. Date of Service: 12/04/2017 2:30 PM Medical Record Number: [id number] Patient Account Number: [id number] Date of Birth/Sex: [birth year] (84 y.o. Female) Treating RN: Cornell Barman Primary Care Rashida Ladouceur: Priscille Kluver Other Clinician: Referring Latisia Hilaire: Priscille Kluver Treating Lorain Fettes/Extender: Melburn Hake, HOYT Weeks in Treatment: 8 Vital Signs Time Taken: 14:39 Temperature (F): 97.6 Height (in): 64 Pulse (bpm): 58 Weight (lbs): 200 Respiratory Rate (breaths/min): 16 Body Mass Index (BMI): 34.3 Blood Pressure (mmHg): 168/68 Reference Range: 80 - 120 mg / dl Electronic Signature(s) Signed: 12/04/2017 5:50:18 PM By: Gretta Cool, BSN,  RN, CWS, Kim RN, BSN Entered By: Gretta Cool, BSN, RN, CWS, Kim on 12/04/2017 14:39:52

## 2017-12-11 ENCOUNTER — Encounter: Payer: Medicare Other | Admitting: Physician Assistant

## 2017-12-11 DIAGNOSIS — L89154 Pressure ulcer of sacral region, stage 4: Secondary | ICD-10-CM | POA: Diagnosis not present

## 2017-12-13 NOTE — Progress Notes (Signed)
ELYSA, WOMAC ([id number]) Visit Report for 12/11/2017 Arrival Information Details Patient Name: [name], [name] Date of Service: 12/11/2017 3:15 PM Medical Record Number: [id number] Patient Account Number: [id number] Date of Birth/Sex: 06-28-1930 (84 y.o. Female) Treating RN: [name] Primary Care [name]: Priscille Kluver Other Clinician: Referring [name]: Priscille Kluver Treating [name]/Extender: [name] [name]: 9 Visit Information History Since Last Visit Added or deleted any medications: No Patient Arrived: Wheel Chair Any new allergies or adverse reactions: No Arrival Time: 15:49 Had a fall or experienced change in No activities of daily living that may affect Accompanied By: friend risk of falls: Transfer Assistance: Manual Signs or symptoms of abuse/neglect since last visito No Patient Identification Verified: Yes Hospitalized since last visit: No Secondary Verification Process Completed: Yes Has Dressing in Place as Prescribed: Yes Patient Requires Transmission-Based No Pain Present Now: No Precautions: Patient Has Alerts: No Electronic Signature(s) Signed: 12/11/2017 5:27:42 PM By: [name] Cool, BSN, RN, CWS, Kim RN, BSN Entered By: [name] on 12/11/2017 15:56:39 [name], [name]. ([id number]) -------------------------------------------------------------------------------- Clinic Level of Care Assessment Details Patient Name: [name], [name] Date of Service: 12/11/2017 3:15 PM Medical Record Number: [id number] Patient Account Number: [id number] Date of Birth/Sex: 03-14-1930 (84 y.o. Female) Treating RN: [name] Primary Care [name]: Priscille Kluver Other Clinician: Referring Silvino Selman: Priscille Kluver Treating Tamella Tuccillo/Extender: [name] [name]: 9 Clinic Level of Care Assessment Items TOOL 4 Quantity Score []  - Use when only an EandM is performed on FOLLOW-UP visit 0 ASSESSMENTS - Nursing Assessment /  Reassessment []  - Reassessment of Co-morbidities (includes updates in patient status) 0 X- 1 5 Reassessment of Adherence to [name] Plan ASSESSMENTS - Wound and Skin Assessment / Reassessment X - Simple Wound Assessment / Reassessment - one wound 1 5 []  - 0 Complex Wound Assessment / Reassessment - multiple wounds []  - 0 Dermatologic / Skin Assessment (not related to wound area) ASSESSMENTS - Focused Assessment []  - Circumferential Edema Measurements - multi extremities 0 []  - 0 Nutritional Assessment / Counseling / Intervention []  - 0 Lower Extremity Assessment (monofilament, tuning fork, pulses) []  - 0 Peripheral Arterial Disease Assessment (using hand held doppler) ASSESSMENTS - Ostomy and/or Continence Assessment and Care []  - Incontinence Assessment and Management 0 []  - 0 Ostomy Care Assessment and Management (repouching, etc.) PROCESS - Coordination of Care X - Simple Patient / Family Education for ongoing care 1 15 []  - 0 Complex (extensive) Patient / Family Education for ongoing care X- 1 10 Staff obtains Programmer, systems, Records, Test Results / Process Orders []  - 0 Staff telephones HHA, Nursing Homes / Clarify orders / etc []  - 0 Routine Transfer to another Facility (non-emergent condition) []  - 0 Routine Hospital Admission (non-emergent condition) []  - 0 New Admissions / Biomedical engineer / Ordering NPWT, Apligraf, etc. []  - 0 Emergency Hospital Admission (emergent condition) X- 1 10 Simple Discharge Coordination [name], [name] ([id number]) []  - 0 Complex (extensive) Discharge Coordination PROCESS - Special Needs []  - Pediatric / Minor Patient Management 0 []  - 0 Isolation Patient Management []  - 0 Hearing / Language / Visual special needs []  - 0 Assessment of Community assistance (transportation, D/C planning, etc.) []  - 0 Additional assistance / Altered mentation []  - 0 Support Surface(s) Assessment (bed, cushion, seat, etc.) INTERVENTIONS -  Wound Cleansing / Measurement X - Simple Wound Cleansing - one wound 1 5 []  - 0 Complex Wound Cleansing - multiple wounds X- 1 5 Wound Imaging (photographs -  any number of wounds) []  - 0 Wound Tracing (instead of photographs) X- 1 5 Simple Wound Measurement - one wound []  - 0 Complex Wound Measurement - multiple wounds INTERVENTIONS - Wound Dressings []  - Small Wound Dressing one or multiple wounds 0 X- 1 15 Medium Wound Dressing one or multiple wounds []  - 0 Large Wound Dressing one or multiple wounds []  - 0 Application of Medications - topical []  - 0 Application of Medications - injection INTERVENTIONS - Miscellaneous []  - External ear exam 0 []  - 0 Specimen Collection (cultures, biopsies, blood, body fluids, etc.) []  - 0 Specimen(s) / Culture(s) sent or taken to Lab for analysis []  - 0 Patient Transfer (multiple staff / Civil Service fast streamer / Similar devices) []  - 0 Simple Staple / Suture removal (25 or less) []  - 0 Complex Staple / Suture removal (26 or more) []  - 0 Hypo / Hyperglycemic Management (close monitor of Blood Glucose) []  - 0 Ankle / Brachial Index (ABI) - do not check if billed separately X- 1 5 Vital Signs [name], [name] ([id number]) Has the patient been seen at the hospital within the last three years: Yes Total Score: 80 Level Of Care: New/Established - Level 3 Electronic Signature(s) Signed: 12/11/2017 5:27:42 PM By: [name] Cool, BSN, RN, CWS, Kim RN, BSN Entered By: [name] on 12/11/2017 16:25:13 [place], [place] ([id number]) -------------------------------------------------------------------------------- Encounter Discharge Information Details Patient Name: [name], [name] Date of Service: 12/11/2017 3:15 PM Medical Record Number: [id number] Patient Account Number: [id number] Date of Birth/Sex: 1930/09/30 (84 y.o. Female) Treating RN: [name] Primary Care Rechy Bost: Priscille Kluver Other Clinician: Referring Charell Faulk: Priscille Kluver Treating Alease Fait/Extender: [name] [name]: 9 Encounter Discharge Information Items Discharge Pain Level: 0 Discharge Condition: Stable Ambulatory Status: Wheelchair Discharge Destination: Home Transportation: Private Auto Accompanied By: friend/caregiver Schedule Follow-up Appointment: Yes Medication Reconciliation completed and Yes provided to Patient/Care Harmani Neto: Provided on Clinical Summary of Care: 12/11/2017 Form Type Recipient Paper Patient RA Electronic Signature(s) Signed: 12/12/2017 10:03:49 AM By: Ruthine Dose Entered By: Ruthine Dose on 12/11/2017 16:32:08 [place], [place] ([id number]) -------------------------------------------------------------------------------- Lower Extremity Assessment Details Patient Name: [name], [name] Date of Service: 12/11/2017 3:15 PM Medical Record Number: [id number] Patient Account Number: [id number] Date of Birth/Sex: [birth year] (84 y.o. Female) Treating RN: [name] Primary Care Sedra Morfin: Priscille Kluver Other Clinician: Referring Valencia Kassa: Priscille Kluver Treating Kaelin Holford/Extender: Worthy Keeler [name]: 9 Electronic Signature(s) Signed: 12/11/2017 5:27:42 PM By: [name] Cool, BSN, RN, CWS, Kim RN, BSN Entered By: [name] on 12/11/2017 16:05:39 [place], [place] ([id number]) -------------------------------------------------------------------------------- [place] Details Patient Name: [name], [name] Date of Service: 12/11/2017 3:15 PM Medical Record Number: [id number] Patient Account Number: [id number] Date of Birth/Sex: 13-Jun-1930 (84 y.o. Female) Treating RN: [name] Primary Care Benjy Kana: Priscille Kluver Other Clinician: Referring Garfield Coiner: Priscille Kluver Treating Manahil Vanzile/Extender: [name] [name]: 9 Active Inactive ` Abuse / Safety / Falls / Self Care Management Nursing Diagnoses: Potential for falls Goals: Patient will not  experience any injury related to falls Date Initiated: 10/09/2017 Target Resolution Date: 01/10/2018 Goal Status: Active Interventions: Assess fall risk on admission and as needed Notes: ` Orientation to the Wound Care Program Nursing Diagnoses: Knowledge deficit related to the wound healing center program Goals: Patient/caregiver will verbalize understanding of the [facility] Date Initiated: 10/09/2017 Target Resolution Date: 01/10/2018 Goal Status: Active Interventions: Provide education on orientation to the wound center Notes: ` Pressure Nursing Diagnoses:  Potential for impaired tissue integrity related to pressure, friction, moisture, and shear Goals: Patient will remain free from development of additional pressure ulcers Date Initiated: 10/09/2017 Target Resolution Date: 01/10/2018 Goal Status: Active Interventions: Marika, Mahaffy Kida L. ([id number]) Assess offloading mechanisms upon admission and as needed Provide education on pressure ulcers Notes: ` Wound/Skin Impairment Nursing Diagnoses: Impaired tissue integrity Goals: Ulcer/skin breakdown will heal within 14 [name] Date Initiated: 10/09/2017 Target Resolution Date: 01/10/2018 Goal Status: Active Interventions: Assess patient/caregiver ability to obtain necessary supplies Assess patient/caregiver ability to perform ulcer/skin care regimen upon admission and as needed Assess ulceration(s) every visit Notes: Electronic Signature(s) Signed: 12/11/2017 5:27:42 PM By: [name] Cool, BSN, RN, CWS, Kim RN, BSN Entered By: [name] on 12/11/2017 16:05:51 [place], [place] ([id number]) -------------------------------------------------------------------------------- Pain Assessment Details Patient Name: [name], [name] Date of Service: 12/11/2017 3:15 PM Medical Record Number: [id number] Patient Account Number: [id number] Date of Birth/Sex: 05/14/1930 (84 y.o. Female) Treating RN: [name] Primary  Care Ronel Rodeheaver: Priscille Kluver Other Clinician: Referring Kealohilani Maiorino: Priscille Kluver Treating Noha Karasik/Extender: [name] [name]: 9 Active Problems Location of Pain Severity and Description of Pain Patient Has Paino No Site Locations With Dressing Change: No Pain Management and Medication Current Pain Management: Electronic Signature(s) Signed: 12/11/2017 5:27:42 PM By: [name] Cool, BSN, RN, CWS, Kim RN, BSN Entered By: [name] on 12/11/2017 15:56:48 [place], [place] ([id number]) -------------------------------------------------------------------------------- Patient/Caregiver Education Details Patient Name: [name], [name] Date of Service: 12/11/2017 3:15 PM Medical Record Number: [id number] Patient Account Number: [id number] Date of Birth/Gender: Apr 10, 1930 (84 y.o. Female) Treating RN: [name] Primary Care Physician: Priscille Kluver Other Clinician: Referring Physician: Priscille Kluver Treating Physician/Extender: Sharalyn Ink in [name]: 9 Education Assessment Education Provided To: Patient Education Topics Provided Wound/Skin Impairment: Handouts: Caring for Your Ulcer, Other: wound care as prescribed Methods: Demonstration Responses: State content correctly Electronic Signature(s) Signed: 12/11/2017 5:27:42 PM By: [name] Cool, BSN, RN, CWS, Kim RN, BSN Entered By: [name] on 12/11/2017 16:26:18 [place], [place] ([id number]) -------------------------------------------------------------------------------- Wound Assessment Details Patient Name: [name], [name] Date of Service: 12/11/2017 3:15 PM Medical Record Number: [id number] Patient Account Number: [id number] Date of Birth/Sex: 03/21/1930 (84 y.o. Female) Treating RN: [name] Primary Care Sruthi Maurer: Priscille Kluver Other Clinician: Referring Rie Mcneil: Priscille Kluver Treating Dorathy Stallone/Extender: [name] [name]: 9 Wound Status Wound Number: 1 Primary  Pressure Ulcer Etiology: Wound Location: Sacrum Wound Open Wounding Event: Pressure Injury Status: Date Acquired: 07/14/2017 Comorbid Anemia, Congestive Heart Failure, [name] Of [name]: 9 History: Hypertension, Osteoarthritis, Osteomyelitis, Clustered Wound: No Received Chemotherapy, Received Radiation Photos Photo Uploaded By: [name] on 12/11/2017 16:08:57 Wound Measurements Length: (cm) 2.2 % Reductio Width: (cm) 0.6 % Reductio Depth: (cm) 1.2 Epithelial Area: (cm) 1.037 Undermini Volume: (cm) 1.244 Starti Ending Maximum n in Area: 8.3% n in Volume: 15.4% ization: None ng: Yes ng Position (o'clock): 11 Position (o'clock): 5 Distance: (cm) 3 Wound Description Classification: Category/Stage IV Foul Odor Wound Margin: Flat and Intact Slough/Fi Exudate Amount: Large Exudate Type: Serous Exudate Color: amber After Cleansing: No brino Yes Wound Bed Granulation Amount: Medium (34-66%) Exposed Structure Granulation Quality: Red Fascia Exposed: Yes Necrotic Amount: Medium (34-66%) Fat Layer (Subcutaneous Tissue) Exposed: Yes Necrotic Quality: Eschar, Adherent Slough Tendon Exposed: Yes Muscle Exposed: No Joint Exposed: No Bone Exposed: Yes [name], Loleta L. ([id number]) Periwound Skin Texture Texture Color No Abnormalities Noted: No No Abnormalities Noted: No Callus: No Atrophie Blanche: No Crepitus: No  Cyanosis: No Excoriation: No Ecchymosis: No Induration: No Erythema: No Rash: No Hemosiderin Staining: No Scarring: Yes Mottled: No Pallor: No Moisture Rubor: No No Abnormalities Noted: No Dry / Scaly: No Temperature / Pain Maceration: No Temperature: No Abnormality Wound Preparation Ulcer Cleansing: Rinsed/Irrigated with Saline Topical Anesthetic Applied: Other: lidocaine 4%, [name] Notes Wound #1 (Sacrum) 1. Cleansed with: Clean wound with Normal Saline 3. Peri-wound Care: Skin Prep 4. Dressing Applied: Other  dressing (specify in notes) 5. Secondary Dressing Applied Bordered Foam Dressing Notes [place] Electronic Signature(s) Signed: 12/11/2017 5:27:42 PM By: [name] Cool, BSN, RN, CWS, Kim RN, BSN Entered By: [name] on 12/11/2017 16:02:27 [place], [place] ([id number]) -------------------------------------------------------------------------------- Vitals Details Patient Name: [name], [name] Date of Service: 12/11/2017 3:15 PM Medical Record Number: [id number] Patient Account Number: [id number] Date of Birth/Sex: June 02, 1930 (84 y.o. Female) Treating RN: [name] Primary Care Carolynne Schuchard: Priscille Kluver Other Clinician: Referring Fransheska Willingham: Priscille Kluver Treating Kalecia Hartney/Extender: STONE III, [name] [name]: 9 Vital Signs Time Taken: 15:56 Pulse (bpm): 62 Height (in): 64 Respiratory Rate (breaths/min): 16 Weight (lbs): 200 Blood Pressure (mmHg): 140/78 Body Mass Index (BMI): 34.3 Reference Range: 80 - 120 mg / dl Notes . Electronic Signature(s) Signed: 12/11/2017 5:27:42 PM By: [name] Cool, BSN, RN, CWS, Kim RN, BSN Entered By: [name] on 12/11/2017 16:05:11

## 2017-12-14 NOTE — Progress Notes (Signed)
[name] ([id number]) Visit Report for 12/11/2017 Chief Complaint Document Details Patient Name: [name], [name] Date of Service: 12/11/2017 3:15 PM Medical Record Number: [id number] Patient Account Number: [id number] Date of Birth/Sex: [birth year] (84 y.o. Female) Treating RN: Montey Hora Primary Care Provider: Priscille [name] Other Clinician: Referring Provider: Priscille [name] Treating Provider/Extender: Melburn Hake, HOYT Weeks in Treatment: 9 Information Obtained from: Patient Chief Complaint she is here in follow-up evaluation of a sacral ulcer Electronic Signature(s) Signed: 12/12/2017 8:05:24 AM By: Worthy Keeler PA-C Entered By: Worthy Keeler on 12/11/2017 16:14:58 [place], [place] ([id number]) -------------------------------------------------------------------------------- HPI Details Patient Name: [name], [name] Date of Service: 12/11/2017 3:15 PM Medical Record Number: [id number] Patient Account Number: [id number] Date of Birth/Sex: 1929/12/23 (84 y.o. Female) Treating RN: Montey Hora Primary Care Provider: Priscille [name] Other Clinician: Referring Provider: Priscille [name] Treating Provider/Extender: STONE III, HOYT Weeks in Treatment: 9 History of Present Illness Location: sacral pressure ulcer Quality: Patient reports No Pain. Severity: Patient states wound (s) are getting better. Duration: Patient has had the wound for > 3 months prior to seeking treatment at the wound center Context: The wound would happen gradually Modifying Factors: Other treatment(s) tried include:surgical debridement, IV antibiotics and occupational therapy and physical therapy Associated Signs and Symptoms: Patient reports having:appropriate treatments and home health HPI Description: 84 year old patient who has been referred to [place] for the sacral decubitus ulcer was admitted to the [facility] on 07/18/2017 for an acute sacral decubitus with osteomyelitis. She was treated there  with IV antibiotics and surgical debridement was done on 08/06/2017 and transferred back to a swing bed 08/12/2017. She has completed antibiotics on 09/18/2017 and has been now living at home. Past medical history of GERD, fracture of right humerus, hypertension, rectal cancer, status post colostomy, laparoscopic cholecystectomy, debridement of sacral decubitus ulcer including subcutaneous tissue muscle and bone done on 08/06/2017, previous rectal cancer surgery in April 2017. Reviewing notes from the [facility] it was noted that she was treated for a MRSA sacral osteomyelitis and Proteus bacteremia requiring IV antibiotics which included clindamycin and vancomycin. Vancomycin had to be stopped on September 25 due to neutropenia and thrombocytopenia and changed to daptomycin until 09/20/2017. Infectious disease then advised to switch to clindamycin and the PICC line was removed on 09/22/2017. review of the electronic medical records noted that cultures of bone and tissue taken from the OR for wound debridement on 95 grew MRSA and she was started on IV antibiotics initially clindamycin and switched to IV vancomycin and the Proteus bacteremia was stained treated with by mouth levofloxacin and Flagyl for 6 weeks to end on 09/03/2017. She was also advised an offloading mattress and would follow-up with supportive care 10/17/2017 -- the patient is ambulating a bit with the help of OT and PT and she is also eating much better. She has got inappropriate air mattress in place. 11/06/2017 -- the patient is a bit more ambivalent and continues to work with her mobilization. She is not yet ready for a wound VAC. 11/20/17 she is here in follow-up evaluation of a sacral ulcer. Wound VAC has not been improved, will continue with Santyl and anticipate that she can transfer to wound VAC at next appointment, bone fragments present in wound, no indication of infection. 12/11/17 on evaluation today patient  appears to be doing very well in regard to her sacral wound. She has been tolerating the dressing changes without complication. We have still been waiting on the Wound VAC at this point.  Nonetheless patient's wound appears to be doing well. He is having no discomfort. Electronic Signature(s) Signed: 12/12/2017 8:05:24 AM By: Worthy Keeler PA-C Entered By: Worthy Keeler on 12/11/2017 16:57:41 [place], [place]. ([id number]) -------------------------------------------------------------------------------- Physical Exam Details Patient Name: [name], [name] Date of Service: 12/11/2017 3:15 PM Medical Record Number: [id number] Patient Account Number: [id number] Date of Birth/Sex: [birth year] (84 y.o. Female) Treating RN: Montey Hora Primary Care Provider: Priscille [name] Other Clinician: Referring Provider: Priscille [name] Treating Provider/Extender: STONE III, HOYT Weeks in Treatment: 9 Constitutional Well-nourished and well-hydrated in no acute distress. Respiratory normal breathing without difficulty. clear to auscultation bilaterally. Cardiovascular regular rate and rhythm with normal S1, S2. Psychiatric this patient is able to make decisions and demonstrates good insight into disease process. Alert and Oriented x 3. pleasant and cooperative. Notes Patient did have bone as well as fashion exposed in the wound bed but there does not appear to be any evidence of bone necrosis and there was no required debridement today. He did not have any discomfort on evaluation which is excellent news. Electronic Signature(s) Signed: 12/12/2017 8:05:24 AM By: Worthy Keeler PA-C Entered By: Worthy Keeler on 12/11/2017 16:58:24 [name] ([id number]) -------------------------------------------------------------------------------- Physician Orders Details Patient Name: [name], [name] Date of Service: 12/11/2017 3:15 PM Medical Record Number: [id number] Patient Account Number:  [id number] Date of Birth/Sex: 1929/12/20 (84 y.o. Female) Treating RN: Cornell Barman Primary Care Provider: Priscille [name] Other Clinician: Referring Provider: Priscille [name] Treating Provider/Extender: Melburn Hake, HOYT Weeks in Treatment: 9 Verbal / Phone Orders: No Diagnosis Coding ICD-10 Coding Code Description L89.154 Pressure ulcer of sacral region, stage 4 E44.0 Moderate protein-calorie malnutrition Z99.3 Dependence on wheelchair Wound Cleansing Wound #1 Sacrum o Clean wound with Normal Saline. o Cleanse wound with mild soap and water o May Shower, gently pat wound dry prior to applying new dressing. Skin Barriers/Peri-Wound Care Wound #1 Sacrum o Skin Prep Primary Wound Dressing Wound #1 Sacrum o Silvercel Non-Adherent Secondary Dressing Wound #1 Sacrum o Boardered Foam Dressing Dressing Change Frequency Wound #1 Sacrum o Change dressing every day. Follow-up Appointments Wound #1 Sacrum o Return Appointment in 1 week. Off-Loading Wound #1 Sacrum o Mattress - Continue air mattress o Turn and reposition every 2 hours Additional Orders / Instructions Wound #1 Sacrum o Increase protein intake. o Activity as tolerated Torpey, Crystol L. ([id number]) o Other: - Please add vitamin A, vitamin C, multivitamin and zinc supplements to your diet - available over the [place] [facility] [place] Visits - [facility]- HHRN to initiate NPWT once available and change dressing 3 times weekly o [place] Nurse may visit PRN to address patientos wound care needs. o FACE TO FACE ENCOUNTER: MEDICARE and MEDICAID PATIENTS: I certify that this patient is under my care and that I had a face-to-face encounter that meets the physician face-to-face encounter requirements with this patient on this date. The encounter with the patient was in whole or in part for the following MEDICAL CONDITION: (primary reason for [place]) MEDICAL  NECESSITY: I certify, that based on my findings, NURSING services are a medically necessary home health service. HOME BOUND STATUS: I certify that my clinical findings support that this patient is homebound (i.e., Due to illness or injury, pt requires aid of supportive devices such as crutches, cane, wheelchairs, walkers, the use of special transportation or the assistance of another person to leave their place of residence. There is a normal inability to leave the home  and doing so requires considerable and taxing effort. Other absences are for medical reasons / religious services and are infrequent or of short duration when for other reasons). o If current dressing causes regression in wound condition, may D/C ordered dressing product/s and apply Normal Saline Moist Dressing daily until next [place] / Other MD appointment. [place] of regression in wound condition at [phone]. o Please direct any NON-WOUND related issues/requests for orders to patient's Primary Care Physician Negative Pressure Wound Therapy Wound #1 Sacrum o Wound VAC settings at 125/130 mmHg continuous pressure. Use BLACK/GREEN foam to wound cavity. Use WHITE foam to fill any tunnel/s and/or undermining. Change VAC dressing 3 X WEEK. Change canister as indicated when full. Nurse may titrate settings and frequency of dressing changes as clinically indicated. - HHRN to initiate NPWT once available and change dressing 3 times weekly Electronic Signature(s) Signed: 12/11/2017 5:27:42 PM By: Gretta Cool, BSN, RN, CWS, Kim RN, BSN Signed: 12/12/2017 8:05:24 AM By: Worthy Keeler PA-C Entered By: Gretta Cool, BSN, RN, CWS, Kim on 12/11/2017 16:24:23 [name], [name] ([id number]) -------------------------------------------------------------------------------- Problem List Details Patient Name: Lankford, Andera L. Date of Service: 12/11/2017 3:15 PM Medical Record Number: [id number] Patient Account  Number: [id number] Date of Birth/Sex: October 14, 1930 (84 y.o. Female) Treating RN: Montey Hora Primary Care Provider: Priscille [name] Other Clinician: Referring Provider: Priscille [name] Treating Provider/Extender: Melburn Hake, HOYT Weeks in Treatment: 9 Active Problems ICD-10 Encounter Code Description Active Date Diagnosis L89.154 Pressure ulcer of sacral region, stage 4 10/09/2017 Yes E44.0 Moderate protein-calorie malnutrition 10/09/2017 Yes Z99.3 Dependence on wheelchair 10/09/2017 Yes Inactive Problems Resolved Problems Electronic Signature(s) Signed: 12/12/2017 8:05:24 AM By: Worthy Keeler PA-C Entered By: Worthy Keeler on 12/11/2017 16:56:53 [name], [name]. ([id number]) -------------------------------------------------------------------------------- Progress Note Details Patient Name: [name], [name] Date of Service: 12/11/2017 3:15 PM Medical Record Number: [id number] Patient Account Number: [id number] Date of Birth/Sex: 01/20/1930 (84 y.o. Female) Treating RN: Montey Hora Primary Care Provider: Priscille [name] Other Clinician: Referring Provider: Priscille [name] Treating Provider/Extender: Melburn Hake, HOYT Weeks in Treatment: 9 Subjective Chief Complaint Information obtained from Patient she is here in follow-up evaluation of a sacral ulcer History of Present Illness (HPI) The following HPI elements were documented for the patient's wound: Location: sacral pressure ulcer Quality: Patient reports No Pain. Severity: Patient states wound (s) are getting better. Duration: Patient has had the wound for > 3 months prior to seeking treatment at the wound center Context: The wound would happen gradually Modifying Factors: Other treatment(s) tried include:surgical debridement, IV antibiotics and occupational therapy and physical therapy Associated Signs and Symptoms: Patient reports having:appropriate treatments and home health 84 year old patient who has been referred to [place] for the  sacral decubitus ulcer was admitted to the [facility] on 07/18/2017 for an acute sacral decubitus with osteomyelitis. She was treated there with IV antibiotics and surgical debridement was done on 08/06/2017 and transferred back to a swing bed 08/12/2017. She has completed antibiotics on 09/18/2017 and has been now living at home. Past medical history of GERD, fracture of right humerus, hypertension, rectal cancer, status post colostomy, laparoscopic cholecystectomy, debridement of sacral decubitus ulcer including subcutaneous tissue muscle and bone done on 08/06/2017, previous rectal cancer surgery in April 2017. Reviewing notes from the [facility] it was noted that she was treated for a MRSA sacral osteomyelitis and Proteus bacteremia requiring IV antibiotics which included clindamycin and vancomycin. Vancomycin had to be stopped on September 25 due to neutropenia and thrombocytopenia and changed  to daptomycin until 09/20/2017. Infectious disease then advised to switch to clindamycin and the PICC line was removed on 09/22/2017. review of the electronic medical records noted that cultures of bone and tissue taken from the OR for wound debridement on 95 grew MRSA and she was started on IV antibiotics initially clindamycin and switched to IV vancomycin and the Proteus bacteremia was stained treated with by mouth levofloxacin and Flagyl for 6 weeks to end on 09/03/2017. She was also advised an offloading mattress and would follow-up with supportive care 10/17/2017 -- the patient is ambulating a bit with the help of OT and PT and she is also eating much better. She has got inappropriate air mattress in place. 11/06/2017 -- the patient is a bit more ambivalent and continues to work with her mobilization. She is not yet ready for a wound VAC. 11/20/17 she is here in follow-up evaluation of a sacral ulcer. Wound VAC has not been improved, will continue with Santyl and anticipate that she  can transfer to wound VAC at next appointment, bone fragments present in wound, no indication of infection. 12/11/17 on evaluation today patient appears to be doing very well in regard to her sacral wound. She has been tolerating the dressing changes without complication. We have still been waiting on the Wound VAC at this point. Nonetheless patient's wound appears to be doing well. He is having no discomfort. Patient History [name], [name] ([id number]) Information obtained from Patient. Family History Cancer - Child, No family history of Diabetes, Heart Disease, Hereditary Spherocytosis, Hypertension, Kidney Disease, Lung Disease, Seizures, Stroke, Thyroid Problems, Tuberculosis. Social History Never smoker, Marital Status - Married, Alcohol Use - Never, Drug Use - No History, Caffeine Use - Daily. Medical And Surgical History Notes Gastrointestinal colostomy since 1990 Oncologic colorectal - 1990 Review of Systems (ROS) Constitutional Symptoms (General Health) Denies complaints or symptoms of Fever, Chills. Respiratory The patient has no complaints or symptoms. Cardiovascular The patient has no complaints or symptoms. Psychiatric The patient has no complaints or symptoms. Objective Constitutional Well-nourished and well-hydrated in no acute distress. Vitals Time Taken: 3:56 PM, Height: 64 in, Weight: 200 lbs, BMI: 34.3, Pulse: 62 bpm, Respiratory Rate: 16 breaths/min, Blood Pressure: 140/78 mmHg. General Notes: . Respiratory normal breathing without difficulty. clear to auscultation bilaterally. Cardiovascular regular rate and rhythm with normal S1, S2. Psychiatric this patient is able to make decisions and demonstrates good insight into disease process. Alert and Oriented x 3. pleasant and cooperative. General Notes: Patient did have bone as well as fashion exposed in the wound bed but there does not appear to be any evidence of bone necrosis and there was no required  debridement today. He did not have any discomfort on evaluation which is excellent news. [place], [place] ([id number]) Integumentary (Hair, Skin) Wound #1 status is Open. Original cause of wound was Pressure Injury. The wound is located on the Sacrum. The wound measures 2.2cm length x 0.6cm width x 1.2cm depth; 1.037cm^2 area and 1.244cm^3 volume. There is bone, tendon, Fat Layer (Subcutaneous Tissue) Exposed, and fascia exposed. There is undermining starting at 11:00 and ending at 5:00 with a maximum distance of 3cm. There is a large amount of serous drainage noted. The wound margin is flat and intact. There is medium (34-66%) red granulation within the wound bed. There is a medium (34-66%) amount of necrotic tissue within the wound bed including Eschar and Adherent Slough. The periwound skin appearance exhibited: Scarring. The periwound skin appearance did not exhibit: Callus, Crepitus,  Excoriation, Induration, Rash, Dry/Scaly, Maceration, Atrophie Blanche, Cyanosis, Ecchymosis, Hemosiderin Staining, Mottled, Pallor, Rubor, Erythema. Periwound temperature was noted as No Abnormality. Assessment Active Problems ICD-10 L89.154 - Pressure ulcer of sacral region, stage 4 E44.0 - Moderate protein-calorie malnutrition Z99.3 - Dependence on wheelchair Plan Wound Cleansing: Wound #1 Sacrum: Clean wound with Normal Saline. Cleanse wound with mild soap and water May Shower, gently pat wound dry prior to applying new dressing. Skin Barriers/Peri-Wound Care: Wound #1 Sacrum: Skin Prep Primary Wound Dressing: Wound #1 Sacrum: Silvercel Non-Adherent Secondary Dressing: Wound #1 Sacrum: Boardered Foam Dressing Dressing Change Frequency: Wound #1 Sacrum: Change dressing every day. Follow-up Appointments: Wound #1 Sacrum: Return Appointment in 1 week. Off-Loading: Wound #1 Sacrum: Mattress - Continue air mattress Turn and reposition every 2 hours Additional Orders / Instructions: Wound  #1 Sacrum: Increase protein intake. Activity as tolerated [name], [name] ([id number]) Other: - Please add vitamin A, vitamin C, multivitamin and zinc supplements to your diet - available over the counter Home Health: Wound #1 Sacrum: [place] Visits - [facility]- HHRN to initiate NPWT once available and change dressing 3 times weekly Home Health Nurse may visit PRN to address patient s wound care needs. FACE TO FACE ENCOUNTER: MEDICARE and MEDICAID PATIENTS: I certify that this patient is under my care and that I had a face-to-face encounter that meets the physician face-to-face encounter requirements with this patient on this date. The encounter with the patient was in whole or in part for the following MEDICAL CONDITION: (primary reason for [place]) MEDICAL NECESSITY: I certify, that based on my findings, NURSING services are a medically necessary home health service. HOME BOUND STATUS: I certify that my clinical findings support that this patient is homebound (i.e., Due to illness or injury, pt requires aid of supportive devices such as crutches, cane, wheelchairs, walkers, the use of special transportation or the assistance of another person to leave their place of residence. There is a normal inability to leave the home and doing so requires considerable and taxing effort. Other absences are for medical reasons / religious services and are infrequent or of short duration when for other reasons). If current dressing causes regression in wound condition, may D/C ordered dressing product/s and apply Normal Saline Moist Dressing daily until next [place] / Other MD appointment. [place] of regression in wound condition at [phone]. Please direct any NON-WOUND related issues/requests for orders to patient's Primary Care Physician Negative Pressure Wound Therapy: Wound #1 Sacrum: Wound VAC settings at 125/130 mmHg continuous pressure. Use  BLACK/GREEN foam to wound cavity. Use WHITE foam to fill any tunnel/s and/or undermining. Change VAC dressing 3 X WEEK. Change canister as indicated when full. Nurse may titrate settings and frequency of dressing changes as clinically indicated. - HHRN to initiate NPWT once available and change dressing 3 times weekly I'm gonna recommend that we continue with the Current wound care measures until the Wound VAC is received. Hopefully this will maintain things until that time. We did get in touch with [employer] today and they are going to work on the approval and bring the Wound VAC hopefully for her next appointment for [place] to be able to apply in the office next week. Otherwise we will tin you with orders per above. Please see above for specific wound care orders. We will see patient for re-evaluation in 1 week(s) here in the clinic. If anything worsens or changes patient will contact our office for additional recommendations. Electronic  Signature(s) Signed: 12/12/2017 8:05:24 AM By: Worthy Keeler PA-C Entered By: Worthy Keeler on 12/11/2017 [name], [name] ([id number]) -------------------------------------------------------------------------------- ROS/PFSH Details Patient Name: [name], [name] Date of Service: 12/11/2017 3:15 PM Medical Record Number: [id number] Patient Account Number: [id number] Date of Birth/Sex: 1930/07/16 (84 y.o. Female) Treating RN: Montey Hora Primary Care Provider: Priscille [name] Other Clinician: Referring Provider: Priscille [name] Treating Provider/Extender: STONE III, HOYT Weeks in Treatment: 9 Information Obtained From Patient Wound History Do you currently have one or more open woundso Yes How many open wounds do you currently haveo 1 Approximately how long have you had your woundso 3 months How have you been treating your wound(s) until nowo santyl Has your wound(s) ever healed and then re-openedo No Have you had any lab work done in the past  montho Yes Who ordered the lab work [facility] hospital Have you tested positive for an antibiotic resistant organism (MRSA, VRE)o No Have you tested positive for osteomyelitis (bone infection)o Yes Have you had any tests for circulation on your legso No Constitutional Symptoms (General Health) Complaints and Symptoms: Negative for: Fever; Chills Hematologic/Lymphatic Medical History: Positive for: Anemia Negative for: Hemophilia; Human Immunodeficiency Virus; Lymphedema; Sickle Cell Disease Respiratory Complaints and Symptoms: No Complaints or Symptoms Medical History: Negative for: Aspiration; Asthma; Chronic Obstructive Pulmonary Disease (COPD); Pneumothorax; Sleep Apnea; Tuberculosis Cardiovascular Complaints and Symptoms: No Complaints or Symptoms Medical History: Positive for: Congestive Heart Failure; Hypertension Negative for: Angina; Arrhythmia; Coronary Artery Disease; Deep Vein Thrombosis; Hypotension; Myocardial Infarction; Peripheral Arterial Disease; Peripheral Venous Disease Gastrointestinal Medical History: Negative for: Cirrhosis ; Colitis; Crohnos; Hepatitis A; Hepatitis B Past Medical History Notes: [name], GAMEL. ([id number]) colostomy since 1990 Immunological Medical History: Negative for: Lupus Erythematosus; Raynaudos; Scleroderma Musculoskeletal Medical History: Positive for: Osteoarthritis; Osteomyelitis Negative for: Gout; Rheumatoid Arthritis Neurologic Medical History: Negative for: Dementia; Neuropathy; Quadriplegia Oncologic Medical History: Positive for: Received Chemotherapy; Received Radiation Past Medical History Notes: colorectal - 1990 Psychiatric Complaints and Symptoms: No Complaints or Symptoms Immunizations Pneumococcal Vaccine: Received Pneumococcal Vaccination: Yes Immunization Notes: up to date Implantable Devices Family and Social History Cancer: Yes - Child; Diabetes: No; Heart Disease: No; Hereditary  Spherocytosis: No; Hypertension: No; Kidney Disease: No; Lung Disease: No; Seizures: No; Stroke: No; Thyroid Problems: No; Tuberculosis: No; Never smoker; Marital Status - Married; Alcohol Use: Never; Drug Use: No History; Caffeine Use: Daily; Financial Concerns: No; Food, Clothing or Shelter Needs: No; Support System Lacking: No; Transportation Concerns: No; Advanced Directives: Yes (Not Provided); Patient does not want information on Advanced Directives; Living Will: Yes (Not Provided); Medical Power of Attorney: Yes - son - Shadae Reino (Not Provided) Physician Affirmation I have reviewed and agree with the above information. Electronic Signature(s) Signed: 12/12/2017 8:05:24 AM By: Worthy Keeler PA-C Signed: 12/12/2017 4:38:28 PM By: Montey Hora Entered By: Worthy Keeler on 12/11/2017 16:58:03 [place], [name] ([id number]) -------------------------------------------------------------------------------- SuperBill Details Patient Name: [name], Veyda L. Date of Service: 12/11/2017 Medical Record Number: [id number] Patient Account Number: [id number] Date of Birth/Sex: 07/19/1930 (84 y.o. Female) Treating RN: Montey Hora Primary Care Provider: Priscille [name] Other Clinician: Referring Provider: Priscille [name] Treating Provider/Extender: STONE III, HOYT Weeks in Treatment: 9 Diagnosis Coding ICD-10 Codes Code Description L89.154 Pressure ulcer of sacral region, stage 4 E44.0 Moderate protein-calorie malnutrition Z99.3 Dependence on wheelchair Facility Procedures CPT4 Code: 53664403 Description: 99213 - WOUND CARE VISIT-LEV 3 EST PT Modifier: Quantity: 1 Physician Procedures CPT4 Code: 4742595 Description: 63875 - WC PHYS LEVEL 3 - EST  PT ICD-10 Diagnosis Description L89.154 Pressure ulcer of sacral region, stage 4 E44.0 Moderate protein-calorie malnutrition Z99.3 Dependence on wheelchair Modifier: Quantity: 1 Electronic Signature(s) Signed: 12/12/2017 8:05:24 AM By:  Worthy Keeler PA-C Entered By: Worthy Keeler on 12/11/2017 16:59:28

## 2017-12-19 ENCOUNTER — Encounter: Payer: Medicare Other | Admitting: Physician Assistant

## 2017-12-19 DIAGNOSIS — L89154 Pressure ulcer of sacral region, stage 4: Secondary | ICD-10-CM | POA: Diagnosis not present

## 2017-12-22 NOTE — Progress Notes (Signed)
TERENA, BOHAN ([id number]) Visit Report for 12/19/2017 Arrival Information Details Patient Name: [name], Gricelda L. Date of Service: 12/19/2017 12:30 PM Medical Record Number: [id number] Patient Account Number: [id number] Date of Birth/Sex: Dec 18, 1929 (84 y.o. Female) Treating RN: Cornell Barman Primary Care Olene Godfrey: Priscille Kluver Other Clinician: Referring Leah Skora: Priscille Kluver Treating Wyvonne Carda/Extender: Melburn Hake, HOYT Weeks in Treatment: 10 Visit Information History Since Last Visit Added or deleted any medications: No Patient Arrived: Wheel Chair Any new allergies or adverse reactions: No Arrival Time: 12:38 Had a fall or experienced change in No activities of daily living that may affect Accompanied By: self risk of falls: Transfer Assistance: Manual Signs or symptoms of abuse/neglect since last visito No Patient Identification Verified: Yes Hospitalized since last visit: No Secondary Verification Process Completed: Yes Has Dressing in Place as Prescribed: Yes Patient Requires Transmission-Based No Pain Present Now: No Precautions: Patient Has Alerts: No Electronic Signature(s) Signed: 12/19/2017 5:36:23 PM By: Gretta Cool, BSN, RN, CWS, Kim RN, BSN Entered By: Gretta Cool, BSN, RN, CWS, Kim on 12/19/2017 12:39:24 [place], Lesle L. ([id number]) -------------------------------------------------------------------------------- Encounter Discharge Information Details Patient Name: [name], [name] Date of Service: 12/19/2017 12:30 PM Medical Record Number: [id number] Patient Account Number: [id number] Date of Birth/Sex: [birth year] (84 y.o. Female) Treating RN: Cornell Barman Primary Care Ahilyn Nell: Priscille Kluver Other Clinician: Referring Estes Lehner: Priscille Kluver Treating Izaiha Lo/Extender: Melburn Hake, HOYT Weeks in Treatment: 10 Encounter Discharge Information Items Discharge Pain Level: 0 Discharge Condition: Stable Ambulatory Status: Ambulatory Discharge Destination:  Home Private Transportation: Auto Accompanied By: caregiver Schedule Follow-up Appointment: Yes Medication Reconciliation completed and provided Yes to Patient/Care Marrell Dicaprio: Clinical Summary of Care: Electronic Signature(s) Signed: 12/19/2017 1:22:59 PM By: Gretta Cool, BSN, RN, CWS, Kim RN, BSN Entered By: Gretta Cool, BSN, RN, CWS, Kim on 12/19/2017 13:22:59 [name], [name] ([id number]) -------------------------------------------------------------------------------- Lower Extremity Assessment Details Patient Name: [name], [name] Date of Service: 12/19/2017 12:30 PM Medical Record Number: [id number] Patient Account Number: [id number] Date of Birth/Sex: [birth year] (84 y.o. Female) Treating RN: Cornell Barman Primary Care Xaiden Fleig: Priscille Kluver Other Clinician: Referring Ceaser Ebeling: Priscille Kluver Treating Sekai Nayak/Extender: Worthy Keeler Weeks in Treatment: 10 Electronic Signature(s) Signed: 12/19/2017 5:36:23 PM By: Gretta Cool, BSN, RN, CWS, Kim RN, BSN Entered By: Gretta Cool, BSN, RN, CWS, Kim on 12/19/2017 12:46:35 [place], [place] ([id number]) -------------------------------------------------------------------------------- Multi Wound Chart Details Patient Name: [name], [name] Date of Service: 12/19/2017 12:30 PM Medical Record Number: [id number] Patient Account Number: [id number] Date of Birth/Sex: July 19, 1930 (84 y.o. Female) Treating RN: Cornell Barman Primary Care Aniken Monestime: Priscille Kluver Other Clinician: Referring Caliah Kopke: Priscille Kluver Treating Cherye Gaertner/Extender: Melburn Hake, HOYT Weeks in Treatment: 10 Vital Signs Height(in): 64 Pulse(bpm): 69 Weight(lbs): 200 Blood Pressure(mmHg): 153/55 Body Mass Index(BMI): 34 Temperature(F): 97.5 Respiratory Rate 16 (breaths/min): Photos: [1:No Photos] [N/A:N/A] Wound Location: [1:Sacrum] [N/A:N/A] Wounding Event: [1:Pressure Injury] [N/A:N/A] Primary Etiology: [1:Pressure Ulcer] [N/A:N/A] Comorbid History: [1:Anemia, Congestive Heart Failure,  Hypertension, Osteoarthritis, Osteomyelitis, Received Chemotherapy, Received Radiation] [N/A:N/A] Date Acquired: [1:07/14/2017] [N/A:N/A] Weeks of Treatment: [1:10] [N/A:N/A] Wound Status: [1:Open] [N/A:N/A] Measurements L x W x D [1:2.2x0.6x1.2] [N/A:N/A] (cm) Area (cm) : [1:1.037] [N/A:N/A] Volume (cm) : [1:1.244] [N/A:N/A] % Reduction in Area: [1:8.30%] [N/A:N/A] % Reduction in Volume: [1:15.40%] [N/A:N/A] Starting Position 1 [1:10] (o'clock): Ending Position 1 [1:5] (o'clock): Maximum Distance 1 (cm): [1:3.2] Undermining: [1:Yes] [N/A:N/A] Classification: [1:Category/Stage IV] [N/A:N/A] Exudate Amount: [1:Large] [N/A:N/A] Exudate Type: [1:Serous] [N/A:N/A] Exudate Color: [1:amber] [N/A:N/A] Wound Margin: [1:Flat and Intact] [N/A:N/A] Granulation Amount: [1:Medium (34-66%)] [N/A:N/A] Granulation Quality: [1:Red] [N/A:N/A] Necrotic Amount: [1:Medium (34-66%)] [N/A:N/A] Necrotic Tissue: [1:Eschar, Adherent  Slough] [N/A:N/A] Exposed Structures: [1:Fascia: Yes Fat Layer (Subcutaneous Tissue) Exposed: Yes Tendon: Yes] [N/A:N/A] Bone: Yes Muscle: No Joint: No Epithelialization: None N/A N/A Periwound Skin Texture: Scarring: Yes N/A N/A Excoriation: No Induration: No Callus: No Crepitus: No Rash: No Periwound Skin Moisture: Maceration: No N/A N/A Dry/Scaly: No Periwound Skin Color: Atrophie Blanche: No N/A N/A Cyanosis: No Ecchymosis: No Erythema: No Hemosiderin Staining: No Mottled: No Pallor: No Rubor: No Temperature: No Abnormality N/A N/A Tenderness on Palpation: No N/A N/A Wound Preparation: Ulcer Cleansing: N/A N/A Rinsed/Irrigated with Saline Topical Anesthetic Applied: Other: lidocaine 4% Treatment Notes Electronic Signature(s) Signed: 12/19/2017 5:36:23 PM By: Gretta Cool, BSN, RN, CWS, Kim RN, BSN Entered By: Gretta Cool, BSN, RN, CWS, Kim on 12/19/2017 12:53:14 [place], [name]  ([id number]) -------------------------------------------------------------------------------- [place] Details Patient Name: [name], [name] Date of Service: 12/19/2017 12:30 PM Medical Record Number: [id number] Patient Account Number: [id number] Date of Birth/Sex: 1930-08-28 (84 y.o. Female) Treating RN: Cornell Barman Primary Care Danny Zimny: Priscille Kluver Other Clinician: Referring Dawna Jakes: Priscille Kluver Treating Jakirah Zaun/Extender: Melburn Hake, HOYT Weeks in Treatment: 10 Active Inactive ` Abuse / Safety / Falls / Self Care Management Nursing Diagnoses: Potential for falls Goals: Patient will not experience any injury related to falls Date Initiated: 10/09/2017 Target Resolution Date: 01/10/2018 Goal Status: Active Interventions: Assess fall risk on admission and as needed Notes: ` Orientation to the Wound Care Program Nursing Diagnoses: Knowledge deficit related to the wound healing center program Goals: Patient/caregiver will verbalize understanding of the [facility] [facility] Date Initiated: 10/09/2017 Target Resolution Date: 01/10/2018 Goal Status: Active Interventions: Provide education on orientation to the wound center Notes: ` Pressure Nursing Diagnoses: Potential for impaired tissue integrity related to pressure, friction, moisture, and shear Goals: Patient will remain free from development of additional pressure ulcers Date Initiated: 10/09/2017 Target Resolution Date: 01/10/2018 Goal Status: Active Interventions: Namiko, Pritts Othell L. ([id number]) Assess offloading mechanisms upon admission and as needed Provide education on pressure ulcers Notes: ` Wound/Skin Impairment Nursing Diagnoses: Impaired tissue integrity Goals: Ulcer/skin breakdown will heal within 14 weeks Date Initiated: 10/09/2017 Target Resolution Date: 01/10/2018 Goal Status: Active Interventions: Assess patient/caregiver ability to obtain necessary supplies Assess  patient/caregiver ability to perform ulcer/skin care regimen upon admission and as needed Assess ulceration(s) every visit Notes: Electronic Signature(s) Signed: 12/19/2017 5:36:23 PM By: Gretta Cool, BSN, RN, CWS, Kim RN, BSN Entered By: Gretta Cool, BSN, RN, CWS, Kim on 12/19/2017 12:53:05 Black, [place]ard ([id number]) -------------------------------------------------------------------------------- Negative Pressure Wound Therapy Application (NPWT) Details Patient Name: [name], [name] Date of Service: 12/19/2017 12:30 PM Medical Record Number: [id number] Patient Account Number: [id number] Date of Birth/Sex: 08-Feb-1930 (84 y.o. Female) Treating RN: Cornell Barman Primary Care Zaniel Marineau: Priscille Kluver Other Clinician: Referring Latroya Ng: Priscille Kluver Treating Krithi Bray/Extender: STONE III, HOYT Weeks in Treatment: 10 NPWT Application Performed for: Wound #1 Sacrum Performed By: Cornell Barman, RN Type: VAC System Coverage Size (sq cm): 1.32 Pressure Type: Constant Pressure Setting: 125 mmHG Drain Type: None Primary Contact: Non-Adherent Quantity of Sponges/Gauze Inserted: 2 Sponge/Dressing Type: Foam, Black Date Initiated: 12/19/2017 Response to Treatment: tolerated well Post Procedure Diagnosis Same as Pre-procedure Electronic Signature(s) Signed: 12/19/2017 1:21:07 PM By: Gretta Cool, BSN, RN, CWS, Kim RN, BSN Entered By: Gretta Cool, BSN, RN, CWS, Kim on 12/19/2017 13:21:07 [place], Damali L. ([id number]) -------------------------------------------------------------------------------- Pain Assessment Details Patient Name: [name], [name] Date of Service: 12/19/2017 12:30 PM Medical Record Number: [id number] Patient Account Number: [id number] Date of Birth/Sex: 04-11-1930 (84 y.o. Female) Treating RN: Cornell Barman Primary Care Yoshi Vicencio: Priscille Kluver  Other Clinician: Referring Tyjai Charbonnet: DAVIS, DEBRA Treating Kilyn Maragh/Extender: STONE III, HOYT Weeks in Treatment: 10 Active Problems Location of Pain Severity  and Description of Pain Patient Has Paino No Site Locations With Dressing Change: No Pain Management and Medication Current Pain Management: Goals for Pain Management Topical or injectable lidocaine is offered to patient for acute pain when surgical debridement is performed. If needed, Patient is instructed to use over the counter pain medication for the following 24-48 hours after debridement. Wound care MDs do not prescribed pain medications. Patient has chronic pain or uncontrolled pain. Patient has been instructed to make an appointment with their Primary Care Physician for pain management. Electronic Signature(s) Signed: 12/19/2017 5:36:23 PM By: Gretta Cool, BSN, RN, CWS, Kim RN, BSN Entered By: Gretta Cool, BSN, RN, CWS, Kim on 12/19/2017 12:39:54 [place], [name] ([id number]) -------------------------------------------------------------------------------- Patient/Caregiver Education Details Patient Name: [name], [name] Date of Service: 12/19/2017 12:30 PM Medical Record Number: [id number] Patient Account Number: [id number] Date of Birth/Gender: 06/07/1930 (84 y.o. Female) Treating RN: Cornell Barman Primary Care Physician: Priscille Kluver Other Clinician: Referring Physician: Priscille Kluver Treating Physician/Extender: Sharalyn Ink in Treatment: 10 Education Assessment Education Provided To: Patient Education Topics Provided Wound/Skin Impairment: Handouts: Caring for Your Ulcer Methods: Demonstration, Explain/Verbal Responses: State content correctly Electronic Signature(s) Signed: 12/19/2017 5:36:23 PM By: Gretta Cool, BSN, RN, CWS, Kim RN, BSN Entered By: Gretta Cool, BSN, RN, CWS, Kim on 12/19/2017 13:23:18 [name], [name] ([id number]) -------------------------------------------------------------------------------- Wound Assessment Details Patient Name: [name], [name] Date of Service: 12/19/2017 12:30 PM Medical Record Number: [id number] Patient Account Number: [id number] Date of  Birth/Sex: 07/05/1930 (84 y.o. Female) Treating RN: Cornell Barman Primary Care Addasyn Mcbreen: Priscille Kluver Other Clinician: Referring Jacier Gladu: Priscille Kluver Treating Aunika Kirsten/Extender: STONE III, HOYT Weeks in Treatment: 10 Wound Status Wound Number: 1 Primary Pressure Ulcer Etiology: Wound Location: Sacrum Wound Open Wounding Event: Pressure Injury Status: Date Acquired: 07/14/2017 Comorbid Anemia, Congestive Heart Failure, Weeks Of Treatment: 10 History: Hypertension, Osteoarthritis, Osteomyelitis, Clustered Wound: No Received Chemotherapy, Received Radiation Photos Photo Uploaded By: Gretta Cool, BSN, RN, CWS, Kim on 12/19/2017 13:30:03 Wound Measurements Length: (cm) 2.2 % Reducti Width: (cm) 0.6 % Reducti Depth: (cm) 1.2 Epithelia Area: (cm) 1.037 Tunnelin Volume: (cm) 1.244 Undermin Starti Ending Maximu on in Area: 8.3% on in Volume: 15.4% lization: None g: No ing: Yes ng Position (o'clock): 10 Position (o'clock): 5 m Distance: (cm) 3.2 Wound Description Classification: Category/Stage IV Foul Odor Wound Margin: Flat and Intact Slough/Fi Exudate Amount: Large Exudate Type: Serous Exudate Color: amber After Cleansing: No brino Yes Wound Bed Granulation Amount: Medium (34-66%) Exposed Structure Granulation Quality: Red Fascia Exposed: Yes Necrotic Amount: Medium (34-66%) Fat Layer (Subcutaneous Tissue) Exposed: Yes Necrotic Quality: Eschar, Adherent Slough Tendon Exposed: Yes Muscle Exposed: No Joint Exposed: No Bone Exposed: Yes [name], [name] ([id number]) Periwound Skin Texture Texture Color No Abnormalities Noted: No No Abnormalities Noted: No Callus: No Atrophie Blanche: No Crepitus: No Cyanosis: No Excoriation: No Ecchymosis: No Induration: No Erythema: No Rash: No Hemosiderin Staining: No Scarring: Yes Mottled: No Pallor: No Moisture Rubor: No No Abnormalities Noted: No Dry / Scaly: No Temperature / Pain Maceration: No Temperature: No  Abnormality Wound Preparation Ulcer Cleansing: Rinsed/Irrigated with Saline Topical Anesthetic Applied: Other: lidocaine 4%, Treatment Notes Wound #1 (Sacrum) 1. Cleansed with: Clean wound with Normal Saline 2. Anesthetic Topical Lidocaine 4% cream to wound bed prior to debridement 4. Dressing Applied: Contact layer 8. Negative Pressure Wound Therapy Wound Vac to wound continuously at 14mm/hg pressure Black Foam Apply contact  layer over base of wound. Number of foam/gauze pieces used in the dressing = Change canister as needed. Electronic Signature(s) Signed: 12/19/2017 5:36:23 PM By: Gretta Cool, BSN, RN, CWS, Kim RN, BSN Entered By: Gretta Cool, BSN, RN, CWS, Kim on 12/19/2017 12:46:06 [place], [place] ([id number]) -------------------------------------------------------------------------------- Vitals Details Patient Name: [name], Kamron L. Date of Service: 12/19/2017 12:30 PM Medical Record Number: [id number] Patient Account Number: [id number] Date of Birth/Sex: 23-Sep-1930 (84 y.o. Female) Treating RN: Cornell Barman Primary Care Deric Bocock: Priscille Kluver Other Clinician: Referring Earlie Schank: Priscille Kluver Treating Jane Broughton/Extender: Melburn Hake, HOYT Weeks in Treatment: 10 Vital Signs Time Taken: 12:39 Temperature (F): 97.5 Height (in): 64 Pulse (bpm): 57 Weight (lbs): 200 Respiratory Rate (breaths/min): 16 Body Mass Index (BMI): 34.3 Blood Pressure (mmHg): 153/55 Reference Range: 80 - 120 mg / dl Electronic Signature(s) Signed: 12/19/2017 5:36:23 PM By: Gretta Cool, BSN, RN, CWS, Kim RN, BSN Entered By: Gretta Cool, BSN, RN, CWS, Kim on 12/19/2017 12:40:11

## 2017-12-22 NOTE — Progress Notes (Signed)
TIWATOPE, EMMITT ([id number]) Visit Report for 12/19/2017 Chief Complaint Document Details Patient Name: [name], [name]. Date of Service: 12/19/2017 12:30 PM Medical Record Number: [id number] Patient Account Number: [id number] Date of Birth/Sex: 1930/06/04 (84 y.o. Female) Treating RN: Cornell Barman Primary Care Provider: Priscille Kluver Other Clinician: Referring Provider: Priscille Kluver Treating Provider/Extender: Melburn Hake, Copelan Maultsby Weeks in Treatment: 10 Information Obtained from: Patient Chief Complaint she is here in follow-up evaluation of a sacral ulcer Electronic Signature(s) Signed: 12/22/2017 11:19:49 AM By: Worthy Keeler PA-C Entered By: Worthy Keeler on 12/19/2017 12:49:02 [place], [place]. ([id number]) -------------------------------------------------------------------------------- HPI Details Patient Name: [name], [name]. Date of Service: 12/19/2017 12:30 PM Medical Record Number: [id number] Patient Account Number: [id number] Date of Birth/Sex: [birth year] (84 y.o. Female) Treating RN: Cornell Barman Primary Care Provider: Priscille Kluver Other Clinician: Referring Provider: Priscille Kluver Treating Provider/Extender: Melburn Hake, Nella Botsford Weeks in Treatment: 10 History of Present Illness HPI Description: 84 year old patient who has been referred to [place] for the sacral decubitus ulcer was admitted to the [facility] on 07/18/2017 for an acute sacral decubitus with osteomyelitis. She was treated there with IV antibiotics and surgical debridement was done on 08/06/2017 and transferred back to a swing bed 08/12/2017. She has completed antibiotics on 09/18/2017 and has been now living at home. Past medical history of GERD, fracture of right humerus, hypertension, rectal cancer, status post colostomy, laparoscopic cholecystectomy, debridement of sacral decubitus ulcer including subcutaneous tissue muscle and bone done on 08/06/2017, previous rectal cancer surgery in April 2017. Reviewing  notes from the [facility] it was noted that she was treated for a MRSA sacral osteomyelitis and Proteus bacteremia requiring IV antibiotics which included clindamycin and vancomycin. Vancomycin had to be stopped on September 25 due to neutropenia and thrombocytopenia and changed to daptomycin until 09/20/2017. Infectious disease then advised to switch to clindamycin and the PICC line was removed on 09/22/2017. review of the electronic medical records noted that cultures of bone and tissue taken from the OR for wound debridement on 95 grew MRSA and she was started on IV antibiotics initially clindamycin and switched to IV vancomycin and the Proteus bacteremia was stained treated with by mouth levofloxacin and Flagyl for 6 weeks to end on 09/03/2017. She was also advised an offloading mattress and would follow-up with supportive care 10/17/2017 -- the patient is ambulating a bit with the help of OT and PT and she is also eating much better. She has got inappropriate air mattress in place. 11/06/2017 -- the patient is a bit more ambivalent and continues to work with her mobilization. She is not yet ready for a wound VAC. 11/20/17 she is here in follow-up evaluation of a sacral ulcer. Wound VAC has not been improved, will continue with Santyl and anticipate that she can transfer to wound VAC at next appointment, bone fragments present in wound, no indication of infection. 12/19/17 on evaluation today patient sacral wound appears to be doing decently well. She still has some bone exposure although this is minimal compared to previous in fact it just feels rough along the region of the bone or at least where this was this appears to have covered over with granulation for the most part. Nonetheless the wound measurements otherwise have not really significantly changed. We are going to be initiating the Wound VAC today. No fevers, chills, nausea, or vomiting noted at this time. Electronic  Signature(s) Signed: 12/22/2017 11:19:49 AM By: Worthy Keeler PA-C Entered By: Worthy Keeler on 12/19/2017 13:21:37  CUTTINO, Paloma [name]. ([id number]) -------------------------------------------------------------------------------- Physical Exam Details Patient Name: [name], [name]. Date of Service: 12/19/2017 12:30 PM Medical Record Number: [id number] Patient Account Number: [id number] Date of Birth/Sex: [birth year] (84 y.o. Female) Treating RN: Cornell Barman Primary Care Provider: Priscille Kluver Other Clinician: Referring Provider: Priscille Kluver Treating Provider/Extender: STONE III, Ehsan Corvin Weeks in Treatment: 10 Constitutional Well-nourished and well-hydrated in no acute distress. Respiratory normal breathing without difficulty. clear to auscultation bilaterally. Cardiovascular regular rate and rhythm with normal S1, S2. Psychiatric this patient is able to make decisions and demonstrates good insight into disease process. Alert and Oriented x 3. pleasant and cooperative. Notes Patient's wound bed shows a granular surface with some fascia exposed as well as little bit of bone although this is minimal at this point. There is no evidence of infection and no slough required debridement today. Electronic Signature(s) Signed: 12/22/2017 11:19:49 AM By: Worthy Keeler PA-C Entered By: Worthy Keeler on 12/19/2017 13:22:23 [place], Seba LMarland Kitchen ([id number]) -------------------------------------------------------------------------------- Physician Orders Details Patient Name: [name], [name]. Date of Service: 12/19/2017 12:30 PM Medical Record Number: [id number] Patient Account Number: [id number] Date of Birth/Sex: 09/25/1930 (84 y.o. Female) Treating RN: Cornell Barman Primary Care Provider: Priscille Kluver Other Clinician: Referring Provider: Priscille Kluver Treating Provider/Extender: Melburn Hake, Araina Butrick Weeks in Treatment: 10 Verbal / Phone Orders: No Diagnosis Coding ICD-10 Coding Code  Description L89.154 Pressure ulcer of sacral region, stage 4 E44.0 Moderate protein-calorie malnutrition Z99.3 Dependence on wheelchair Wound Cleansing Wound #1 Sacrum o Clean wound with Normal Saline. o Cleanse wound with mild soap and water o May Shower, gently pat wound dry prior to applying new dressing. Skin Barriers/Peri-Wound Care Wound #1 Sacrum o Skin Prep Primary Wound Dressing Wound #1 Sacrum o Mepitel One Contact layer - Over bone in wound bed Dressing Change Frequency Wound #1 Sacrum o Change Dressing Monday, Wednesday, Friday Follow-up Appointments Wound #1 Sacrum o Return Appointment in 1 week. Off-Loading Wound #1 Sacrum o Mattress - Continue air mattress o Turn and reposition every 2 hours Additional Orders / Instructions Wound #1 Sacrum o Increase protein intake. o Activity as tolerated o Other: - Please add vitamin A, vitamin C, multivitamin and zinc supplements to your diet - available over the counter [place] #1 Anaelle Dunton, [place] ([id number]) o [place] Visits - [place] change NPWT dressing 3 times weekly o [place] Nurse may visit PRN to address patientos wound care needs. o FACE TO FACE ENCOUNTER: MEDICARE and MEDICAID PATIENTS: I certify that this patient is under my care and that I had a face-to-face encounter that meets the physician face-to-face encounter requirements with this patient on this date. The encounter with the patient was in whole or in part for the following MEDICAL CONDITION: (primary reason for [place]) MEDICAL NECESSITY: I certify, that based on my findings, NURSING services are a medically necessary home health service. HOME BOUND STATUS: I certify that my clinical findings support that this patient is homebound (i.e., Due to illness or injury, pt requires aid of supportive devices such as crutches, cane, wheelchairs, walkers, the use of special  transportation or the assistance of another person to leave their place of residence. There is a normal inability to leave the home and doing so requires considerable and taxing effort. Other absences are for medical reasons / religious services and are infrequent or of short duration when for other reasons). o If current dressing causes regression in wound condition, may D/C ordered dressing product/s and apply  Normal Saline Moist Dressing daily until next [place] / Other MD appointment. [place] of regression in wound condition at [phone]. o Please direct any NON-WOUND related issues/requests for orders to patient's Primary Care Physician Negative Pressure Wound Therapy Wound #1 Sacrum o Wound VAC settings at 125/130 mmHg continuous pressure. Use BLACK/GREEN foam to wound cavity. Use WHITE foam to fill any tunnel/s and/or undermining. Change VAC dressing 3 X WEEK. Change canister as indicated when full. Nurse may titrate settings and frequency of dressing changes as clinically indicated. - HHRN to change NPWT dressing 3 times weekly o Home Health Nurse may d/c VAC for s/s of increased infection, significant wound regression, or uncontrolled drainage. [place] at [phone]. o Apply contact layer over base of wound. o Number of foam/gauze pieces used in the dressing = - 2 Electronic Signature(s) Signed: 12/19/2017 5:36:23 PM By: Gretta Cool, BSN, RN, CWS, Kim RN, BSN Signed: 12/22/2017 11:19:49 AM By: Worthy Keeler PA-C Entered By: Gretta Cool, BSN, RN, CWS, Kim on 12/19/2017 13:20:09 [place], [place]. ([id number]) -------------------------------------------------------------------------------- Problem List Details Patient Name: [name], [name]. Date of Service: 12/19/2017 12:30 PM Medical Record Number: [id number] Patient Account Number: [id number] Date of Birth/Sex: [birth year] (84 y.o. Female) Treating RN: Cornell Barman Primary  Care Provider: Priscille Kluver Other Clinician: Referring Provider: Priscille Kluver Treating Provider/Extender: Melburn Hake, Deiondra Denley Weeks in Treatment: 10 Active Problems ICD-10 Encounter Code Description Active Date Diagnosis L89.154 Pressure ulcer of sacral region, stage 4 10/09/2017 Yes E44.0 Moderate protein-calorie malnutrition 10/09/2017 Yes Z99.3 Dependence on wheelchair 10/09/2017 Yes Inactive Problems Resolved Problems Electronic Signature(s) Signed: 12/22/2017 11:19:49 AM By: Worthy Keeler PA-C Entered By: Worthy Keeler on 12/19/2017 12:48:56 [place], [place] ([id number]) -------------------------------------------------------------------------------- Progress Note Details Patient Name: [name], [name]. Date of Service: 12/19/2017 12:30 PM Medical Record Number: [id number] Patient Account Number: [id number] Date of Birth/Sex: 12-19-1929 (84 y.o. Female) Treating RN: Cornell Barman Primary Care Provider: Priscille Kluver Other Clinician: Referring Provider: Priscille Kluver Treating Provider/Extender: Melburn Hake, Tramayne Sebesta Weeks in Treatment: 10 Subjective Chief Complaint Information obtained from Patient she is here in follow-up evaluation of a sacral ulcer History of Present Illness (HPI) 84 year old patient who has been referred to [place] for the sacral decubitus ulcer was admitted to the [facility] on 07/18/2017 for an acute sacral decubitus with osteomyelitis. She was treated there with IV antibiotics and surgical debridement was done on 08/06/2017 and transferred back to a swing bed 08/12/2017. She has completed antibiotics on 09/18/2017 and has been now living at home. Past medical history of GERD, fracture of right humerus, hypertension, rectal cancer, status post colostomy, laparoscopic cholecystectomy, debridement of sacral decubitus ulcer including subcutaneous tissue muscle and bone done on 08/06/2017, previous rectal cancer surgery in April 2017. Reviewing notes from the [facility] it was noted that she was treated for a MRSA sacral osteomyelitis and Proteus bacteremia requiring IV antibiotics which included clindamycin and vancomycin. Vancomycin had to be stopped on September 25 due to neutropenia and thrombocytopenia and changed to daptomycin until 09/20/2017. Infectious disease then advised to switch to clindamycin and the PICC line was removed on 09/22/2017. review of the electronic medical records noted that cultures of bone and tissue taken from the OR for wound debridement on 95 grew MRSA and she was started on IV antibiotics initially clindamycin and switched to IV vancomycin and the Proteus bacteremia was stained treated with by mouth levofloxacin and Flagyl for 6 weeks to end on 09/03/2017. She was also  advised an offloading mattress and would follow-up with supportive care 10/17/2017 -- the patient is ambulating a bit with the help of OT and PT and she is also eating much better. She has got inappropriate air mattress in place. 11/06/2017 -- the patient is a bit more ambivalent and continues to work with her mobilization. She is not yet ready for a wound VAC. 11/20/17 she is here in follow-up evaluation of a sacral ulcer. Wound VAC has not been improved, will continue with Santyl and anticipate that she can transfer to wound VAC at next appointment, bone fragments present in wound, no indication of infection. 12/19/17 on evaluation today patient sacral wound appears to be doing decently well. She still has some bone exposure although this is minimal compared to previous in fact it just feels rough along the region of the bone or at least where this was this appears to have covered over with granulation for the most part. Nonetheless the wound measurements otherwise have not really significantly changed. We are going to be initiating the Wound VAC today. No fevers, chills, nausea, or vomiting noted at this time. Patient History Information obtained from  Patient. Family History Cancer - Child, No family history of Diabetes, Heart Disease, Hereditary Spherocytosis, Hypertension, Kidney Disease, Lung Disease, Seizures, Stroke, Thyroid Problems, Tuberculosis. Social History [name], [name] ([id number]) Never smoker, Marital Status - Married, Alcohol Use - Never, Drug Use - No History, Caffeine Use - Daily. Medical And Surgical History Notes Gastrointestinal colostomy since 1990 Oncologic colorectal - 1990 Review of Systems (ROS) Constitutional Symptoms (General Health) Denies complaints or symptoms of Fever, Chills. Respiratory The patient has no complaints or symptoms. Cardiovascular The patient has no complaints or symptoms. Psychiatric The patient has no complaints or symptoms. Objective Constitutional Well-nourished and well-hydrated in no acute distress. Vitals Time Taken: 12:39 PM, Height: 64 in, Weight: 200 lbs, BMI: 34.3, Temperature: 97.5 F, Pulse: 57 bpm, Respiratory Rate: 16 breaths/min, Blood Pressure: 153/55 mmHg. Respiratory normal breathing without difficulty. clear to auscultation bilaterally. Cardiovascular regular rate and rhythm with normal S1, S2. Psychiatric this patient is able to make decisions and demonstrates good insight into disease process. Alert and Oriented x 3. pleasant and cooperative. General Notes: Patient's wound bed shows a granular surface with some fascia exposed as well as little bit of bone although this is minimal at this point. There is no evidence of infection and no slough required debridement today. Integumentary (Hair, Skin) Wound #1 status is Open. Original cause of wound was Pressure Injury. The wound is located on the Sacrum. The wound measures 2.2cm length x 0.6cm width x 1.2cm depth; 1.037cm^2 area and 1.244cm^3 volume. There is bone, tendon, Fat Layer (Subcutaneous Tissue) Exposed, and fascia exposed. There is no tunneling noted, however, there is undermining starting at  10:00 and ending at 5:00 with a maximum distance of 3.2cm. There is a large amount of serous drainage noted. The wound margin is flat and intact. There is medium (34-66%) red granulation within the wound bed. There is a medium (34- 66%) amount of necrotic tissue within the wound bed including Eschar and Adherent Slough. The periwound skin appearance exhibited: Scarring. The periwound skin appearance did not exhibit: Callus, Crepitus, Excoriation, Induration, Rash, Dry/Scaly, Maceration, Atrophie Blanche, Cyanosis, Ecchymosis, Hemosiderin Staining, Mottled, Pallor, Rubor, Erythema. Periwound Gramm, Brissia [name]. ([id number]) temperature was noted as No Abnormality. Assessment Active Problems ICD-10 L89.154 - Pressure ulcer of sacral region, stage 4 E44.0 - Moderate protein-calorie malnutrition Z99.3 - Dependence on wheelchair Plan  Wound Cleansing: Wound #1 Sacrum: Clean wound with Normal Saline. Cleanse wound with mild soap and water May Shower, gently pat wound dry prior to applying new dressing. Skin Barriers/Peri-Wound Care: Wound #1 Sacrum: Skin Prep Primary Wound Dressing: Wound #1 Sacrum: Mepitel One Contact layer - Over bone in wound bed Dressing Change Frequency: Wound #1 Sacrum: Change Dressing Monday, Wednesday, Friday Follow-up Appointments: Wound #1 Sacrum: Return Appointment in 1 week. Off-Loading: Wound #1 Sacrum: Mattress - Continue air mattress Turn and reposition every 2 hours Additional Orders / Instructions: Wound #1 Sacrum: Increase protein intake. Activity as tolerated Other: - Please add vitamin A, vitamin C, multivitamin and zinc supplements to your diet - available over the counter Home Health: Wound #1 Sacrum: [place] Visits - [place] change NPWT dressing 3 times weekly Home Health Nurse may visit PRN to address patient s wound care needs. FACE TO FACE ENCOUNTER: MEDICARE and MEDICAID PATIENTS: I certify that this patient is under  my care and that I had a face-to-face encounter that meets the physician face-to-face encounter requirements with this patient on this date. The encounter with the patient was in whole or in part for the following MEDICAL CONDITION: (primary reason for [place]) MEDICAL NECESSITY: I certify, that based on my findings, NURSING services are a medically necessary home health service. HOME BOUND STATUS: I certify that my clinical findings support that this patient is homebound (i.e., Due to illness or injury, pt requires aid of supportive devices such as crutches, cane, wheelchairs, walkers, the use of special transportation or the assistance of another person to leave their place of residence. There is a normal inability to leave the [place], [place] ([id number]) home and doing so requires considerable and taxing effort. Other absences are for medical reasons / religious services and are infrequent or of short duration when for other reasons). If current dressing causes regression in wound condition, may D/C ordered dressing product/s and apply Normal Saline Moist Dressing daily until next [place] / Other MD appointment. [place] of regression in wound condition at [phone]. Please direct any NON-WOUND related issues/requests for orders to patient's Primary Care Physician Negative Pressure Wound Therapy: Wound #1 Sacrum: Wound VAC settings at 125/130 mmHg continuous pressure. Use BLACK/GREEN foam to wound cavity. Use WHITE foam to fill any tunnel/s and/or undermining. Change VAC dressing 3 X WEEK. Change canister as indicated when full. Nurse may titrate settings and frequency of dressing changes as clinically indicated. - HHRN to change NPWT dressing 3 times weekly Home Health Nurse may d/c VAC for s/s of increased infection, significant wound regression, or uncontrolled drainage. [place] at [phone]. Apply contact layer over base  of wound. Number of foam/gauze pieces used in the dressing = - 2 I'm going to recommend that we go ahead and proceed forward with the Wound VAC at this time. Patient did ask about how long I thought she would be utilizing the Wound VAC. I explained that I cannot give a definite time although what we are looking for is a 10% decrease in wound size monthly. Obviously we will see were things stand in a month and see if we need to continue or discontinue the Wound VAC based on progression. She is in agreement with this plan. Otherwise we will see her for reevaluation in one weeks time. Please see above for specific wound care orders. We will see patient for re-evaluation in 1 week(s) here in the clinic. If anything  worsens or changes patient will contact our office for additional recommendations. Electronic Signature(s) Signed: 12/22/2017 11:19:49 AM By: Worthy Keeler PA-C Entered By: Worthy Keeler on 12/19/2017 13:23:21 [place], [name]. ([id number]) -------------------------------------------------------------------------------- ROS/PFSH Details Patient Name: [name], Michelyn [name]. Date of Service: 12/19/2017 12:30 PM Medical Record Number: [id number] Patient Account Number: [id number] Date of Birth/Sex: [birth year] (84 y.o. Female) Treating RN: Cornell Barman Primary Care Provider: Priscille Kluver Other Clinician: Referring Provider: Priscille Kluver Treating Provider/Extender: Melburn Hake, Meshach Perry Weeks in Treatment: 10 Information Obtained From Patient Wound History Do you currently have one or more open woundso Yes How many open wounds do you currently haveo 1 Approximately how long have you had your woundso 3 months How have you been treating your wound(s) until nowo santyl Has your wound(s) ever healed and then re-openedo No Have you had any lab work done in the past montho Yes Who ordered the lab work [facility] hospital Have you tested positive for an antibiotic resistant organism (MRSA, VRE)o  No Have you tested positive for osteomyelitis (bone infection)o Yes Have you had any tests for circulation on your legso No Constitutional Symptoms (General Health) Complaints and Symptoms: Negative for: Fever; Chills Hematologic/Lymphatic Medical History: Positive for: Anemia Negative for: Hemophilia; Human Immunodeficiency Virus; Lymphedema; Sickle Cell Disease Respiratory Complaints and Symptoms: No Complaints or Symptoms Medical History: Negative for: Aspiration; Asthma; Chronic Obstructive Pulmonary Disease (COPD); Pneumothorax; Sleep Apnea; Tuberculosis Cardiovascular Complaints and Symptoms: No Complaints or Symptoms Medical History: Positive for: Congestive Heart Failure; Hypertension Negative for: Angina; Arrhythmia; Coronary Artery Disease; Deep Vein Thrombosis; Hypotension; Myocardial Infarction; Peripheral Arterial Disease; Peripheral Venous Disease Gastrointestinal Medical History: Negative for: Cirrhosis ; Colitis; Crohnos; Hepatitis A; Hepatitis B Past Medical History Notes: VIRGINA, DEAKINS. ([id number]) colostomy since 1990 Immunological Medical History: Negative for: Lupus Erythematosus; Raynaudos; Scleroderma Musculoskeletal Medical History: Positive for: Osteoarthritis; Osteomyelitis Negative for: Gout; Rheumatoid Arthritis Neurologic Medical History: Negative for: Dementia; Neuropathy; Quadriplegia Oncologic Medical History: Positive for: Received Chemotherapy; Received Radiation Past Medical History Notes: colorectal - 1990 Psychiatric Complaints and Symptoms: No Complaints or Symptoms Immunizations Pneumococcal Vaccine: Received Pneumococcal Vaccination: Yes Immunization Notes: up to date Implantable Devices Family and Social History Cancer: Yes - Child; Diabetes: No; Heart Disease: No; Hereditary Spherocytosis: No; Hypertension: No; Kidney Disease: No; Lung Disease: No; Seizures: No; Stroke: No; Thyroid Problems: No; Tuberculosis: No;  Never smoker; Marital Status - Married; Alcohol Use: Never; Drug Use: No History; Caffeine Use: Daily; Financial Concerns: No; Food, Clothing or Shelter Needs: No; Support System Lacking: No; Transportation Concerns: No; Advanced Directives: Yes (Not Provided); Patient does not want information on Advanced Directives; Living Will: Yes (Not Provided); Medical Power of Attorney: Yes - son - Adilenne Ashworth (Not Provided) Physician Affirmation I have reviewed and agree with the above information. Electronic Signature(s) Signed: 12/19/2017 5:36:23 PM By: Gretta Cool, BSN, RN, CWS, Kim RN, BSN Signed: 12/22/2017 11:19:49 AM By: Worthy Keeler PA-C Entered By: Worthy Keeler on 12/19/2017 13:21:57 Asman, Yaiza [name]. ([id number]) -------------------------------------------------------------------------------- SuperBill Details Patient Name: [name], Meela [name]. Date of Service: 12/19/2017 Medical Record Number: [id number] Patient Account Number: [id number] Date of Birth/Sex: 02/02/1930 (84 y.o. Female) Treating RN: Cornell Barman Primary Care Provider: Priscille Kluver Other Clinician: Referring Provider: Priscille Kluver Treating Provider/Extender: Melburn Hake, Raylen Ken Weeks in Treatment: 10 Diagnosis Coding ICD-10 Codes Code Description L89.154 Pressure ulcer of sacral region, stage 4 E44.0 Moderate protein-calorie malnutrition Z99.3 Dependence on wheelchair Facility Procedures CPT4 Code: [id number] Description: 88502 - WOUND VAC-50 SQ CM OR LESS  ICD-10 Diagnosis Description L89.154 Pressure ulcer of sacral region, stage 4 Modifier: Quantity: 1 Physician Procedures CPT4 Code: [id number] Description: 31121 - WC PHYS LEVEL 3 - EST PT ICD-10 Diagnosis Description L89.154 Pressure ulcer of sacral region, stage 4 E44.0 Moderate protein-calorie malnutrition Z99.3 Dependence on wheelchair Modifier: Quantity: 1 CPT4 Code: [id number] Description: 07225 - WC PHYS TX WOUND VAC < 50 SQ CM ICD-10 Diagnosis Description L89.154  Pressure ulcer of sacral region, stage 4 Modifier: Quantity: 1 Electronic Signature(s) Signed: 12/22/2017 11:19:49 AM By: Worthy Keeler PA-C Entered By: Worthy Keeler on 12/19/2017 13:24:17

## 2017-12-25 ENCOUNTER — Encounter: Payer: Medicare Other | Admitting: Nurse Practitioner

## 2017-12-25 DIAGNOSIS — L89154 Pressure ulcer of sacral region, stage 4: Secondary | ICD-10-CM | POA: Diagnosis not present

## 2017-12-26 NOTE — Progress Notes (Signed)
[name] ([id number]) Visit Report for 12/25/2017 Chief Complaint Document Details Patient Name: [name], [name] Date of Service: 12/25/2017 12:30 PM Medical Record Number: [id number] Patient Account Number: [id number] Date of Birth/Sex: [birth year] (84 y.o. Female) Treating RN: Roger Shelter Primary Care Provider: Priscille Kluver Other Clinician: Referring Provider: Priscille Kluver Treating Provider/Extender: Cathie Olden in Treatment: 11 Information Obtained from: Patient Chief Complaint she is here in follow-up evaluation of a sacral ulcer Electronic Signature(s) Signed: 12/25/2017 4:11:02 PM By: Lawanda Cousins Entered By: Lawanda Cousins on 12/25/2017 13:53:04 [place], [place] ([id number]) -------------------------------------------------------------------------------- Debridement Details Patient Name: [name], [name] Date of Service: 12/25/2017 12:30 PM Medical Record Number: [id number] Patient Account Number: [id number] Date of Birth/Sex: Aug 22, 1930 (84 y.o. Female) Treating RN: Roger Shelter Primary Care Provider: Priscille Kluver Other Clinician: Referring Provider: Priscille Kluver Treating Provider/Extender: Cathie Olden in Treatment: 11 Debridement Performed for Wound #1 Sacrum Assessment: Performed By: Physician Lawanda Cousins, NP Debridement: Debridement Pre-procedure Verification/Time Yes - 01:25 Out Taken: Start Time: 01:25 Pain Control: Other : lidocaine 4% Level: Skin/Subcutaneous Tissue Total Area Debrided (L x W): 2.2 (cm) x 1.2 (cm) = 2.64 (cm) Tissue and other material Viable, Non-Viable, Fat, Fibrin/Slough, Skin, Subcutaneous debrided: Instrument: Curette Bleeding: Minimum Hemostasis Achieved: Pressure End Time: 01:26 Procedural Pain: 0 Post Procedural Pain: 0 Response to Treatment: Procedure was tolerated well Post Debridement Measurements of Total Wound Length: (cm) 2.2 Stage: Category/Stage IV Width: (cm) 1.2 Depth: (cm)  1.3 Volume: (cm) 2.695 Character of Wound/Ulcer Post Stable Debridement: Post Procedure Diagnosis Same as Pre-procedure Electronic Signature(s) Signed: 12/25/2017 4:11:02 PM By: Lawanda Cousins Signed: 12/25/2017 4:47:24 PM By: Roger Shelter Entered By: Lawanda Cousins on 12/25/2017 13:52:51 [place], [name] ([id number]) -------------------------------------------------------------------------------- HPI Details Patient Name: [name], [name] Date of Service: 12/25/2017 12:30 PM Medical Record Number: [id number] Patient Account Number: [id number] Date of Birth/Sex: [birth year] (84 y.o. Female) Treating RN: Roger Shelter Primary Care Provider: Priscille Kluver Other Clinician: Referring Provider: Priscille Kluver Treating Provider/Extender: Cathie Olden in Treatment: 11 History of Present Illness HPI Description: 84 year old patient who has been referred to [place] for the sacral decubitus ulcer was admitted to the [facility] on 07/18/2017 for an acute sacral decubitus with osteomyelitis. She was treated there with IV antibiotics and surgical debridement was done on 08/06/2017 and transferred back to a swing bed 08/12/2017. She has completed antibiotics on 09/18/2017 and has been now living at home. Past medical history of GERD, fracture of right humerus, hypertension, rectal cancer, status post colostomy, laparoscopic cholecystectomy, debridement of sacral decubitus ulcer including subcutaneous tissue muscle and bone done on 08/06/2017, previous rectal cancer surgery in April 2017. Reviewing notes from the [facility] it was noted that she was treated for a MRSA sacral osteomyelitis and Proteus bacteremia requiring IV antibiotics which included clindamycin and vancomycin. Vancomycin had to be stopped on September 25 due to neutropenia and thrombocytopenia and changed to daptomycin until 09/20/2017. Infectious disease then advised to switch to clindamycin and the PICC line was  removed on 09/22/2017. review of the electronic medical records noted that cultures of bone and tissue taken from the OR for wound debridement on 95 grew MRSA and she was started on IV antibiotics initially clindamycin and switched to IV vancomycin and the Proteus bacteremia was stained treated with by mouth levofloxacin and Flagyl for 6 weeks to end on 09/03/2017. She was also advised an offloading mattress and would follow-up with supportive care 10/17/2017 -- the patient is ambulating a bit with the help of  OT and PT and she is also eating much better. She has got inappropriate air mattress in place. 11/06/2017 -- the patient is a bit more ambivalent and continues to work with her mobilization. She is not yet ready for a wound VAC. 11/20/17 she is here in follow-up evaluation of a sacral ulcer. Wound VAC has not been improved, will continue with Santyl and anticipate that she can transfer to wound VAC at next appointment, bone fragments present in wound, no indication of infection. 12/19/17 on evaluation today patient sacral wound appears to be doing decently well. She still has some bone exposure although this is minimal compared to previous in fact it just feels rough along the region of the bone or at least where this was this appears to have covered over with granulation for the most part. Nonetheless the wound measurements otherwise have not really significantly changed. We are going to be initiating the Wound VAC today. No fevers, chills, nausea, or vomiting noted at this time. 12/25/17-she is here in follow-up evaluation of the sacral stage IV pressure ulcer. She is tolerating negative pressure wound therapy. She does admit to prolonged sitting which we have discussed at length modified ways to offload and exercise. We will continue with negative pressure wound therapy and follow-up next week Electronic Signature(s) Signed: 12/25/2017 4:11:02 PM By: Lawanda Cousins Entered By: Lawanda Cousins on 12/25/2017 13:54:09 [place], [name] ([id number]) -------------------------------------------------------------------------------- Physician Orders Details Patient Name: [name], [name] Date of Service: 12/25/2017 12:30 PM Medical Record Number: [id number] Patient Account Number: [id number] Date of Birth/Sex: Jan 06, 1930 (84 y.o. Female) Treating RN: Roger Shelter Primary Care Provider: Priscille Kluver Other Clinician: Referring Provider: Priscille Kluver Treating Provider/Extender: Cathie Olden in Treatment: 11 Verbal / Phone Orders: No Diagnosis Coding Wound Cleansing Wound #1 Sacrum o Clean wound with Normal Saline. o Cleanse wound with mild soap and water o May Shower, gently pat wound dry prior to applying new dressing. Skin Barriers/Peri-Wound Care Wound #1 Sacrum o Skin Prep Primary Wound Dressing Wound #1 Sacrum o Mepitel One Contact layer - Over bone in wound bed Dressing Change Frequency Wound #1 Sacrum o Change Dressing Monday, Wednesday, Friday Follow-up Appointments Wound #1 Sacrum o Return Appointment in 1 week. Off-Loading Wound #1 Sacrum o Mattress - Continue air mattress o Turn and reposition every 2 hours Additional Orders / Instructions Wound #1 Sacrum o Increase protein intake. o Activity as tolerated o Other: - Please add vitamin A, vitamin C, multivitamin and zinc supplements to your diet - available over the counter [place] #1 [place] Visits - [place] change NPWT dressing 3 times weekly o [place] Nurse may visit PRN to address patientos wound care needs. o FACE TO FACE ENCOUNTER: MEDICARE and MEDICAID PATIENTS: I certify that this patient is under my care and that I had a face-to-face encounter that meets the physician face-to-face encounter requirements with this patient on this date. The encounter with the patient was in whole or in part for the following  MEDICAL CONDITION: (primary reason for [place]) MEDICAL NECESSITY: I certify, that based on my findings, NURSING services are a medically necessary home health service. HOME BOUND STATUS: I certify that my SCHREITER, [name] ([id number]) clinical findings support that this patient is homebound (i.e., Due to illness or injury, pt requires aid of supportive devices such as crutches, cane, wheelchairs, walkers, the use of special transportation or the assistance of another person to leave their place of residence. There  is a normal inability to leave the home and doing so requires considerable and taxing effort. Other absences are for medical reasons / religious services and are infrequent or of short duration when for other reasons). o If current dressing causes regression in wound condition, may D/C ordered dressing product/s and apply Normal Saline Moist Dressing daily until next [place] / Other MD appointment. [place] of regression in wound condition at [phone]. o Please direct any NON-WOUND related issues/requests for orders to patient's Primary Care Physician Negative Pressure Wound Therapy Wound #1 Sacrum o Wound VAC settings at 125/130 mmHg continuous pressure. Use BLACK/GREEN foam to wound cavity. Use WHITE foam to fill any tunnel/s and/or undermining. Change VAC dressing 3 X WEEK. Change canister as indicated when full. Nurse may titrate settings and frequency of dressing changes as clinically indicated. - HHRN to change NPWT dressing 3 times weekly o Home Health Nurse may d/c VAC for s/s of increased infection, significant wound regression, or uncontrolled drainage. [place] at [phone]. o Apply contact layer over base of wound. o Number of foam/gauze pieces used in the dressing = - 2 Electronic Signature(s) Signed: 12/25/2017 4:11:02 PM By: Lawanda Cousins Entered By: Lawanda Cousins on 12/25/2017  13:54:36 [place], [place] ([id number]) -------------------------------------------------------------------------------- Problem List Details Patient Name: [place], [name] Date of Service: 12/25/2017 12:30 PM Medical Record Number: [id number] Patient Account Number: [id number] Date of Birth/Sex: 10/14/1930 (84 y.o. Female) Treating RN: Roger Shelter Primary Care Provider: Priscille Kluver Other Clinician: Referring Provider: Priscille Kluver Treating Provider/Extender: Cathie Olden in Treatment: 11 Active Problems ICD-10 Encounter Code Description Active Date Diagnosis L89.154 Pressure ulcer of sacral region, stage 4 10/09/2017 Yes E44.0 Moderate protein-calorie malnutrition 10/09/2017 Yes Z99.3 Dependence on wheelchair 10/09/2017 Yes Inactive Problems Resolved Problems Electronic Signature(s) Signed: 12/25/2017 4:11:02 PM By: Lawanda Cousins Entered By: Lawanda Cousins on 12/25/2017 13:52:10 [place], [place] ([id number]) -------------------------------------------------------------------------------- Progress Note Details Patient Name: Zwahlen, [name] Date of Service: 12/25/2017 12:30 PM Medical Record Number: [id number] Patient Account Number: [id number] Date of Birth/Sex: [birth year] (84 y.o. Female) Treating RN: Roger Shelter Primary Care Provider: Priscille Kluver Other Clinician: Referring Provider: Priscille Kluver Treating Provider/Extender: Cathie Olden in Treatment: 11 Subjective Chief Complaint Information obtained from Patient she is here in follow-up evaluation of a sacral ulcer History of Present Illness (HPI) 84 year old patient who has been referred to [place] for the sacral decubitus ulcer was admitted to the [facility] on 07/18/2017 for an acute sacral decubitus with osteomyelitis. She was treated there with IV antibiotics and surgical debridement was done on 08/06/2017 and transferred back to a swing bed 08/12/2017. She has completed antibiotics  on 09/18/2017 and has been now living at home. Past medical history of GERD, fracture of right humerus, hypertension, rectal cancer, status post colostomy, laparoscopic cholecystectomy, debridement of sacral decubitus ulcer including subcutaneous tissue muscle and bone done on 08/06/2017, previous rectal cancer surgery in April 2017. Reviewing notes from the [facility] it was noted that she was treated for a MRSA sacral osteomyelitis and Proteus bacteremia requiring IV antibiotics which included clindamycin and vancomycin. Vancomycin had to be stopped on September 25 due to neutropenia and thrombocytopenia and changed to daptomycin until 09/20/2017. Infectious disease then advised to switch to clindamycin and the PICC line was removed on 09/22/2017. review of the electronic medical records noted that cultures of bone and tissue taken from the OR for wound debridement on 95 grew MRSA and she was started on IV antibiotics  initially clindamycin and switched to IV vancomycin and the Proteus bacteremia was stained treated with by mouth levofloxacin and Flagyl for 6 weeks to end on 09/03/2017. She was also advised an offloading mattress and would follow-up with supportive care 10/17/2017 -- the patient is ambulating a bit with the help of OT and PT and she is also eating much better. She has got inappropriate air mattress in place. 11/06/2017 -- the patient is a bit more ambivalent and continues to work with her mobilization. She is not yet ready for a wound VAC. 11/20/17 she is here in follow-up evaluation of a sacral ulcer. Wound VAC has not been improved, will continue with Santyl and anticipate that she can transfer to wound VAC at next appointment, bone fragments present in wound, no indication of infection. 12/19/17 on evaluation today patient sacral wound appears to be doing decently well. She still has some bone exposure although this is minimal compared to previous in fact it just feels  rough along the region of the bone or at least where this was this appears to have covered over with granulation for the most part. Nonetheless the wound measurements otherwise have not really significantly changed. We are going to be initiating the Wound VAC today. No fevers, chills, nausea, or vomiting noted at this time. 12/25/17-she is here in follow-up evaluation of the sacral stage IV pressure ulcer. She is tolerating negative pressure wound therapy. She does admit to prolonged sitting which we have discussed at length modified ways to offload and exercise. We will continue with negative pressure wound therapy and follow-up next week Patient History Information obtained from Patient. Family History Cancer - Child, No family history of Diabetes, Heart Disease, Hereditary Spherocytosis, Hypertension, Kidney Disease, Lung Disease, Poth, [name] ([id number]) Seizures, Stroke, Thyroid Problems, Tuberculosis. Social History Never smoker, Marital Status - Married, Alcohol Use - Never, Drug Use - No History, Caffeine Use - Daily. Medical And Surgical History Notes Gastrointestinal colostomy since 1990 Oncologic colorectal - 1990 Objective Constitutional Vitals Time Taken: 12:54 PM, Height: 64 in, Weight: 200 lbs, BMI: 34.3, Temperature: 97.7 F, Pulse: 55 bpm, Respiratory Rate: 16 breaths/min, Blood Pressure: 134/44 mmHg. Integumentary (Hair, Skin) Wound #1 status is Open. Original cause of wound was Pressure Injury. The wound is located on the Sacrum. The wound measures 2.2cm length x 1.2cm width x 1.3cm depth; 2.073cm^2 area and 2.695cm^3 volume. There is bone, tendon, Fat Layer (Subcutaneous Tissue) Exposed, and fascia exposed. There is no tunneling noted, however, there is undermining starting at 12:00 and ending at 12:00 with a maximum distance of 2.8cm. There is a large amount of serous drainage noted. The wound margin is flat and intact. There is medium (34-66%) red granulation  within the wound bed. There is a medium (34- 66%) amount of necrotic tissue within the wound bed including Eschar and Adherent Slough. The periwound skin appearance exhibited: Scarring, Maceration. The periwound skin appearance did not exhibit: Callus, Crepitus, Excoriation, Induration, Rash, Dry/Scaly, Atrophie Blanche, Cyanosis, Ecchymosis, Hemosiderin Staining, Mottled, Pallor, Rubor, Erythema. Periwound temperature was noted as No Abnormality. The periwound has tenderness on palpation. Assessment Active Problems ICD-10 L89.154 - Pressure ulcer of sacral region, stage 4 E44.0 - Moderate protein-calorie malnutrition Z99.3 - Dependence on wheelchair Procedures [place], [name] ([id number]) Wound #1 Pre-procedure diagnosis of Wound #1 is a Pressure Ulcer located on the Sacrum . There was a Skin/Subcutaneous Tissue Debridement ([id number]) debridement with total area of 2.64 sq cm performed by Lawanda Cousins, NP. with the following instrument(s):  Curette to remove Viable and Non-Viable tissue/material including Fat Layer (and Subcutaneous Tissue) Exposed, Fibrin/Slough, Skin, and Subcutaneous after achieving pain control using Other (lidocaine 4%). A time out was conducted at 01:25, prior to the start of the procedure. A Minimum amount of bleeding was controlled with Pressure. The procedure was tolerated well with a pain level of 0 throughout and a pain level of 0 following the procedure. Post Debridement Measurements: 2.2cm length x 1.2cm width x 1.3cm depth; 2.695cm^3 volume. Post debridement Stage noted as Category/Stage IV. Character of Wound/Ulcer Post Debridement is stable. Post procedure Diagnosis Wound #1: Same as Pre-Procedure Plan Wound Cleansing: Wound #1 Sacrum: Clean wound with Normal Saline. Cleanse wound with mild soap and water May Shower, gently pat wound dry prior to applying new dressing. Skin Barriers/Peri-Wound Care: Wound #1 Sacrum: Skin Prep Primary Wound  Dressing: Wound #1 Sacrum: Mepitel One Contact layer - Over bone in wound bed Dressing Change Frequency: Wound #1 Sacrum: Change Dressing Monday, Wednesday, Friday Follow-up Appointments: Wound #1 Sacrum: Return Appointment in 1 week. Off-Loading: Wound #1 Sacrum: Mattress - Continue air mattress Turn and reposition every 2 hours Additional Orders / Instructions: Wound #1 Sacrum: Increase protein intake. Activity as tolerated Other: - Please add vitamin A, vitamin C, multivitamin and zinc supplements to your diet - available over the counter Home Health: Wound #1 Sacrum: [place] Visits - [place] change NPWT dressing 3 times weekly Home Health Nurse may visit PRN to address patient s wound care needs. FACE TO FACE ENCOUNTER: MEDICARE and MEDICAID PATIENTS: I certify that this patient is under my care and that I had a face-to-face encounter that meets the physician face-to-face encounter requirements with this patient on this date. The encounter with the patient was in whole or in part for the following MEDICAL CONDITION: (primary reason for [place]) MEDICAL NECESSITY: I certify, that based on my findings, NURSING services are a medically necessary home health service. HOME BOUND STATUS: I certify that my clinical findings support that this patient is homebound (i.e., Due to illness or injury, pt requires aid of supportive devices such as crutches, cane, wheelchairs, walkers, the use of special transportation or the assistance of another person to leave their place of residence. There is a normal inability to leave the home and doing so requires considerable and taxing effort. Other absences are for medical reasons / religious services and are infrequent or of short duration when for other reasons). [name], [name] ([id number]) If current dressing causes regression in wound condition, may D/C ordered dressing product/s and apply Normal Saline Moist  Dressing daily until next [place] / Other MD appointment. [place] of regression in wound condition at [phone]. Please direct any NON-WOUND related issues/requests for orders to patient's Primary Care Physician Negative Pressure Wound Therapy: Wound #1 Sacrum: Wound VAC settings at 125/130 mmHg continuous pressure. Use BLACK/GREEN foam to wound cavity. Use WHITE foam to fill any tunnel/s and/or undermining. Change VAC dressing 3 X WEEK. Change canister as indicated when full. Nurse may titrate settings and frequency of dressing changes as clinically indicated. - HHRN to change NPWT dressing 3 times weekly Home Health Nurse may d/c VAC for s/s of increased infection, significant wound regression, or uncontrolled drainage. [place] at [phone]. Apply contact layer over base of wound. Number of foam/gauze pieces used in the dressing = - 2 1. Continue with negative pressure wound therapy 2. Follow-up next week 3. Aggressive offloading Electronic Signature(s)  Signed: 12/25/2017 4:11:02 PM By: Lawanda Cousins Entered By: Lawanda Cousins on 12/25/2017 14:04:47 [place], [place] ([id number]) -------------------------------------------------------------------------------- ROS/PFSH Details Patient Name: [name], [name] Date of Service: 12/25/2017 12:30 PM Medical Record Number: [id number] Patient Account Number: [id number] Date of Birth/Sex: 06-Aug-1930 (84 y.o. Female) Treating RN: Roger Shelter Primary Care Provider: Priscille Kluver Other Clinician: Referring Provider: Priscille Kluver Treating Provider/Extender: Cathie Olden in Treatment: 11 Information Obtained From Patient Wound History Do you currently have one or more open woundso Yes How many open wounds do you currently haveo 1 Approximately how long have you had your woundso 3 months How have you been treating your wound(s) until nowo santyl Has your wound(s) ever healed  and then re-openedo No Have you had any lab work done in the past montho Yes Who ordered the lab work [facility] Have you tested positive for an antibiotic resistant organism (MRSA, VRE)o No Have you tested positive for osteomyelitis (bone infection)o Yes Have you had any tests for circulation on your legso No Hematologic/Lymphatic Medical History: Positive for: Anemia Negative for: Hemophilia; Human Immunodeficiency Virus; Lymphedema; Sickle Cell Disease Respiratory Medical History: Negative for: Aspiration; Asthma; Chronic Obstructive Pulmonary Disease (COPD); Pneumothorax; Sleep Apnea; Tuberculosis Cardiovascular Medical History: Positive for: Congestive Heart Failure; Hypertension Negative for: Angina; Arrhythmia; Coronary Artery Disease; Deep Vein Thrombosis; Hypotension; Myocardial Infarction; Peripheral Arterial Disease; Peripheral Venous Disease Gastrointestinal Medical History: Negative for: Cirrhosis ; Colitis; Crohnos; Hepatitis A; Hepatitis B Past Medical History Notes: colostomy since 1990 Immunological Medical History: Negative for: Lupus Erythematosus; Raynaudos; Scleroderma Musculoskeletal Medical History: Positive for: Osteoarthritis; Osteomyelitis [name], [name] ([id number]) Negative for: Gout; Rheumatoid Arthritis Neurologic Medical History: Negative for: Dementia; Neuropathy; Quadriplegia Oncologic Medical History: Positive for: Received Chemotherapy; Received Radiation Past Medical History Notes: colorectal - 1990 Immunizations Pneumococcal Vaccine: Received Pneumococcal Vaccination: Yes Immunization Notes: up to date Implantable Devices Family and Social History Cancer: Yes - Child; Diabetes: No; Heart Disease: No; Hereditary Spherocytosis: No; Hypertension: No; Kidney Disease: No; Lung Disease: No; Seizures: No; Stroke: No; Thyroid Problems: No; Tuberculosis: No; Never smoker; Marital Status - Married; Alcohol Use: Never; Drug  Use: No History; Caffeine Use: Daily; Financial Concerns: No; Food, Clothing or Shelter Needs: No; Support System Lacking: No; Transportation Concerns: No; Advanced Directives: Yes (Not Provided); Patient does not want information on Advanced Directives; Living Will: Yes (Not Provided); Medical Power of Attorney: Yes - son - [name] (Not Provided) Physician Affirmation I have reviewed and agree with the above information. Electronic Signature(s) Signed: 12/25/2017 4:11:02 PM By: Lawanda Cousins Signed: 12/25/2017 4:47:24 PM By: Roger Shelter Entered By: Lawanda Cousins on 12/25/2017 13:54:20 [name], [name] ([id number]) -------------------------------------------------------------------------------- SuperBill Details Patient Name: [name], Teresha L. Date of Service: 12/25/2017 Medical Record Number: [id number] Patient Account Number: [id number] Date of Birth/Sex: 1930-08-13 (84 y.o. Female) Treating RN: Roger Shelter Primary Care Provider: Priscille Kluver Other Clinician: Referring Provider: Priscille Kluver Treating Provider/Extender: Cathie Olden in Treatment: 11 Diagnosis Coding ICD-10 Codes Code Description L89.154 Pressure ulcer of sacral region, stage 4 E44.0 Moderate protein-calorie malnutrition Z99.3 Dependence on wheelchair Facility Procedures CPT4 Code: [id number] Description: 11042 - DEB SUBQ TISSUE 20 SQ CM/< ICD-10 Diagnosis Description L89.154 Pressure ulcer of sacral region, stage 4 Modifier: Quantity: 1 Physician Procedures CPT4 Code: 4235361 Description: 44315 - WC PHYS SUBQ TISS 20 SQ CM ICD-10 Diagnosis Description L89.154 Pressure ulcer of sacral region, stage 4 Modifier: Quantity: 1 Electronic Signature(s) Signed: 12/25/2017 4:11:02 PM By: Lawanda Cousins Entered By: Lawanda Cousins on 12/25/2017 14:05:01

## 2017-12-30 NOTE — Progress Notes (Signed)
[name], [name] ([id number]) Visit Report for 12/25/2017 Arrival Information Details Patient Name: [name], [name] Date of Service: 12/25/2017 12:30 PM Medical Record Number: [id number] Patient Account Number: [id number] Date of Birth/Sex: 02-Nov-1930 (84 y.o. Female) Treating RN: Roger Shelter Primary Care Perpetua Elling: Priscille Kluver Other Clinician: Referring Demika Langenderfer: Priscille Kluver Treating Iley Deignan/Extender: Cathie Olden in Treatment: 11 Visit Information History Since Last Visit All ordered tests and consults were completed: No Patient Arrived: Wheel Chair Added or deleted any medications: No Arrival Time: 12:45 Any new allergies or adverse reactions: No Accompanied By: daughter Had a fall or experienced change in No Transfer Assistance: EasyPivot Patient activities of daily living that may affect Lift risk of falls: Patient Identification Verified: Yes Signs or symptoms of abuse/neglect since last visito No Secondary Verification Process Yes Hospitalized since last visit: No Completed: Pain Present Now: No Patient Requires Transmission-Based No Precautions: Patient Has Alerts: No Electronic Signature(s) Signed: 12/25/2017 4:47:24 PM By: Roger Shelter Entered By: Roger Shelter on 12/25/2017 12:50:09 [place], [place]. ([id number]) -------------------------------------------------------------------------------- Encounter Discharge Information Details Patient Name: [name], [name] Date of Service: 12/25/2017 12:30 PM Medical Record Number: [id number] Patient Account Number: [id number] Date of Birth/Sex: 07-06-1930 (84 y.o. Female) Treating RN: Roger Shelter Primary Care Lynae Pederson: Priscille Kluver Other Clinician: Referring Lidiya Reise: Priscille Kluver Treating Matasha Smigelski/Extender: Cathie Olden in Treatment: 11 Encounter Discharge Information Items Discharge Pain Level: 0 Discharge Condition: Stable Ambulatory Status: Wheelchair Discharge Destination:  Home Transportation: Private Auto Accompanied By: daughterr Schedule Follow-up Appointment: Yes Medication Reconciliation completed and No provided to Patient/Care [place]: Provided on Clinical Summary of Care: 12/25/2017 Form Type Recipient Paper Patient RA Electronic Signature(s) Signed: 12/30/2017 10:47:22 AM By: Ruthine Dose Entered By: Ruthine Dose on 12/25/2017 13:42:40 Wurzer, Delecia L. ([id number]) -------------------------------------------------------------------------------- Lower Extremity Assessment Details Patient Name: [name], [name] Date of Service: 12/25/2017 12:30 PM Medical Record Number: [id number] Patient Account Number: [id number] Date of Birth/Sex: [birth year] (83 y.o. Female) Treating RN: Roger Shelter Primary Care Jeff Mccallum: Priscille Kluver Other Clinician: Referring Torrance Stockley: Priscille Kluver Treating Andree Heeg/Extender: Cathie Olden in Treatment: 11 Electronic Signature(s) Signed: 12/25/2017 4:47:24 PM By: Roger Shelter Entered By: Roger Shelter on 12/25/2017 12:54:23 [place], [place] ([id number]) -------------------------------------------------------------------------------- Multi Wound Chart Details Patient Name: [name], [name] Date of Service: 12/25/2017 12:30 PM Medical Record Number: [id number] Patient Account Number: [id number] Date of Birth/Sex: Jun 08, 1930 (84 y.o. Female) Treating RN: Roger Shelter Primary Care Mckade Gurka: Priscille Kluver Other Clinician: Referring Raya Mckinstry: Priscille Kluver Treating Tennis Mckinnon/Extender: Cathie Olden in Treatment: 11 Vital Signs Height(in): 64 Pulse(bpm): 22 Weight(lbs): 200 Blood Pressure(mmHg): 134/44 Body Mass Index(BMI): 34 Temperature(F): 97.7 Respiratory Rate 16 (breaths/min): Photos: [1:No Photos] [N/A:N/A] Wound Location: [1:Sacrum] [N/A:N/A] Wounding Event: [1:Pressure Injury] [N/A:N/A] Primary Etiology: [1:Pressure Ulcer] [N/A:N/A] Comorbid History: [1:Anemia, Congestive  Heart Failure, Hypertension, Osteoarthritis, Osteomyelitis, Received Chemotherapy, Received Radiation] [N/A:N/A] Date Acquired: [1:07/14/2017] [N/A:N/A] Weeks of Treatment: [1:11] [N/A:N/A] Wound Status: [1:Open] [N/A:N/A] Measurements L x W x D [1:2.2x1.2x1.3] [N/A:N/A] (cm) Area (cm) : [1:2.073] [N/A:N/A] Volume (cm) : [1:2.695] [N/A:N/A] % Reduction in Area: [1:-83.30%] [N/A:N/A] % Reduction in Volume: [1:-83.30%] [N/A:N/A] Starting Position 1 [1:12] (o'clock): Ending Position 1 [1:12] (o'clock): Maximum Distance 1 (cm): [1:2.8] Undermining: [1:Yes] [N/A:N/A] Classification: [1:Category/Stage IV] [N/A:N/A] Exudate Amount: [1:Large] [N/A:N/A] Exudate Type: [1:Serous] [N/A:N/A] Exudate Color: [1:amber] [N/A:N/A] Wound Margin: [1:Flat and Intact] [N/A:N/A] Granulation Amount: [1:Medium (34-66%)] [N/A:N/A] Granulation Quality: [1:Red] [N/A:N/A] Necrotic Amount: [1:Medium (34-66%)] [N/A:N/A] Necrotic Tissue: [1:Eschar, Adherent Slough] [N/A:N/A] Exposed Structures: [1:Fascia: Yes Fat Layer (Subcutaneous Tissue) Exposed: Yes Tendon: Yes] [N/A:N/A] Bone:  Yes Muscle: No Joint: No Epithelialization: None N/A N/A Debridement: Debridement ([id number]) N/A N/A Pre-procedure 01:25 N/A N/A Verification/Time Out Taken: Pain Control: Other N/A N/A Tissue Debrided: Fibrin/Slough, Fat, Skin, N/A N/A Subcutaneous Level: Skin/Subcutaneous Tissue N/A N/A Debridement Area (sq cm): 2.64 N/A N/A Instrument: Curette N/A N/A Bleeding: Minimum N/A N/A Hemostasis Achieved: Pressure N/A N/A Procedural Pain: 0 N/A N/A Post Procedural Pain: 0 N/A N/A Debridement Treatment Procedure was tolerated well N/A N/A Response: Post Debridement 2.2x1.2x1.3 N/A N/A Measurements L x W x D (cm) Post Debridement Volume: 2.695 N/A N/A (cm) Post Debridement Stage: Category/Stage IV N/A N/A Periwound Skin Texture: Scarring: Yes N/A N/A Excoriation: No Induration: No Callus: No Crepitus: No Rash:  No Periwound Skin Moisture: Maceration: Yes N/A N/A Dry/Scaly: No Periwound Skin Color: Atrophie Blanche: No N/A N/A Cyanosis: No Ecchymosis: No Erythema: No Hemosiderin Staining: No Mottled: No Pallor: No Rubor: No Temperature: No Abnormality N/A N/A Tenderness on Palpation: Yes N/A N/A Wound Preparation: Ulcer Cleansing: N/A N/A Rinsed/Irrigated with Saline Topical Anesthetic Applied: Other: lidocaine 4% Procedures Performed: Debridement N/A N/A Treatment Notes Wound #1 (Sacrum) 1. Cleansed with: Clean wound with Normal Saline 2. Anesthetic Topical Lidocaine 4% cream to wound bed prior to debridement 4. Dressing Applied: Schar, Kylea L. ([id number]) Mepitel 5. Secondary Dressing Applied Bordered Foam Dressing Dry Gauze Notes HHRN to reapply Wound vac Electronic Signature(s) Signed: 12/25/2017 4:11:02 PM By: Lawanda Cousins Entered By: Lawanda Cousins on 12/25/2017 13:52:16 [place], [place] ([id number]) -------------------------------------------------------------------------------- [place] Details Patient Name: [name], Taylyn L. Date of Service: 12/25/2017 12:30 PM Medical Record Number: [id number] Patient Account Number: [id number] Date of Birth/Sex: July 29, 1930 (84 y.o. Female) Treating RN: Roger Shelter Primary Care Zayden Maffei: Priscille Kluver Other Clinician: Referring Bethania Schlotzhauer: Priscille Kluver Treating Glendene Wyer/Extender: Cathie Olden in Treatment: 11 Active Inactive ` Abuse / Safety / Falls / Self Care Management Nursing Diagnoses: Potential for falls Goals: Patient will not experience any injury related to falls Date Initiated: 10/09/2017 Target Resolution Date: 01/10/2018 Goal Status: Active Interventions: Assess fall risk on admission and as needed Notes: ` Orientation to the Wound Care Program Nursing Diagnoses: Knowledge deficit related to the wound healing center program Goals: Patient/caregiver will verbalize  understanding of the [facility] Date Initiated: 10/09/2017 Target Resolution Date: 01/10/2018 Goal Status: Active Interventions: Provide education on orientation to the wound center Notes: ` Pressure Nursing Diagnoses: Potential for impaired tissue integrity related to pressure, friction, moisture, and shear Goals: Patient will remain free from development of additional pressure ulcers Date Initiated: 10/09/2017 Target Resolution Date: 01/10/2018 Goal Status: Active Interventions: Gracy, Ehly Emiyah L. ([id number]) Assess offloading mechanisms upon admission and as needed Provide education on pressure ulcers Notes: ` Wound/Skin Impairment Nursing Diagnoses: Impaired tissue integrity Goals: Ulcer/skin breakdown will heal within 14 weeks Date Initiated: 10/09/2017 Target Resolution Date: 01/10/2018 Goal Status: Active Interventions: Assess patient/caregiver ability to obtain necessary supplies Assess patient/caregiver ability to perform ulcer/skin care regimen upon admission and as needed Assess ulceration(s) every visit Notes: Electronic Signature(s) Signed: 12/25/2017 12:59:50 PM By: Roger Shelter Entered By: Roger Shelter on 12/25/2017 12:59:50 [place], [place] L. ([id number]) -------------------------------------------------------------------------------- Pain Assessment Details Patient Name: [name], Chonte L. Date of Service: 12/25/2017 12:30 PM Medical Record Number: [id number] Patient Account Number: [id number] Date of Birth/Sex: January 05, 1930 (84 y.o. Female) Treating RN: Roger Shelter Primary Care Kentavious Michele: Priscille Kluver Other Clinician: Referring Mary-Ann Pennella: Priscille Kluver Treating Whitni Pasquini/Extender: Cathie Olden in Treatment: 11 Active Problems Location of Pain Severity and Description of Pain Patient Has Paino No  Site Locations Pain Management and Medication Current Pain Management: Electronic Signature(s) Signed: 12/25/2017 4:47:24 PM By:  Roger Shelter Entered By: Roger Shelter on 12/25/2017 12:50:16 [place], [place] ([id number]) -------------------------------------------------------------------------------- Patient/Caregiver Education Details Patient Name: [name], Marlea L. Date of Service: 12/25/2017 12:30 PM Medical Record Number: [id number] Patient Account Number: [id number] Date of Birth/Gender: February 28, 1930 (84 y.o. Female) Treating RN: Roger Shelter Primary Care Physician: Priscille Kluver Other Clinician: Referring Physician: Priscille Kluver Treating Physician/Extender: Cathie Olden in Treatment: 11 Education Assessment Education Provided To: Patient and Caregiver daughter Education Topics Provided Wound Debridement: Handouts: Wound Debridement Methods: Explain/Verbal Responses: State content correctly Wound/Skin Impairment: Handouts: Caring for Your Ulcer Methods: Explain/Verbal Responses: State content correctly Electronic Signature(s) Signed: 12/25/2017 4:47:24 PM By: Roger Shelter Entered By: Roger Shelter on 12/25/2017 13:39:17 [place], [place] ([id number]) -------------------------------------------------------------------------------- Wound Assessment Details Patient Name: Toda, [place] L. Date of Service: 12/25/2017 12:30 PM Medical Record Number: [id number] Patient Account Number: [id number] Date of Birth/Sex: [birth year] (84 y.o. Female) Treating RN: Roger Shelter Primary Care Lorali Khamis: Priscille Kluver Other Clinician: Referring Lidie Glade: Priscille Kluver Treating Veronia Laprise/Extender: Cathie Olden in Treatment: 11 Wound Status Wound Number: 1 Primary Pressure Ulcer Etiology: Wound Location: Sacrum Wound Open Wounding Event: Pressure Injury Status: Date Acquired: 07/14/2017 Comorbid Anemia, Congestive Heart Failure, Weeks Of Treatment: 11 History: Hypertension, Osteoarthritis, Osteomyelitis, Clustered Wound: No Received Chemotherapy, Received Radiation Photos Photo  Uploaded By: Roger Shelter on 12/25/2017 16:42:37 Wound Measurements Length: (cm) 2.2 Width: (cm) 1.2 Depth: (cm) 1.3 Area: (cm) 2.073 Volume: (cm) 2.695 % Reduction in Area: -83.3% % Reduction in Volume: -83.3% Epithelialization: None Tunneling: No Undermining: Yes Starting Position (o'clock): 12 Ending Position (o'clock): 12 Maximum Distance: (cm) 2.8 Wound Description Classification: Category/Stage IV Wound Margin: Flat and Intact Exudate Amount: Large Exudate Type: Serous Exudate Color: amber Foul Odor After Cleansing: No Slough/Fibrino Yes Wound Bed Granulation Amount: Medium (34-66%) Exposed Structure Granulation Quality: Red Fascia Exposed: Yes Necrotic Amount: Medium (34-66%) Fat Layer (Subcutaneous Tissue) Exposed: Yes Necrotic Quality: Eschar, Adherent Slough Tendon Exposed: Yes Muscle Exposed: No Bergman, Jaqueline L. ([id number]) Joint Exposed: No Bone Exposed: Yes Periwound Skin Texture Texture Color No Abnormalities Noted: No No Abnormalities Noted: No Callus: No Atrophie Blanche: No Crepitus: No Cyanosis: No Excoriation: No Ecchymosis: No Induration: No Erythema: No Rash: No Hemosiderin Staining: No Scarring: Yes Mottled: No Pallor: No Moisture Rubor: No No Abnormalities Noted: No Dry / Scaly: No Temperature / Pain Maceration: Yes Temperature: No Abnormality Tenderness on Palpation: Yes Wound Preparation Ulcer Cleansing: Rinsed/Irrigated with Saline Topical Anesthetic Applied: Other: lidocaine 4%, Treatment Notes Wound #1 (Sacrum) 1. Cleansed with: Clean wound with Normal Saline 2. Anesthetic Topical Lidocaine 4% cream to wound bed prior to debridement 4. Dressing Applied: Mepitel 5. Secondary Dressing Applied Bordered Foam Dressing Dry Gauze Notes HHRN to reapply Wound vac Electronic Signature(s) Signed: 12/25/2017 4:47:24 PM By: Roger Shelter Entered By: Roger Shelter on 12/25/2017 12:49:23 [place], [place].  ([id number]) -------------------------------------------------------------------------------- Vitals Details Patient Name: [name], Gabrella L. Date of Service: 12/25/2017 12:30 PM Medical Record Number: [id number] Patient Account Number: [id number] Date of Birth/Sex: 1930-09-15 (84 y.o. Female) Treating RN: Roger Shelter Primary Care Dj Senteno: Priscille Kluver Other Clinician: Referring Blair Lundeen: Priscille Kluver Treating Aalayah Riles/Extender: Cathie Olden in Treatment: 11 Vital Signs Time Taken: 12:54 Temperature (F): 97.7 Height (in): 64 Pulse (bpm): 55 Weight (lbs): 200 Respiratory Rate (breaths/min): 16 Body Mass Index (BMI): 34.3 Blood Pressure (mmHg): 134/44 Reference Range: 80 - 120 mg / dl Electronic Signature(s) Signed: 12/25/2017 4:47:24 PM By: Claudina Lick,  Cheryl Entered By: Roger Shelter on 12/25/2017 12:54:10

## 2018-01-01 ENCOUNTER — Ambulatory Visit: Payer: Medicare Other | Admitting: Nurse Practitioner

## 2018-01-08 ENCOUNTER — Encounter: Payer: Medicare Other | Attending: Nurse Practitioner | Admitting: Nurse Practitioner

## 2018-01-08 DIAGNOSIS — Z79899 Other long term (current) drug therapy: Secondary | ICD-10-CM | POA: Diagnosis not present

## 2018-01-08 DIAGNOSIS — E440 Moderate protein-calorie malnutrition: Secondary | ICD-10-CM | POA: Insufficient documentation

## 2018-01-08 DIAGNOSIS — Z85048 Personal history of other malignant neoplasm of rectum, rectosigmoid junction, and anus: Secondary | ICD-10-CM | POA: Diagnosis not present

## 2018-01-08 DIAGNOSIS — I252 Old myocardial infarction: Secondary | ICD-10-CM | POA: Diagnosis not present

## 2018-01-08 DIAGNOSIS — K219 Gastro-esophageal reflux disease without esophagitis: Secondary | ICD-10-CM | POA: Diagnosis not present

## 2018-01-08 DIAGNOSIS — L89154 Pressure ulcer of sacral region, stage 4: Secondary | ICD-10-CM | POA: Insufficient documentation

## 2018-01-08 DIAGNOSIS — Z993 Dependence on wheelchair: Secondary | ICD-10-CM | POA: Insufficient documentation

## 2018-01-08 DIAGNOSIS — M1990 Unspecified osteoarthritis, unspecified site: Secondary | ICD-10-CM | POA: Diagnosis not present

## 2018-01-08 DIAGNOSIS — I509 Heart failure, unspecified: Secondary | ICD-10-CM | POA: Insufficient documentation

## 2018-01-08 DIAGNOSIS — I110 Hypertensive heart disease with heart failure: Secondary | ICD-10-CM | POA: Insufficient documentation

## 2018-01-08 DIAGNOSIS — I739 Peripheral vascular disease, unspecified: Secondary | ICD-10-CM | POA: Diagnosis not present

## 2018-01-08 DIAGNOSIS — Z7982 Long term (current) use of aspirin: Secondary | ICD-10-CM | POA: Insufficient documentation

## 2018-01-08 DIAGNOSIS — Z9049 Acquired absence of other specified parts of digestive tract: Secondary | ICD-10-CM | POA: Insufficient documentation

## 2018-01-09 NOTE — Progress Notes (Signed)
REGANA, KEMPLE ([id number]) Visit Report for 01/08/2018 Chief Complaint Document Details Patient Name: [name], [name] Date of Service: 01/08/2018 12:30 PM Medical Record Number: [id number] Patient Account Number: [id number] Date of Birth/Sex: 1930/01/28 (84 y.o. Female) Treating RN: Ahmed Prima Primary Care Provider: Priscille Kluver Other Clinician: Referring Provider: Priscille Kluver Treating Provider/Extender: Cathie Olden in Treatment: 13 Information Obtained from: Patient Chief Complaint she is here in follow-up evaluation of a sacral ulcer Electronic Signature(s) Signed: 01/08/2018 1:25:27 PM By: Lawanda Cousins Entered By: Lawanda Cousins on 01/08/2018 13:25:27 [place], [name] ([id number]) -------------------------------------------------------------------------------- Debridement Details Patient Name: [name], [name] Date of Service: 01/08/2018 12:30 PM Medical Record Number: [id number] Patient Account Number: [id number] Date of Birth/Sex: 10-14-1930 (84 y.o. Female) Treating RN: Carolyne Fiscal, Debi Primary Care Provider: Priscille Kluver Other Clinician: Referring Provider: Priscille Kluver Treating Provider/Extender: Cathie Olden in Treatment: 13 Debridement Performed for Wound #1 Sacrum Assessment: Performed By: Physician Lawanda Cousins, NP Debridement: Debridement Pre-procedure Verification/Time Yes - 13:02 Out Taken: Start Time: 13:02 Pain Control: Other : lidocaine 4% Level: Skin/Subcutaneous Tissue Total Area Debrided (L x W): 2 (cm) x 0.8 (cm) = 1.6 (cm) Tissue and other material Viable, Non-Viable, Fibrin/Slough, Skin, Subcutaneous debrided: Instrument: Curette Bleeding: Moderate Hemostasis Achieved: Pressure End Time: 13:04 Procedural Pain: 0 Post Procedural Pain: 0 Response to Treatment: Procedure was tolerated well Post Debridement Measurements of Total Wound Length: (cm) 2 Stage: Category/Stage IV Width: (cm) 0.8 Depth: (cm) 1 Volume: (cm)  1.257 Character of Wound/Ulcer Post Stable Debridement: Post Procedure Diagnosis Same as Pre-procedure Electronic Signature(s) Signed: 01/08/2018 1:25:19 PM By: Lawanda Cousins Signed: 01/08/2018 3:16:44 PM By: Alric Quan Entered By: Lawanda Cousins on 01/08/2018 13:25:19 [place], [name] ([id number]) -------------------------------------------------------------------------------- HPI Details Patient Name: [name], [name] Date of Service: 01/08/2018 12:30 PM Medical Record Number: [id number] Patient Account Number: [id number] Date of Birth/Sex: Mar 23, 1930 (84 y.o. Female) Treating RN: Carolyne Fiscal, Debi Primary Care Provider: Priscille Kluver Other Clinician: Referring Provider: Priscille Kluver Treating Provider/Extender: Cathie Olden in Treatment: 38 History of Present Illness HPI Description: 84 year old patient who has been referred to [place] for the sacral decubitus ulcer was admitted to the [facility] on 07/18/2017 for an acute sacral decubitus with osteomyelitis. She was treated there with IV antibiotics and surgical debridement was done on 08/06/2017 and transferred back to a swing bed 08/12/2017. She has completed antibiotics on 09/18/2017 and has been now living at home. Past medical history of GERD, fracture of right humerus, hypertension, rectal cancer, status post colostomy, laparoscopic cholecystectomy, debridement of sacral decubitus ulcer including subcutaneous tissue muscle and bone done on 08/06/2017, previous rectal cancer surgery in April 2017. Reviewing notes from the [facility] it was noted that she was treated for a MRSA sacral osteomyelitis and Proteus bacteremia requiring IV antibiotics which included clindamycin and vancomycin. Vancomycin had to be stopped on September 25 due to neutropenia and thrombocytopenia and changed to daptomycin until 09/20/2017. Infectious disease then advised to switch to clindamycin and the PICC line was removed on  09/22/2017. review of the electronic medical records noted that cultures of bone and tissue taken from the OR for wound debridement on 95 grew MRSA and she was started on IV antibiotics initially clindamycin and switched to IV vancomycin and the Proteus bacteremia was stained treated with by mouth levofloxacin and Flagyl for 6 weeks to end on 09/03/2017. She was also advised an offloading mattress and would follow-up with supportive care 10/17/2017 -- the patient is ambulating a bit with the help of OT  and PT and she is also eating much better. She has got inappropriate air mattress in place. 11/06/2017 -- the patient is a bit more ambivalent and continues to work with her mobilization. She is not yet ready for a wound VAC. 11/20/17 she is here in follow-up evaluation of a sacral ulcer. Wound VAC has not been improved, will continue with Santyl and anticipate that she can transfer to wound VAC at next appointment, bone fragments present in wound, no indication of infection. 12/19/17 on evaluation today patient sacral wound appears to be doing decently well. She still has some bone exposure although this is minimal compared to previous in fact it just feels rough along the region of the bone or at least where this was this appears to have covered over with granulation for the most part. Nonetheless the wound measurements otherwise have not really significantly changed. We are going to be initiating the Wound VAC today. No fevers, chills, nausea, or vomiting noted at this time. 12/25/17-she is here in follow-up evaluation of the sacral stage IV pressure ulcer. She is tolerating negative pressure wound therapy. She does admit to prolonged sitting which we have discussed at length modified ways to offload and exercise. We will continue with negative pressure wound therapy and follow-up next week 01/08/18-she is here in follow-up evaluation for sacral stage IV pressure ulcer. She did not make last week's  appointment secondary to being hospitalized for electrolyte imbalance: she states the negative pressure wound therapy was maintained throughout hospitalization. She is unable to adequately offload per her admission which is primarily contributing to poor healing. She does have pressure relief chair cushions that she admits to using. We'll add Prisma, in conjunction with negative pressure. She is requesting a 2 week follow-up. Electronic Signature(s) Signed: 01/08/2018 1:27:41 PM By: Lawanda Cousins Entered By: Lawanda Cousins on 01/08/2018 13:27:41 [place], [place]. ([id number]) -------------------------------------------------------------------------------- Physician Orders Details Patient Name: [name], [name] Date of Service: 01/08/2018 12:30 PM Medical Record Number: [id number] Patient Account Number: [id number] Date of Birth/Sex: May 07, 1930 (84 y.o. Female) Treating RN: Roger Shelter Primary Care Provider: Priscille Kluver Other Clinician: Referring Provider: Priscille Kluver Treating Provider/Extender: Cathie Olden in Treatment: 80 Verbal / Phone Orders: No Diagnosis Coding ICD-10 Coding Code Description L89.154 Pressure ulcer of sacral region, stage 4 E44.0 Moderate protein-calorie malnutrition Z99.3 Dependence on wheelchair Wound Cleansing Wound #1 Sacrum o Clean wound with Normal Saline. o Cleanse wound with mild soap and water o May Shower, gently pat wound dry prior to applying new dressing. Skin Barriers/Peri-Wound Care Wound #1 Sacrum o Skin Prep Primary Wound Dressing Wound #1 Sacrum o Prisma Ag - moisten with saline Dressing Change Frequency Wound #1 Sacrum o Change Dressing Monday, Wednesday, Friday Follow-up Appointments Wound #1 Sacrum o Return Appointment in 1 week. Off-Loading Wound #1 Sacrum o Mattress - Continue air mattress o Turn and reposition every 2 hours Additional Orders / Instructions Wound #1 Sacrum o Increase protein  intake. o Activity as tolerated o Other: - Please add vitamin A, vitamin C, multivitamin and zinc supplements to your diet - available over the counter [place] #1 Yana Schorr, [place] ([id number]) o [place] Visits - [place] change NPWT dressing 3 times weekly o [place] Nurse may visit PRN to address patientos wound care needs. o FACE TO FACE ENCOUNTER: MEDICARE and MEDICAID PATIENTS: I certify that this patient is under my care and that I had a face-to-face encounter that meets the physician face-to-face encounter requirements with  this patient on this date. The encounter with the patient was in whole or in part for the following MEDICAL CONDITION: (primary reason for [place]) MEDICAL NECESSITY: I certify, that based on my findings, NURSING services are a medically necessary home health service. HOME BOUND STATUS: I certify that my clinical findings support that this patient is homebound (i.e., Due to illness or injury, pt requires aid of supportive devices such as crutches, cane, wheelchairs, walkers, the use of special transportation or the assistance of another person to leave their place of residence. There is a normal inability to leave the home and doing so requires considerable and taxing effort. Other absences are for medical reasons / religious services and are infrequent or of short duration when for other reasons). o If current dressing causes regression in wound condition, may D/C ordered dressing product/s and apply Normal Saline Moist Dressing daily until next [place] / Other MD appointment. [place] of regression in wound condition at [phone]. o Please direct any NON-WOUND related issues/requests for orders to patient's Primary Care Physician Negative Pressure Wound Therapy Wound #1 Sacrum o Wound VAC settings at 125/130 mmHg continuous pressure. Use BLACK/GREEN foam to wound  cavity. Use WHITE foam to fill any tunnel/s and/or undermining. Change VAC dressing 3 X WEEK. Change canister as indicated when full. Nurse may titrate settings and frequency of dressing changes as clinically indicated. - HHRN to change NPWT dressing 3 times weekly o Home Health Nurse may d/c VAC for s/s of increased infection, significant wound regression, or uncontrolled drainage. [place] at [phone]. o Apply contact layer over base of wound. o Number of foam/gauze pieces used in the dressing = - 2 Electronic Signature(s) Signed: 01/08/2018 3:49:02 PM By: Lawanda Cousins Entered By: Lawanda Cousins on 01/08/2018 13:49:59 [place], [place] ([id number]) -------------------------------------------------------------------------------- Problem List Details Patient Name: [name], [name] Date of Service: 01/08/2018 12:30 PM Medical Record Number: [id number] Patient Account Number: [id number] Date of Birth/Sex: 12/09/1929 (84 y.o. Female) Treating RN: Ahmed Prima Primary Care Provider: Priscille Kluver Other Clinician: Referring Provider: Priscille Kluver Treating Provider/Extender: Cathie Olden in Treatment: 13 Active Problems ICD-10 Encounter Code Description Active Date Diagnosis L89.154 Pressure ulcer of sacral region, stage 4 10/09/2017 Yes E44.0 Moderate protein-calorie malnutrition 10/09/2017 Yes Z99.3 Dependence on wheelchair 10/09/2017 Yes Inactive Problems Resolved Problems Electronic Signature(s) Signed: 01/08/2018 1:24:57 PM By: Lawanda Cousins Entered By: Lawanda Cousins on 01/08/2018 13:24:57 [place], [place] ([id number]) -------------------------------------------------------------------------------- Progress Note Details Patient Name: [name], [name] Date of Service: 01/08/2018 12:30 PM Medical Record Number: [id number] Patient Account Number: [id number] Date of Birth/Sex: [birth year] (84 y.o. Female) Treating RN: Carolyne Fiscal, Debi Primary Care  Provider: Priscille Kluver Other Clinician: Referring Provider: Priscille Kluver Treating Provider/Extender: Cathie Olden in Treatment: 13 Subjective Chief Complaint Information obtained from Patient she is here in follow-up evaluation of a sacral ulcer History of Present Illness (HPI) 84 year old patient who has been referred to [place] for the sacral decubitus ulcer was admitted to the [facility] on 07/18/2017 for an acute sacral decubitus with osteomyelitis. She was treated there with IV antibiotics and surgical debridement was done on 08/06/2017 and transferred back to a swing bed 08/12/2017. She has completed antibiotics on 09/18/2017 and has been now living at home. Past medical history of GERD, fracture of right humerus, hypertension, rectal cancer, status post colostomy, laparoscopic cholecystectomy, debridement of sacral decubitus ulcer including subcutaneous tissue muscle and bone done on 08/06/2017, previous rectal cancer surgery in April 2017.  Reviewing notes from the [facility] it was noted that she was treated for a MRSA sacral osteomyelitis and Proteus bacteremia requiring IV antibiotics which included clindamycin and vancomycin. Vancomycin had to be stopped on September 25 due to neutropenia and thrombocytopenia and changed to daptomycin until 09/20/2017. Infectious disease then advised to switch to clindamycin and the PICC line was removed on 09/22/2017. review of the electronic medical records noted that cultures of bone and tissue taken from the OR for wound debridement on 95 grew MRSA and she was started on IV antibiotics initially clindamycin and switched to IV vancomycin and the Proteus bacteremia was stained treated with by mouth levofloxacin and Flagyl for 6 weeks to end on 09/03/2017. She was also advised an offloading mattress and would follow-up with supportive care 10/17/2017 -- the patient is ambulating a bit with the help of OT and PT and she is also eating  much better. She has got inappropriate air mattress in place. 11/06/2017 -- the patient is a bit more ambivalent and continues to work with her mobilization. She is not yet ready for a wound VAC. 11/20/17 she is here in follow-up evaluation of a sacral ulcer. Wound VAC has not been improved, will continue with Santyl and anticipate that she can transfer to wound VAC at next appointment, bone fragments present in wound, no indication of infection. 12/19/17 on evaluation today patient sacral wound appears to be doing decently well. She still has some bone exposure although this is minimal compared to previous in fact it just feels rough along the region of the bone or at least where this was this appears to have covered over with granulation for the most part. Nonetheless the wound measurements otherwise have not really significantly changed. We are going to be initiating the Wound VAC today. No fevers, chills, nausea, or vomiting noted at this time. 12/25/17-she is here in follow-up evaluation of the sacral stage IV pressure ulcer. She is tolerating negative pressure wound therapy. She does admit to prolonged sitting which we have discussed at length modified ways to offload and exercise. We will continue with negative pressure wound therapy and follow-up next week 01/08/18-she is here in follow-up evaluation for sacral stage IV pressure ulcer. She did not make last week's appointment secondary to being hospitalized for electrolyte imbalance: she states the negative pressure wound therapy was maintained throughout hospitalization. She is unable to adequately offload per her admission which is primarily contributing to poor healing. She does have pressure relief chair cushions that she admits to using. We'll add Prisma, in conjunction with negative pressure. She is requesting a 2 week follow-up. Patient History [name], [name] ([id number]) Information obtained from Patient. Family History Cancer  - Child, No family history of Diabetes, Heart Disease, Hereditary Spherocytosis, Hypertension, Kidney Disease, Lung Disease, Seizures, Stroke, Thyroid Problems, Tuberculosis. Social History Never smoker, Marital Status - Married, Alcohol Use - Never, Drug Use - No History, Caffeine Use - Daily. Medical And Surgical History Notes Gastrointestinal colostomy since 1990 Oncologic colorectal - 1990 Objective Constitutional Vitals Time Taken: 12:44 PM, Height: 64 in, Weight: 200 lbs, BMI: 34.3, Temperature: 97.6 F, Pulse: 51 bpm, Respiratory Rate: 16 breaths/min, Blood Pressure: 138/51 mmHg. Integumentary (Hair, Skin) Wound #1 status is Open. Original cause of wound was Pressure Injury. The wound is located on the Sacrum. The wound measures 2cm length x 0.8cm width x 0.9cm depth; 1.257cm^2 area and 1.131cm^3 volume. There is bone, tendon, Fat Layer (Subcutaneous Tissue) Exposed, and fascia exposed. There is no  tunneling noted, however, there is undermining starting at 4:00 and ending at 9:00 with a maximum distance of 2.9cm. There is a large amount of serous drainage noted. The wound margin is flat and intact. There is medium (34-66%) red granulation within the wound bed. There is a medium (34- 66%) amount of necrotic tissue within the wound bed including Eschar and Adherent Slough. The periwound skin appearance exhibited: Scarring, Maceration. The periwound skin appearance did not exhibit: Callus, Crepitus, Excoriation, Induration, Rash, Dry/Scaly, Atrophie Blanche, Cyanosis, Ecchymosis, Hemosiderin Staining, Mottled, Pallor, Rubor, Erythema. Periwound temperature was noted as No Abnormality. The periwound has tenderness on palpation. Assessment Active Problems ICD-10 L89.154 - Pressure ulcer of sacral region, stage 4 E44.0 - Moderate protein-calorie malnutrition Z99.3 - Dependence on wheelchair [place], [place] ([id number]) Procedures Wound #1 Pre-procedure diagnosis of Wound #1 is a  Pressure Ulcer located on the Sacrum . There was a Skin/Subcutaneous Tissue Debridement (28413-24401) debridement with total area of 1.6 sq cm performed by Lawanda Cousins, NP. with the following instrument(s): Curette to remove Viable and Non-Viable tissue/material including Fibrin/Slough, Skin, and Subcutaneous after achieving pain control using Other (lidocaine 4%). A time out was conducted at 13:02, prior to the start of the procedure. A Moderate amount of bleeding was controlled with Pressure. The procedure was tolerated well with a pain level of 0 throughout and a pain level of 0 following the procedure. Post Debridement Measurements: 2cm length x 0.8cm width x 1cm depth; 1.257cm^3 volume. Post debridement Stage noted as Category/Stage IV. Character of Wound/Ulcer Post Debridement is stable. Post procedure Diagnosis Wound #1: Same as Pre-Procedure Plan Wound Cleansing: Wound #1 Sacrum: Clean wound with Normal Saline. Cleanse wound with mild soap and water May Shower, gently pat wound dry prior to applying new dressing. Skin Barriers/Peri-Wound Care: Wound #1 Sacrum: Skin Prep Primary Wound Dressing: Wound #1 Sacrum: Prisma Ag - moisten with saline Dressing Change Frequency: Wound #1 Sacrum: Change Dressing Monday, Wednesday, Friday Follow-up Appointments: Wound #1 Sacrum: Return Appointment in 1 week. Off-Loading: Wound #1 Sacrum: Mattress - Continue air mattress Turn and reposition every 2 hours Additional Orders / Instructions: Wound #1 Sacrum: Increase protein intake. Activity as tolerated Other: - Please add vitamin A, vitamin C, multivitamin and zinc supplements to your diet - available over the counter Home Health: Wound #1 Sacrum: [place] Visits - [place] change NPWT dressing 3 times weekly Home Health Nurse may visit PRN to address patient s wound care needs. FACE TO FACE ENCOUNTER: MEDICARE and MEDICAID PATIENTS: I certify that this patient is  under my care and that I had a face-to-face encounter that meets the physician face-to-face encounter requirements with this patient on this date. The encounter with the patient was in whole or in part for the following MEDICAL CONDITION: (primary reason for [place]) MEDICAL NECESSITY: I certify, that based on my findings, NURSING services are a medically necessary home health service. HOME BOUND STATUS: I certify that my clinical findings support that this patient is homebound (i.e., Due to [name], [name] ([id number]) illness or injury, pt requires aid of supportive devices such as crutches, cane, wheelchairs, walkers, the use of special transportation or the assistance of another person to leave their place of residence. There is a normal inability to leave the home and doing so requires considerable and taxing effort. Other absences are for medical reasons / religious services and are infrequent or of short duration when for other reasons). If current dressing causes regression in wound condition,  may D/C ordered dressing product/s and apply Normal Saline Moist Dressing daily until next [place] / Other MD appointment. [place] of regression in wound condition at [phone]. Please direct any NON-WOUND related issues/requests for orders to patient's Primary Care Physician Negative Pressure Wound Therapy: Wound #1 Sacrum: Wound VAC settings at 125/130 mmHg continuous pressure. Use BLACK/GREEN foam to wound cavity. Use WHITE foam to fill any tunnel/s and/or undermining. Change VAC dressing 3 X WEEK. Change canister as indicated when full. Nurse may titrate settings and frequency of dressing changes as clinically indicated. - HHRN to change NPWT dressing 3 times weekly Home Health Nurse may d/c VAC for s/s of increased infection, significant wound regression, or uncontrolled drainage. [place] at [phone]. Apply contact layer  over base of wound. Number of foam/gauze pieces used in the dressing = - 2 1. prisma to depth and undermining, continue with NPWT 2. continue to offload 3. follow up in two weeks Electronic Signature(s) Signed: 01/08/2018 1:50:32 PM By: Lawanda Cousins Entered By: Lawanda Cousins on 01/08/2018 [name], [place] L. ([id number]) -------------------------------------------------------------------------------- ROS/PFSH Details Patient Name: [name], [name] Date of Service: 01/08/2018 12:30 PM Medical Record Number: [id number] Patient Account Number: [id number] Date of Birth/Sex: [birth year] (84 y.o. Female) Treating RN: Carolyne Fiscal, Debi Primary Care Provider: Priscille Kluver Other Clinician: Referring Provider: Priscille Kluver Treating Provider/Extender: Cathie Olden in Treatment: 13 Information Obtained From Patient Wound History Do you currently have one or more open woundso Yes How many open wounds do you currently haveo 1 Approximately how long have you had your woundso 3 months How have you been treating your wound(s) until nowo santyl Has your wound(s) ever healed and then re-openedo No Have you had any lab work done in the past montho Yes Who ordered the lab work [facility] hospital Have you tested positive for an antibiotic resistant organism (MRSA, VRE)o No Have you tested positive for osteomyelitis (bone infection)o Yes Have you had any tests for circulation on your legso No Hematologic/Lymphatic Medical History: Positive for: Anemia Negative for: Hemophilia; Human Immunodeficiency Virus; Lymphedema; Sickle Cell Disease Respiratory Medical History: Negative for: Aspiration; Asthma; Chronic Obstructive Pulmonary Disease (COPD); Pneumothorax; Sleep Apnea; Tuberculosis Cardiovascular Medical History: Positive for: Congestive Heart Failure; Hypertension Negative for: Angina; Arrhythmia; Coronary Artery Disease; Deep Vein Thrombosis; Hypotension; Myocardial  Infarction; Peripheral Arterial Disease; Peripheral Venous Disease Gastrointestinal Medical History: Negative for: Cirrhosis ; Colitis; Crohnos; Hepatitis A; Hepatitis B Past Medical History Notes: colostomy since 1990 Immunological Medical History: Negative for: Lupus Erythematosus; Raynaudos; Scleroderma Musculoskeletal Medical History: Positive for: Osteoarthritis; Osteomyelitis [name], [name] ([id number]) Negative for: Gout; Rheumatoid Arthritis Neurologic Medical History: Negative for: Dementia; Neuropathy; Quadriplegia Oncologic Medical History: Positive for: Received Chemotherapy; Received Radiation Past Medical History Notes: colorectal - 1990 Immunizations Pneumococcal Vaccine: Received Pneumococcal Vaccination: Yes Immunization Notes: up to date Implantable Devices Family and Social History Cancer: Yes - Child; Diabetes: No; Heart Disease: No; Hereditary Spherocytosis: No; Hypertension: No; Kidney Disease: No; Lung Disease: No; Seizures: No; Stroke: No; Thyroid Problems: No; Tuberculosis: No; Never smoker; Marital Status - Married; Alcohol Use: Never; Drug Use: No History; Caffeine Use: Daily; Financial Concerns: No; Food, Clothing or Shelter Needs: No; Support System Lacking: No; Transportation Concerns: No; Advanced Directives: Yes (Not Provided); Patient does not want information on Advanced Directives; Living Will: Yes (Not Provided); Medical Power of Attorney: Yes - son - Keeara Frees (Not Provided) Physician Affirmation I have reviewed and agree with the above information. Electronic Signature(s)  Signed: 01/08/2018 3:16:44 PM By: Alric Quan Signed: 01/08/2018 3:49:02 PM By: Lawanda Cousins Entered By: Lawanda Cousins on 01/08/2018 13:27:49 [name], [name] ([id number]) -------------------------------------------------------------------------------- SuperBill Details Patient Name: [name], Kaylene L. Date of Service: 01/08/2018 Medical Record Number:  [id number] Patient Account Number: [id number] Date of Birth/Sex: January 14, 1930 (84 y.o. Female) Treating RN: Carolyne Fiscal, Debi Primary Care Provider: Priscille Kluver Other Clinician: Referring Provider: Priscille Kluver Treating Provider/Extender: Cathie Olden in Treatment: 13 Diagnosis Coding ICD-10 Codes Code Description L89.154 Pressure ulcer of sacral region, stage 4 E44.0 Moderate protein-calorie malnutrition Z99.3 Dependence on wheelchair Facility Procedures CPT4 Code: 28366294 Description: [facility] TISSUE 20 SQ CM/< ICD-10 Diagnosis Description L89.154 Pressure ulcer of sacral region, stage 4 E44.0 Moderate protein-calorie malnutrition Modifier: Quantity: 1 Physician Procedures CPT4 Code: 7654650 Description: 35465 - WC PHYS SUBQ TISS 20 SQ CM ICD-10 Diagnosis Description L89.154 Pressure ulcer of sacral region, stage 4 E44.0 Moderate protein-calorie malnutrition Modifier: Quantity: 1 Electronic Signature(s) Signed: 01/08/2018 1:50:55 PM By: Lawanda Cousins Entered By: Lawanda Cousins on 01/08/2018 13:50:55

## 2018-01-09 NOTE — Progress Notes (Signed)
[name], [name] ([id number]) Visit Report for 01/08/2018 Arrival Information Details Patient Name: [name], [name] Date of Service: 01/08/2018 12:30 PM Medical Record Number: [id number] Patient Account Number: [id number] Date of Birth/Sex: 04-05-1930 (84 y.o. Female) Treating RN: Roger Shelter Primary Care Emersen Carroll: Priscille Kluver Other Clinician: Referring Vidalia Serpas: Priscille Kluver Treating Analilia Geddis/Extender: Cathie Olden in Treatment: 13 Visit Information History Since Last Visit All ordered tests and consults were completed: No Patient Arrived: Wheel Chair Added or deleted any medications: No Arrival Time: 12:42 Any new allergies or adverse reactions: No Accompanied By: caregiver Had a fall or experienced change in No Transfer Assistance: EasyPivot Patient activities of daily living that may affect Lift risk of falls: Patient Identification Verified: Yes Signs or symptoms of abuse/neglect since last visito No Secondary Verification Process Yes Hospitalized since last visit: No Completed: Pain Present Now: No Patient Requires Transmission-Based No Precautions: Patient Has Alerts: No Electronic Signature(s) Signed: 01/08/2018 2:56:37 PM By: Roger Shelter Entered By: Roger Shelter on 01/08/2018 12:44:13 [name], [name]. ([id number]) -------------------------------------------------------------------------------- Encounter Discharge Information Details Patient Name: [name], [name] Date of Service: 01/08/2018 12:30 PM Medical Record Number: [id number] Patient Account Number: [id number] Date of Birth/Sex: 02/13/1930 (84 y.o. Female) Treating RN: Roger Shelter Primary Care Jermone Geister: Priscille Kluver Other Clinician: Referring Shanya Ferriss: Priscille Kluver Treating Tajah Noguchi/Extender: Cathie Olden in Treatment: 13 Encounter Discharge Information Items Discharge Pain Level: 0 Discharge Condition: Stable Ambulatory Status: Wheelchair Discharge Destination:  Home Transportation: Private Auto Accompanied By: caregiver Schedule Follow-up Appointment: Yes Medication Reconciliation completed and No provided to Patient/Care Araeya Lamb: Provided on Clinical Summary of Care: 01/08/2018 Form Type Recipient Paper Patient ra Electronic Signature(s) Signed: 01/08/2018 1:56:47 PM By: Lorine Bears RCP, RRT, CHT Entered By: Lorine Bears on 01/08/2018 13:56:46 [name], [name] ([id number]) -------------------------------------------------------------------------------- Lower Extremity Assessment Details Patient Name: [name], [name] Date of Service: 01/08/2018 12:30 PM Medical Record Number: [id number] Patient Account Number: [id number] Date of Birth/Sex: 01/09/1930 (84 y.o. Female) Treating RN: Roger Shelter Primary Care Blayke Cordrey: Priscille Kluver Other Clinician: Referring Aiven Kampe: Priscille Kluver Treating Carlee Vonderhaar/Extender: Cathie Olden in Treatment: 13 Electronic Signature(s) Signed: 01/08/2018 2:56:37 PM By: Roger Shelter Entered By: Roger Shelter on 01/08/2018 12:52:22 [name], [name] ([id number]) -------------------------------------------------------------------------------- Multi Wound Chart Details Patient Name: [name], [name] Date of Service: 01/08/2018 12:30 PM Medical Record Number: [id number] Patient Account Number: [id number] Date of Birth/Sex: 01/22/1930 (84 y.o. Female) Treating RN: Roger Shelter Primary Care Ottis Vacha: Priscille Kluver Other Clinician: Referring Gumaro Brightbill: Priscille Kluver Treating Rashan Rounsaville/Extender: Cathie Olden in Treatment: 13 Vital Signs Height(in): 64 Pulse(bpm): 64 Weight(lbs): 200 Blood Pressure(mmHg): 138/51 Body Mass Index(BMI): 34 Temperature(F): 97.6 Respiratory Rate 16 (breaths/min): Photos: [1:No Photos] [N/A:N/A] Wound Location: [1:Sacrum] [N/A:N/A] Wounding Event: [1:Pressure Injury] [N/A:N/A] Primary Etiology: [1:Pressure Ulcer]  [N/A:N/A] Comorbid History: [1:Anemia, Congestive Heart Failure, Hypertension, Osteoarthritis, Osteomyelitis, Received Chemotherapy, Received Radiation] [N/A:N/A] Date Acquired: [1:07/14/2017] [N/A:N/A] Weeks of Treatment: [1:13] [N/A:N/A] Wound Status: [1:Open] [N/A:N/A] Measurements L x W x D [1:2x0.8x0.9] [N/A:N/A] (cm) Area (cm) : [1:1.257] [N/A:N/A] Volume (cm) : [1:1.131] [N/A:N/A] % Reduction in Area: [1:-11.10%] [N/A:N/A] % Reduction in Volume: [1:23.10%] [N/A:N/A] Starting Position 1 [1:4] (o'clock): Ending Position 1 [1:9] (o'clock): Maximum Distance 1 (cm): [1:2.9] Undermining: [1:Yes] [N/A:N/A] Classification: [1:Category/Stage IV] [N/A:N/A] Exudate Amount: [1:Large] [N/A:N/A] Exudate Type: [1:Serous] [N/A:N/A] Exudate Color: [1:amber] [N/A:N/A] Wound Margin: [1:Flat and Intact] [N/A:N/A] Granulation Amount: [1:Medium (34-66%)] [N/A:N/A] Granulation Quality: [1:Red] [N/A:N/A] Necrotic Amount: [1:Medium (34-66%)] [N/A:N/A] Necrotic Tissue: [1:Eschar, Adherent Slough] [N/A:N/A] Exposed Structures: [1:Fascia: Yes Fat Layer (Subcutaneous Tissue) Exposed:  Yes Tendon: Yes] [N/A:N/A] Bone: Yes Muscle: No Joint: No Epithelialization: None N/A N/A Debridement: Debridement ([id number]) N/A N/A Pre-procedure 13:02 N/A N/A Verification/Time Out Taken: Pain Control: Other N/A N/A Tissue Debrided: Fibrin/Slough, Skin, N/A N/A Subcutaneous Level: Skin/Subcutaneous Tissue N/A N/A Debridement Area (sq cm): 1.6 N/A N/A Instrument: Curette N/A N/A Bleeding: Moderate N/A N/A Hemostasis Achieved: Pressure N/A N/A Procedural Pain: 0 N/A N/A Post Procedural Pain: 0 N/A N/A Debridement Treatment Procedure was tolerated well N/A N/A Response: Post Debridement 2x0.8x1 N/A N/A Measurements L x W x D (cm) Post Debridement Volume: 1.257 N/A N/A (cm) Post Debridement Stage: Category/Stage IV N/A N/A Periwound Skin Texture: Scarring: Yes N/A N/A Excoriation: No Induration:  No Callus: No Crepitus: No Rash: No Periwound Skin Moisture: Maceration: Yes N/A N/A Dry/Scaly: No Periwound Skin Color: Atrophie Blanche: No N/A N/A Cyanosis: No Ecchymosis: No Erythema: No Hemosiderin Staining: No Mottled: No Pallor: No Rubor: No Temperature: No Abnormality N/A N/A Tenderness on Palpation: Yes N/A N/A Wound Preparation: Ulcer Cleansing: N/A N/A Rinsed/Irrigated with Saline Topical Anesthetic Applied: Other: lidocaine 4% Procedures Performed: Debridement N/A N/A Treatment Notes Electronic Signature(s) Signed: 01/08/2018 1:25:02 PM By: Lawanda Cousins Entered By: Lawanda Cousins on 01/08/2018 13:25:02 [place], [place] L. ([id number]) [place], [place]. ([id number]) -------------------------------------------------------------------------------- [place] Details Patient Name: [name], [name] Date of Service: 01/08/2018 12:30 PM Medical Record Number: [id number] Patient Account Number: [id number] Date of Birth/Sex: [birth year] (84 y.o. Female) Treating RN: Roger Shelter Primary Care Anabia Weatherwax: Priscille Kluver Other Clinician: Referring Darrill Vreeland: Priscille Kluver Treating Janicia Monterrosa/Extender: Cathie Olden in Treatment: 13 Active Inactive ` Abuse / Safety / Falls / Self Care Management Nursing Diagnoses: Potential for falls Goals: Patient will not experience any injury related to falls Date Initiated: 10/09/2017 Target Resolution Date: 01/10/2018 Goal Status: Active Interventions: Assess fall risk on admission and as needed Notes: ` Orientation to the Wound Care Program Nursing Diagnoses: Knowledge deficit related to the wound healing center program Goals: Patient/caregiver will verbalize understanding of the [facility] Date Initiated: 10/09/2017 Target Resolution Date: 01/10/2018 Goal Status: Active Interventions: Provide education on orientation to the wound center Notes: ` Pressure Nursing Diagnoses: Potential  for impaired tissue integrity related to pressure, friction, moisture, and shear Goals: Patient will remain free from development of additional pressure ulcers Date Initiated: 10/09/2017 Target Resolution Date: 01/10/2018 Goal Status: Active Interventions: Apurva, Reily Aubriana L. ([id number]) Assess offloading mechanisms upon admission and as needed Provide education on pressure ulcers Notes: ` Wound/Skin Impairment Nursing Diagnoses: Impaired tissue integrity Goals: Ulcer/skin breakdown will heal within 14 weeks Date Initiated: 10/09/2017 Target Resolution Date: 01/10/2018 Goal Status: Active Interventions: Assess patient/caregiver ability to obtain necessary supplies Assess patient/caregiver ability to perform ulcer/skin care regimen upon admission and as needed Assess ulceration(s) every visit Notes: Electronic Signature(s) Signed: 01/08/2018 2:56:37 PM By: Roger Shelter Entered By: Roger Shelter on 01/08/2018 13:00:29 [place], [place] ([id number]) -------------------------------------------------------------------------------- Pain Assessment Details Patient Name: [name], [name] Date of Service: 01/08/2018 12:30 PM Medical Record Number: [id number] Patient Account Number: [id number] Date of Birth/Sex: [birth year] (84 y.o. Female) Treating RN: Roger Shelter Primary Care Ligaya Cormier: Priscille Kluver Other Clinician: Referring Caylen Kuwahara: Priscille Kluver Treating Domingos Riggi/Extender: Cathie Olden in Treatment: 13 Active Problems Location of Pain Severity and Description of Pain Patient Has Paino No Site Locations Pain Management and Medication Current Pain Management: Electronic Signature(s) Signed: 01/08/2018 2:56:37 PM By: Roger Shelter Entered By: Roger Shelter on 01/08/2018 12:44:21 [place], [place] ([id number]) -------------------------------------------------------------------------------- Patient/Caregiver Education Details Patient Name: [name], Netra L.  Date  of Service: 01/08/2018 12:30 PM Medical Record Number: [id number] Patient Account Number: [id number] Date of Birth/Gender: 02/13/1930 (84 y.o. Female) Treating RN: Roger Shelter Primary Care Physician: Priscille Kluver Other Clinician: Referring Physician: Priscille Kluver Treating Physician/Extender: Cathie Olden in Treatment: 13 Education Assessment Education Provided To: Patient and Caregiver Education Topics Provided Wound/Skin Impairment: Handouts: Caring for Your Ulcer, Other: change dressing as ordered Methods: Demonstration, Explain/Verbal Responses: State content correctly Electronic Signature(s) Signed: 01/08/2018 2:56:37 PM By: Roger Shelter Entered By: Roger Shelter on 01/08/2018 13:55:59 [place], [place] ([id number]) -------------------------------------------------------------------------------- Wound Assessment Details Patient Name: [name], [name] Date of Service: 01/08/2018 12:30 PM Medical Record Number: [id number] Patient Account Number: [id number] Date of Birth/Sex: 1930-07-11 (83 y.o. Female) Treating RN: Roger Shelter Primary Care Blair Mesina: Priscille Kluver Other Clinician: Referring Haitham Dolinsky: Priscille Kluver Treating Vannie Hilgert/Extender: Cathie Olden in Treatment: 13 Wound Status Wound Number: 1 Primary Pressure Ulcer Etiology: Wound Location: Sacrum Wound Open Wounding Event: Pressure Injury Status: Date Acquired: 07/14/2017 Comorbid Anemia, Congestive Heart Failure, Weeks Of Treatment: 13 History: Hypertension, Osteoarthritis, Osteomyelitis, Clustered Wound: No Received Chemotherapy, Received Radiation Photos Photo Uploaded By: Roger Shelter on 01/08/2018 14:57:25 Wound Measurements Length: (cm) 2 Width: (cm) 0.8 Depth: (cm) 0.9 Area: (cm) 1.257 Volume: (cm) 1.131 % Reduction in Area: -11.1% % Reduction in Volume: 23.1% Epithelialization: None Tunneling: No Undermining: Yes Starting Position (o'clock): 4 Ending Position  (o'clock): 9 Maximum Distance: (cm) 2.9 Wound Description Classification: Category/Stage IV Wound Margin: Flat and Intact Exudate Amount: Large Exudate Type: Serous Exudate Color: amber Foul Odor After Cleansing: No Slough/Fibrino Yes Wound Bed Granulation Amount: Medium (34-66%) Exposed Structure Granulation Quality: Red Fascia Exposed: Yes Necrotic Amount: Medium (34-66%) Fat Layer (Subcutaneous Tissue) Exposed: Yes Necrotic Quality: Eschar, Adherent Slough Tendon Exposed: Yes Muscle Exposed: No [name], [name] ([id number]) Joint Exposed: No Bone Exposed: Yes Periwound Skin Texture Texture Color No Abnormalities Noted: No No Abnormalities Noted: No Callus: No Atrophie Blanche: No Crepitus: No Cyanosis: No Excoriation: No Ecchymosis: No Induration: No Erythema: No Rash: No Hemosiderin Staining: No Scarring: Yes Mottled: No Pallor: No Moisture Rubor: No No Abnormalities Noted: No Dry / Scaly: No Temperature / Pain Maceration: Yes Temperature: No Abnormality Tenderness on Palpation: Yes Wound Preparation Ulcer Cleansing: Rinsed/Irrigated with Saline Topical Anesthetic Applied: Other: lidocaine 4%, Treatment Notes Wound #1 (Sacrum) 1. Cleansed with: Clean wound with Normal Saline 2. Anesthetic Topical Lidocaine 4% cream to wound bed prior to debridement 3. Peri-wound Care: Skin Prep 4. Dressing Applied: Prisma Ag 8. Negative Pressure Wound Therapy Wound Vac to wound continuously at 129mm/hg pressure Black Foam Number of foam/gauze pieces used in the dressing = Electronic Signature(s) Signed: 01/08/2018 2:56:37 PM By: Roger Shelter Entered By: Roger Shelter on 01/08/2018 12:59:06 Marton, Adonna L. ([id number]) -------------------------------------------------------------------------------- Vitals Details Patient Name: [name], Riyanna L. Date of Service: 01/08/2018 12:30 PM Medical Record Number: [id number] Patient Account Number:  [id number] Date of Birth/Sex: 11/27/1930 (84 y.o. Female) Treating RN: Roger Shelter Primary Care Adamariz Gillott: Priscille Kluver Other Clinician: Referring Willford Rabideau: Priscille Kluver Treating Halyn Flaugher/Extender: Cathie Olden in Treatment: 13 Vital Signs Time Taken: 12:44 Temperature (F): 97.6 Height (in): 64 Pulse (bpm): 51 Weight (lbs): 200 Respiratory Rate (breaths/min): 16 Body Mass Index (BMI): 34.3 Blood Pressure (mmHg): 138/51 Reference Range: 80 - 120 mg / dl Electronic Signature(s) Signed: 01/08/2018 2:56:37 PM By: Roger Shelter Entered By: Roger Shelter on 01/08/2018 12:44:48

## 2018-01-22 ENCOUNTER — Encounter: Payer: Medicare Other | Admitting: Nurse Practitioner

## 2018-01-22 DIAGNOSIS — L89154 Pressure ulcer of sacral region, stage 4: Secondary | ICD-10-CM | POA: Diagnosis not present

## 2018-01-25 NOTE — Progress Notes (Signed)
CASSY, SPROWL ([id number]) Visit Report for 01/22/2018 Arrival Information Details Patient Name: [name], [name] Date of Service: 01/22/2018 2:00 PM Medical Record Number: [id number] Patient Account Number: [id number] Date of Birth/Sex: February 23, 1930 (84 y.o. Female) Treating RN: [name] Primary Care [name]: [name] Other Clinician: Referring [name]: [name] Treating [name]/Extender: [name] in Treatment: 15 Visit Information History Since Last Visit Added or deleted any medications: No Patient Arrived: Wheel Chair Any new allergies or adverse reactions: No Arrival Time: 14:11 Had a fall or experienced change in No activities of daily living that may affect Accompanied By: caregiver risk of falls: Transfer Assistance: Manual Signs or symptoms of abuse/neglect since last visito No Patient Identification Verified: Yes Hospitalized since last visit: No Secondary Verification Process Completed: Yes Has Dressing in Place as Prescribed: Yes Patient Requires Transmission-Based No Pain Present Now: No Precautions: Patient Has Alerts: No Electronic Signature(s) Signed: 01/22/2018 5:33:45 PM By: [name] Entered By: [name] on 01/22/2018 14:11:57 [place], [name]. ([id number]) -------------------------------------------------------------------------------- Encounter Discharge Information Details Patient Name: [name] Hansen, [name] Date of Service: 01/22/2018 2:00 PM Medical Record Number: [id number] Patient Account Number: [id number] Date of Birth/Sex: 18-Jun-1930 (84 y.o. Female) Treating RN: [name] Primary Care Charne Mcbrien: [name] Other Clinician: Referring [name]: [name] Treating [name]/Extender: [name] in Treatment: 15 Encounter Discharge Information Items Discharge Pain Level: 0 Discharge Condition: Stable Ambulatory Status: Wheelchair Discharge Destination: Home Transportation: Private  Auto Accompanied By: daughter Schedule Follow-up Appointment: Yes Medication Reconciliation completed and No provided to Patient/Care [name]: Provided on Clinical Summary of Care: 01/22/2018 Form Type Recipient Paper Patient [name] Electronic Signature(s) Signed: 01/23/2018 4:58:29 PM By: [name] Entered By: [name] on 01/22/2018 14:49:38 [place], [place] ([id number]) -------------------------------------------------------------------------------- Multi Wound Chart Details Patient Name: [name] Hansen, [name] Date of Service: 01/22/2018 2:00 PM Medical Record Number: [id number] Patient Account Number: [id number] Date of Birth/Sex: Apr 25, 1930 (84 y.o. Female) Treating RN: [name] Primary Care Aidin Doane: [name] Other Clinician: Referring Zunairah Devers: [name] Treating Angeni Chaudhuri/Extender: [name] in Treatment: 15 Vital Signs Height(in): 64 Pulse(bpm): 65 Weight(lbs): 200 Blood Pressure(mmHg): 193/49 Body Mass Index(BMI): 34 Temperature(F): 97.8 Respiratory Rate 16 (breaths/min): Photos: [1:No Photos] [N/A:N/A] Wound Location: [1:Sacrum] [N/A:N/A] Wounding Event: [1:Pressure Injury] [N/A:N/A] Primary Etiology: [1:Pressure Ulcer] [N/A:N/A] Comorbid History: [1:Anemia, Congestive Heart Failure, Hypertension, Osteoarthritis, Osteomyelitis, Received Chemotherapy, Received Radiation] [N/A:N/A] Date Acquired: [1:07/14/2017] [N/A:N/A] Weeks of Treatment: [1:15] [N/A:N/A] Wound Status: [1:Open] [N/A:N/A] Measurements L x W x D [1:1.5x0.9x1.1] [N/A:N/A] (cm) Area (cm) : [1:1.06] [N/A:N/A] Volume (cm) : [1:1.166] [N/A:N/A] % Reduction in Area: [1:6.30%] [N/A:N/A] % Reduction in Volume: [1:20.70%] [N/A:N/A] Starting Position 1 [1:1] (o'clock): Ending Position 1 [1:1] (o'clock): Maximum Distance 1 (cm): [1:3] Undermining: [1:Yes] [N/A:N/A] Classification: [1:Category/Stage IV] [N/A:N/A] Exudate Amount: [1:Large] [N/A:N/A] Exudate Type:  [1:Serous] [N/A:N/A] Exudate Color: [1:amber] [N/A:N/A] Wound Margin: [1:Flat and Intact] [N/A:N/A] Granulation Amount: [1:Large (67-100%)] [N/A:N/A] Granulation Quality: [1:Red] [N/A:N/A] Necrotic Amount: [1:Small (1-33%)] [N/A:N/A] Necrotic Tissue: [1:Eschar, Adherent Slough] [N/A:N/A] Exposed Structures: [1:Fascia: Yes Fat Layer (Subcutaneous Tissue) Exposed: Yes Tendon: Yes] [N/A:N/A] Bone: Yes Muscle: No Joint: No Epithelialization: None N/A N/A Debridement: Debridement ([id number]) N/A N/A Pre-procedure 14:32 N/A N/A Verification/Time Out Taken: Pain Control: Lidocaine 4% Topical Solution N/A N/A Tissue Debrided: Fibrin/Slough, Exudates, N/A N/A Subcutaneous Level: Skin/Subcutaneous Tissue N/A N/A Debridement Area (sq cm): 1.35 N/A N/A Instrument: Curette N/A N/A Bleeding: Minimum N/A N/A Hemostasis Achieved: Pressure N/A N/A Procedural Pain: 0 N/A N/A Post Procedural Pain: 0 N/A N/A Debridement Treatment Procedure was tolerated  well N/A N/A Response: Post Debridement 1.5x0.9x1.2 N/A N/A Measurements L x W x D (cm) Post Debridement Volume: 1.272 N/A N/A (cm) Post Debridement Stage: Category/Stage IV N/A N/A Periwound Skin Texture: Scarring: Yes N/A N/A Excoriation: No Induration: No Callus: No Crepitus: No Rash: No Periwound Skin Moisture: Maceration: Yes N/A N/A Dry/Scaly: No Periwound Skin Color: Atrophie Blanche: No N/A N/A Cyanosis: No Ecchymosis: No Erythema: No Hemosiderin Staining: No Mottled: No Pallor: No Rubor: No Temperature: No Abnormality N/A N/A Tenderness on Palpation: Yes N/A N/A Wound Preparation: Ulcer Cleansing: N/A N/A Rinsed/Irrigated with Saline Topical Anesthetic Applied: Other: lidocaine 4% Procedures Performed: Debridement N/A N/A Treatment Notes Wound #1 (Sacrum) 1. Cleansed with: Clean wound with Normal Saline 2. Anesthetic Topical Lidocaine 4% cream to wound bed prior to debridement 4. Dressing Applied: [name],  [name] ([id number]) Prisma Ag 5. Secondary Dressing Applied Bordered Foam Dressing Electronic Signature(s) Signed: 01/22/2018 3:00:35 PM By: [name] Entered By: [name] on 01/22/2018 15:00:34 [place], [place] ([id number]) -------------------------------------------------------------------------------- [place] Details Patient Name: [name] Hansen, [name] Date of Service: 01/22/2018 2:00 PM Medical Record Number: [id number] Patient Account Number: [id number] Date of Birth/Sex: 1929/12/06 (84 y.o. Female) Treating RN: [name] Primary Care Bryten Maher: [name] Other Clinician: Referring Bilaal Leib: [name] Treating Kimiyah Blick/Extender: [name] in Treatment: 15 Active Inactive ` Abuse / Safety / Falls / Self Care Management Nursing Diagnoses: Potential for falls Goals: Patient will not experience any injury related to falls Date Initiated: 10/09/2017 Target Resolution Date: 01/10/2018 Goal Status: Active Interventions: Assess fall risk on admission and as needed Notes: ` Orientation to the Wound Care Program Nursing Diagnoses: Knowledge deficit related to the wound healing center program Goals: Patient/caregiver will verbalize understanding of the [facility] Date Initiated: 10/09/2017 Target Resolution Date: 01/10/2018 Goal Status: Active Interventions: Provide education on orientation to the wound center Notes: ` Pressure Nursing Diagnoses: Potential for impaired tissue integrity related to pressure, friction, moisture, and shear Goals: Patient will remain free from development of additional pressure ulcers Date Initiated: 10/09/2017 Target Resolution Date: 01/10/2018 Goal Status: Active Interventions: Elexis, Pollak Cilicia L. ([id number]) Assess offloading mechanisms upon admission and as needed Provide education on pressure ulcers Notes: ` Wound/Skin Impairment Nursing Diagnoses: Impaired tissue  integrity Goals: Ulcer/skin breakdown will heal within 14 weeks Date Initiated: 10/09/2017 Target Resolution Date: 01/10/2018 Goal Status: Active Interventions: Assess patient/caregiver ability to obtain necessary supplies Assess patient/caregiver ability to perform ulcer/skin care regimen upon admission and as needed Assess ulceration(s) every visit Notes: Electronic Signature(s) Signed: 01/23/2018 8:00:35 AM By: Alric Quan Entered By: Alric Quan on 01/22/2018 14:29:53 Hashimi, [name] ([id number]) -------------------------------------------------------------------------------- Pain Assessment Details Patient Name: [name] Hansen, Nivedita L. Date of Service: 01/22/2018 2:00 PM Medical Record Number: [id number] Patient Account Number: [id number] Date of Birth/Sex: Apr 13, 1930 (84 y.o. Female) Treating RN: [name] Primary Care Rhyan Wolters: [name] Other Clinician: Referring Sumaiya Arruda: [name] Treating Keniesha Adderly/Extender: [name] in Treatment: 15 Active Problems Location of Pain Severity and Description of Pain Patient Has Paino No Site Locations Pain Management and Medication Current Pain Management: Electronic Signature(s) Signed: 01/22/2018 5:33:45 PM By: [name] Entered By: [name] on 01/22/2018 14:12:05 Val, [name] ([id number]) -------------------------------------------------------------------------------- Patient/Caregiver Education Details Patient Name: [name] Hansen, [name] Date of Service: 01/22/2018 2:00 PM Medical Record Number: [id number] Patient Account Number: [id number] Date of Birth/Gender: 1930/03/23 (84 y.o. Female) Treating RN: [name] Primary Care Physician: [name] Other Clinician: Referring Physician: Priscille [name] Treating Physician/Extender: [name] in Treatment: 15  Education Assessment Education Provided To: Patient and Caregiver Education Topics Provided Wound Debridement: Methods:  Explain/Verbal Responses: State content correctly Wound/Skin Impairment: Handouts: Caring for Your Ulcer Methods: Explain/Verbal Responses: State content correctly Electronic Signature(s) Signed: 01/23/2018 4:58:29 PM By: [name] Entered By: [name] on 01/22/2018 14:49:56 [place], [place] ([id number]) -------------------------------------------------------------------------------- Wound Assessment Details Patient Name: Wissink, [name] Date of Service: 01/22/2018 2:00 PM Medical Record Number: [id number] Patient Account Number: [id number] Date of Birth/Sex: 14-Apr-1930 (84 y.o. Female) Treating RN: [name] Primary Care Estiben Mizuno: [name] Other Clinician: Referring Sadat Sliwa: [name] Treating Jadarius Commons/Extender: [name] in Treatment: 15 Wound Status Wound Number: 1 Primary Pressure Ulcer Etiology: Wound Location: Sacrum Wound Open Wounding Event: Pressure Injury Status: Date Acquired: 07/14/2017 Comorbid Anemia, Congestive Heart Failure, Weeks Of Treatment: 15 History: Hypertension, Osteoarthritis, Osteomyelitis, Clustered Wound: No Received Chemotherapy, Received Radiation Photos Photo Uploaded By: [name] on 01/23/2018 11:12:12 Wound Measurements Length: (cm) 1.5 Width: (cm) 0.9 Depth: (cm) 1.1 Area: (cm) 1.06 Volume: (cm) 1.166 % Reduction in Area: 6.3% % Reduction in Volume: 20.7% Epithelialization: None Tunneling: No Undermining: Yes Starting Position (o'clock): 1 Ending Position (o'clock): 1 Maximum Distance: (cm) 3 Wound Description Classification: Category/Stage IV Wound Margin: Flat and Intact Exudate Amount: Large Exudate Type: Serous Exudate Color: amber Foul Odor After Cleansing: No Slough/Fibrino Yes Wound Bed Granulation Amount: Large (67-100%) Exposed Structure Granulation Quality: Red Fascia Exposed: Yes Necrotic Amount: Small (1-33%) Fat Layer (Subcutaneous Tissue) Exposed: Yes Necrotic  Quality: Eschar, Adherent Slough Tendon Exposed: Yes Muscle Exposed: No Liese, [name] ([id number]) Joint Exposed: No Bone Exposed: Yes Periwound Skin Texture Texture Color No Abnormalities Noted: No No Abnormalities Noted: No Callus: No Atrophie Blanche: No Crepitus: No Cyanosis: No Excoriation: No Ecchymosis: No Induration: No Erythema: No Rash: No Hemosiderin Staining: No Scarring: Yes Mottled: No Pallor: No Moisture Rubor: No No Abnormalities Noted: No Dry / Scaly: No Temperature / Pain Maceration: Yes Temperature: No Abnormality Tenderness on Palpation: Yes Wound Preparation Ulcer Cleansing: Rinsed/Irrigated with Saline Topical Anesthetic Applied: Other: lidocaine 4%, Treatment Notes Wound #1 (Sacrum) 1. Cleansed with: Clean wound with Normal Saline 2. Anesthetic Topical Lidocaine 4% cream to wound bed prior to debridement 4. Dressing Applied: Prisma Ag 5. Secondary Dressing Applied Bordered Foam Dressing Electronic Signature(s) Signed: 01/22/2018 5:33:45 PM By: [name] Entered By: [name] on 01/22/2018 14:20:29 [place], [name] ([id number]) -------------------------------------------------------------------------------- Vit[place] Details Patient Name: [name] Hansen, [name] Date of Service: 01/22/2018 2:00 PM Medical Record Number: [id number] Patient Account Number: [id number] Date of Birth/Sex: 09-29-1930 (84 y.o. Female) Treating RN: [name] Primary Care Monike Bragdon: [name] Other Clinician: Referring Alithia Zavaleta: [name] Treating Shawntay Prest/Extender: [name] in Treatment: 15 Vital Signs Time Taken: 14:12 Temperature (F): 97.8 Height (in): 64 Pulse (bpm): 65 Weight (lbs): 200 Respiratory Rate (breaths/min): 16 Body Mass Index (BMI): 34.3 Blood Pressure (mmHg): 193/49 Reference Range: 80 - 120 mg / dl Electronic Signature(s) Signed: 01/22/2018 5:33:45 PM By: [name] Entered By: [name] on  01/22/2018 14:12:31

## 2018-01-27 NOTE — Progress Notes (Addendum)
[name] ([id number]) Visit Report for 01/22/2018 Chief Complaint Document Details Patient Name: [name], [name] Date of Service: 01/22/2018 2:00 PM Medical Record Number: [id number] Patient Account Number: [id number] Date of Birth/Sex: May 04, 1930 (84 y.o. Female) Treating RN: Ahmed Prima Primary Care Provider: Priscille Kluver Other Clinician: Referring Provider: Priscille Kluver Treating Provider/Extender: Cathie Olden in Treatment: 15 Information Obtained from: Patient Chief Complaint she is here in follow-up evaluation of a sacral ulcer Electronic Signature(s) Signed: 01/22/2018 3:04:01 PM By: Lawanda Cousins Entered By: Lawanda Cousins on 01/22/2018 15:04:01 [place], [place] ([id number]) -------------------------------------------------------------------------------- Debridement Details Patient Name: [name], [name] Date of Service: 01/22/2018 2:00 PM Medical Record Number: [id number] Patient Account Number: [id number] Date of Birth/Sex: 1930-05-20 (84 y.o. Female) Treating RN: Carolyne Fiscal, Debi Primary Care Provider: Priscille Kluver Other Clinician: Referring Provider: Priscille Kluver Treating Provider/Extender: Cathie Olden in Treatment: 15 Debridement Performed for Wound #1 Sacrum Assessment: Performed By: Physician Lawanda Cousins, NP Debridement: Debridement Pre-procedure Verification/Time Yes - 14:32 Out Taken: Start Time: 14:33 Pain Control: Lidocaine 4% Topical Solution Level: Skin/Subcutaneous Tissue Total Area Debrided (L x W): 1.5 (cm) x 0.9 (cm) = 1.35 (cm) Tissue and other material Viable, Non-Viable, Exudate, Fibrin/Slough, Subcutaneous debrided: Instrument: Curette Bleeding: Minimum Hemostasis Achieved: Pressure End Time: 14:34 Procedural Pain: 0 Post Procedural Pain: 0 Response to Treatment: Procedure was tolerated well Post Debridement Measurements of Total Wound Length: (cm) 1.5 Stage: Category/Stage IV Width: (cm) 0.9 Depth: (cm)  1.2 Volume: (cm) 1.272 Character of Wound/Ulcer Post Requires Further Debridement Debridement: Post Procedure Diagnosis Same as Pre-procedure Electronic Signature(s) Signed: 01/22/2018 5:45:09 PM By: Lawanda Cousins Signed: 01/23/2018 8:00:35 AM By: Alric Quan Entered By: Alric Quan on 01/22/2018 14:34:59 [name] ([id number]) -------------------------------------------------------------------------------- HPI Details Patient Name: [name], [name] Date of Service: 01/22/2018 2:00 PM Medical Record Number: [id number] Patient Account Number: [id number] Date of Birth/Sex: 1930-09-15 (84 y.o. Female) Treating RN: Carolyne Fiscal, Debi Primary Care Provider: Priscille Kluver Other Clinician: Referring Provider: Priscille Kluver Treating Provider/Extender: Cathie Olden in Treatment: 21 History of Present Illness HPI Description: 84 year old patient who has been referred to [place] for the sacral decubitus ulcer was admitted to the [facility] on 07/18/2017 for an acute sacral decubitus with osteomyelitis. She was treated there with IV antibiotics and surgical debridement was done on 08/06/2017 and transferred back to a swing bed 08/12/2017. She has completed antibiotics on 09/18/2017 and has been now living at home. Past medical history of GERD, fracture of right humerus, hypertension, rectal cancer, status post colostomy, laparoscopic cholecystectomy, debridement of sacral decubitus ulcer including subcutaneous tissue muscle and bone done on 08/06/2017, previous rectal cancer surgery in April 2017. Reviewing notes from the [facility] it was noted that she was treated for a MRSA sacral osteomyelitis and Proteus bacteremia requiring IV antibiotics which included clindamycin and vancomycin. Vancomycin had to be stopped on September 25 due to neutropenia and thrombocytopenia and changed to daptomycin until 09/20/2017. Infectious disease then advised to switch to clindamycin  and the PICC line was removed on 09/22/2017. review of the electronic medical records noted that cultures of bone and tissue taken from the OR for wound debridement on 95 grew MRSA and she was started on IV antibiotics initially clindamycin and switched to IV vancomycin and the Proteus bacteremia was stained treated with by mouth levofloxacin and Flagyl for 6 weeks to end on 09/03/2017. She was also advised an offloading mattress and would follow-up with supportive care 10/17/2017 -- the patient is ambulating a bit with the help  of OT and PT and she is also eating much better. She has got inappropriate air mattress in place. 11/06/2017 -- the patient is a bit more ambivalent and continues to work with her mobilization. She is not yet ready for a wound VAC. 11/20/17 she is here in follow-up evaluation of a sacral ulcer. Wound VAC has not been improved, will continue with Santyl and anticipate that she can transfer to wound VAC at next appointment, bone fragments present in wound, no indication of infection. 12/19/17 on evaluation today patient sacral wound appears to be doing decently well. She still has some bone exposure although this is minimal compared to previous in fact it just feels rough along the region of the bone or at least where this was this appears to have covered over with granulation for the most part. Nonetheless the wound measurements otherwise have not really significantly changed. We are going to be initiating the Wound VAC today. No fevers, chills, nausea, or vomiting noted at this time. 12/25/17-she is here in follow-up evaluation of the sacral stage IV pressure ulcer. She is tolerating negative pressure wound therapy. She does admit to prolonged sitting which we have discussed at length modified ways to offload and exercise. We will continue with negative pressure wound therapy and follow-up next week 01/08/18-she is here in follow-up evaluation for sacral stage IV pressure  ulcer. She did not make last week's appointment secondary to being hospitalized for electrolyte imbalance: she states the negative pressure wound therapy was maintained throughout hospitalization. She is unable to adequately offload per her admission which is primarily contributing to poor healing. She does have pressure relief chair cushions that she admits to using. We'll add Prisma, in conjunction with negative pressure. She is requesting a 2 week follow-up. 01/22/18-she is here in follow-up evaluation for his sacral stage IV pressure ulcer. Essentially unchanged in measurements with persistent circumferential undermining. She continues to have limited mobility and suboptimal pressure relief. We will continue with Prisma and negative pressure wound therapy. If there continues to be narrowing of the external opening with no change in the circumferential undermining we may need to abort the negative pressure wound therapy Electronic Signature(s) Signed: 01/22/2018 3:30:55 PM By: Lawanda Cousins Previous Signature: 01/22/2018 3:04:41 PM Version By: Myrtha Mantis, [place] ([id number]) Entered By: Lawanda Cousins on 01/22/2018 15:30:55 [name], [name] ([id number]) -------------------------------------------------------------------------------- Physician Orders Details Patient Name: [name], Raeleen L. Date of Service: 01/22/2018 2:00 PM Medical Record Number: [id number] Patient Account Number: [id number] Date of Birth/Sex: [birth year] (84 y.o. Female) Treating RN: Carolyne Fiscal, Debi Primary Care Provider: Priscille Kluver Other Clinician: Referring Provider: Priscille Kluver Treating Provider/Extender: Cathie Olden in Treatment: 15 Verbal / Phone Orders: Yes Clinician: Carolyne Fiscal, Debi Read Back and Verified: Yes Diagnosis Coding Wound Cleansing Wound #1 Sacrum o Clean wound with Normal Saline. Anesthetic (add to Medication List) Wound #1 Sacrum o Topical Lidocaine 4% cream applied to  wound bed prior to debridement (In Clinic Only). Skin Barriers/Peri-Wound Care Wound #1 Sacrum o Skin Prep Primary Wound Dressing Wound #1 Sacrum o Prisma Ag - moisten with saline (please pack into the undermining as well) Secondary Dressing o Boardered Foam Dressing - today in clinic prisma ag in wound and boarded foam dressing to the wound Dressing Change Frequency Wound #1 Sacrum o Other: - HHRN to go change dressing Tuesday, Thursday and Saturday every other week ***Starting next week*** on the opposite week the [facility] to go change the dressing on Tuesdays and Saturday and the pt will  be seen at the [facility] on Thursdays Follow-up Appointments Wound #1 Sacrum o Return Appointment in 1 week. Off-Loading Wound #1 Sacrum o Mattress - Continue air mattress o Turn and reposition every 2 hours Additional Orders / Instructions Wound #1 Sacrum o Increase protein intake. o Activity as tolerated o Other: - Please add vitamin A, vitamin C, multivitamin and zinc supplements to your diet - available over the [place], [place]. ([id number]) Wound #1 [place] [facility]its - [facility] to go change dressing Tuesday, Thursday and Saturday every other week ***Starting next week*** on the opposite week the [facility] to go change the dressing on Tuesdays and Saturday and the pt will be seen at the [place] on Thursdays o Home Health Nurse may visit PRN to address patientos wound care needs. o FACE TO FACE ENCOUNTER: MEDICARE and MEDICAID PATIENTS: I certify that this patient is under my care and that I had a face-to-face encounter that meets the physician face-to-face encounter requirements with this patient on this date. The encounter with the patient was in whole or in part for the following MEDICAL CONDITION: (primary reason for [place]) MEDICAL NECESSITY: I certify, that based on my findings, NURSING services are a  medically necessary home health service. HOME BOUND STATUS: I certify that my clinical findings support that this patient is homebound (i.e., Due to illness or injury, pt requires aid of supportive devices such as crutches, cane, wheelchairs, walkers, the use of special transportation or the assistance of another person to leave their place of residence. There is a normal inability to leave the home and doing so requires considerable and taxing effort. Other absences are for medical reasons / religious services and are infrequent or of short duration when for other reasons). o If current dressing causes regression in wound condition, may D/C ordered dressing product/s and apply Normal Saline Moist Dressing daily until next [place] / Other MD appointment. [place] of regression in wound condition at [phone]. o Please direct any NON-WOUND related issues/requests for orders to patient's Primary Care Physician Negative Pressure Wound Therapy Wound #1 Sacrum o Wound VAC settings at 125/130 mmHg continuous pressure. Use BLACK/GREEN foam to wound cavity. Use WHITE foam to fill any tunnel/s and/or undermining. Change VAC dressing 3 X WEEK. Change canister as indicated when full. Nurse may titrate settings and frequency of dressing changes as clinically indicated. - HHRN to go change dressing Tuesday, Thursday and Saturday every other week ***Starting next week*** on the opposite week the [facility] to go change the dressing on Tuesdays and Saturday and the pt will be seen at the [place] on Thursdays o Home Health Nurse may d/c [facility] for s/s of increased infection, significant wound regression, or uncontrolled drainage. [place] at [phone]. o Apply contact layer over base of wound. o Number of foam/gauze pieces used in the dressing = - 2 o Other: - Prisma Ag under the black foam to the wound bed and pack into the  undermining Patient Medications Allergies: No Known Allergies Notifications Medication Indication Start End lidocaine DOSE 1 - topical 4 % cream - 1 cream topical Electronic Signature(s) Signed: 01/23/2018 8:00:35 AM By: Alric Quan Signed: 01/26/2018 7:24:36 PM By: Lawanda Cousins Previous Signature: 01/22/2018 5:45:09 PM Version By: Lawanda Cousins Entered By: Alric Quan on 01/23/2018 07:54:15 [place], [name] ([id number]) -------------------------------------------------------------------------------- Prescription 01/22/2018 Patient Name: [name], [name] Provider: Lawanda Cousins NP Date of Birth:  03/23/1930 NPI#: [id number] Sex: F DEA#: [id number] Phone #: [phone] License #: Patient Address: [place] [address], [place], [place] [address] [place], [place] [address] [phone] Allergies No Known Allergies Medication Medication: Route: Strength: Form: lidocaine 4 % topical cream topical 4% cream Class: TOPICAL LOCAL ANESTHETICS Dose: Frequency / Time: Indication: 1 1 cream topical Number of Refills: Number of Units: 0 Generic Substitution: Start Date: End Date: One Time Use: Substitution Permitted No Note to Pharmacy: Signature(s): Date(s): Electronic Signature(s) Signed: 01/23/2018 8:00:35 AM By: Alric Quan Signed: 01/26/2018 7:24:36 PM By: Lawanda Cousins Previous Signature: 01/22/2018 5:45:09 PM Version By: Lawanda Cousins Entered By: Alric Quan on 01/23/2018 07:54:15 [place], [place] ([id number]) --------------------------------------------------------------------------------  Problem List Details Patient Name: [name], [name] Date of Service: 01/22/2018 2:00 PM Medical Record Number: [id number] Patient Account Number: [id number] Date of Birth/Sex: Jul 25, 1930 (84 y.o. Female) Treating RN: Ahmed Prima Primary Care  Provider: Priscille Kluver Other Clinician: Referring Provider: Priscille Kluver Treating Provider/Extender: Cathie Olden in Treatment: 15 Active Problems ICD-10 Encounter Code Description Active Date Diagnosis L89.154 Pressure ulcer of sacral region, stage 4 10/09/2017 Yes E44.0 Moderate protein-calorie malnutrition 10/09/2017 Yes Z99.3 Dependence on wheelchair 10/09/2017 Yes Inactive Problems Resolved Problems Electronic Signature(s) Signed: 01/22/2018 3:00:29 PM By: Lawanda Cousins Entered By: Lawanda Cousins on 01/22/2018 15:00:29 [place], [place] ([id number]) -------------------------------------------------------------------------------- Progress Note Details Patient Name: [name], [name] Date of Service: 01/22/2018 2:00 PM Medical Record Number: [id number] Patient Account Number: [id number] Date of Birth/Sex: 06-Nov-1930 (84 y.o. Female) Treating RN: Carolyne Fiscal, Debi Primary Care Provider: Priscille Kluver Other Clinician: Referring Provider: Priscille Kluver Treating Provider/Extender: Cathie Olden in Treatment: 15 Subjective Chief Complaint Information obtained from Patient she is here in follow-up evaluation of a sacral ulcer History of Present Illness (HPI) 84 year old patient who has been referred to [place] for the sacral decubitus ulcer was admitted to the [facility] on 07/18/2017 for an acute sacral decubitus with osteomyelitis. She was treated there with IV antibiotics and surgical debridement was done on 08/06/2017 and transferred back to a swing bed 08/12/2017. She has completed antibiotics on 09/18/2017 and has been now living at home. Past medical history of GERD, fracture of right humerus, hypertension, rectal cancer, status post colostomy, laparoscopic cholecystectomy, debridement of sacral decubitus ulcer including subcutaneous tissue muscle and bone done on 08/06/2017, previous rectal cancer surgery in April 2017. Reviewing notes from the [facility] it was  noted that she was treated for a MRSA sacral osteomyelitis and Proteus bacteremia requiring IV antibiotics which included clindamycin and vancomycin. Vancomycin had to be stopped on September 25 due to neutropenia and thrombocytopenia and changed to daptomycin until 09/20/2017. Infectious disease then advised to switch to clindamycin and the PICC line was removed on 09/22/2017. review of the electronic medical records noted that cultures of bone and tissue taken from the OR for wound debridement on 95 grew MRSA and she was started on IV antibiotics initially clindamycin and switched to IV vancomycin and the Proteus bacteremia was stained treated with by mouth levofloxacin and Flagyl for 6 weeks to end on 09/03/2017. She was also advised an offloading mattress and would follow-up with supportive care 10/17/2017 -- the patient is ambulating a bit with the help of OT and PT and she is also eating much better. She has got inappropriate air mattress in place. 11/06/2017 -- the patient is a bit more ambivalent and continues to work with  her mobilization. She is not yet ready for a wound VAC. 11/20/17 she is here in follow-up evaluation of a sacral ulcer. Wound VAC has not been improved, will continue with Santyl and anticipate that she can transfer to wound VAC at next appointment, bone fragments present in wound, no indication of infection. 12/19/17 on evaluation today patient sacral wound appears to be doing decently well. She still has some bone exposure although this is minimal compared to previous in fact it just feels rough along the region of the bone or at least where this was this appears to have covered over with granulation for the most part. Nonetheless the wound measurements otherwise have not really significantly changed. We are going to be initiating the Wound VAC today. No fevers, chills, nausea, or vomiting noted at this time. 12/25/17-she is here in follow-up evaluation of the sacral  stage IV pressure ulcer. She is tolerating negative pressure wound therapy. She does admit to prolonged sitting which we have discussed at length modified ways to offload and exercise. We will continue with negative pressure wound therapy and follow-up next week 01/08/18-she is here in follow-up evaluation for sacral stage IV pressure ulcer. She did not make last week's appointment secondary to being hospitalized for electrolyte imbalance: she states the negative pressure wound therapy was maintained throughout hospitalization. She is unable to adequately offload per her admission which is primarily contributing to poor healing. She does have pressure relief chair cushions that she admits to using. We'll add Prisma, in conjunction with negative pressure. She is requesting a 2 week follow-up. 01/22/18-she is here in follow-up evaluation for his sacral stage IV pressure ulcer. Essentially unchanged in measurements with persistent circumferential undermining. She continues to have limited mobility and suboptimal pressure relief. We will continue with Prisma and negative pressure wound therapy. If there continues to be narrowing of the external opening with no [name], [name] ([id number]) change in the circumferential undermining we may need to abort the negative pressure wound therapy Patient History Information obtained from Patient. Family History Cancer - Child, No family history of Diabetes, Heart Disease, Hereditary Spherocytosis, Hypertension, Kidney Disease, Lung Disease, Seizures, Stroke, Thyroid Problems, Tuberculosis. Social History Never smoker, Marital Status - Married, Alcohol Use - Never, Drug Use - No History, Caffeine Use - Daily. Medical And Surgical History Notes Gastrointestinal colostomy since 1990 Oncologic colorectal - 1990 Objective Constitutional Vitals Time Taken: 2:12 PM, Height: 64 in, Weight: 200 lbs, BMI: 34.3, Temperature: 97.8 F, Pulse: 65 bpm,  Respiratory Rate: 16 breaths/min, Blood Pressure: 193/49 mmHg. Integumentary (Hair, Skin) Wound #1 status is Open. Original cause of wound was Pressure Injury. The wound is located on the Sacrum. The wound measures 1.5cm length x 0.9cm width x 1.1cm depth; 1.06cm^2 area and 1.166cm^3 volume. There is bone, tendon, Fat Layer (Subcutaneous Tissue) Exposed, and fascia exposed. There is no tunneling noted, however, there is undermining starting at 1:00 and ending at 1:00 with a maximum distance of 3cm. There is a large amount of serous drainage noted. The wound margin is flat and intact. There is large (67-100%) red granulation within the wound bed. There is a small (1-33%) amount of necrotic tissue within the wound bed including Eschar and Adherent Slough. The periwound skin appearance exhibited: Scarring, Maceration. The periwound skin appearance did not exhibit: Callus, Crepitus, Excoriation, Induration, Rash, Dry/Scaly, Atrophie Blanche, Cyanosis, Ecchymosis, Hemosiderin Staining, Mottled, Pallor, Rubor, Erythema. Periwound temperature was noted as No Abnormality. The periwound has tenderness on palpation. Assessment Active Problems ICD-10 L89.154 -  Pressure ulcer of sacral region, stage 4 E44.0 - Moderate protein-calorie malnutrition Z99.3 - Dependence on wheelchair [place], [place] ([id number]) Procedures Wound #1 Pre-procedure diagnosis of Wound #1 is a Pressure Ulcer located on the Sacrum . There was a Skin/Subcutaneous Tissue Debridement ([id number]) debridement with total area of 1.35 sq cm performed by Lawanda Cousins, NP. with the following instrument(s): Curette to remove Viable and Non-Viable tissue/material including Exudate, Fibrin/Slough, and Subcutaneous after achieving pain control using Lidocaine 4% Topical Solution. A time out was conducted at 14:32, prior to the start of the procedure. A Minimum amount of bleeding was controlled with Pressure. The procedure was tolerated  well with a pain level of 0 throughout and a pain level of 0 following the procedure. Post Debridement Measurements: 1.5cm length x 0.9cm width x 1.2cm depth; 1.272cm^3 volume. Post debridement Stage noted as Category/Stage IV. Character of Wound/Ulcer Post Debridement requires further debridement. Post procedure Diagnosis Wound #1: Same as Pre-Procedure Plan Wound Cleansing: Wound #1 Sacrum: Clean wound with Normal Saline. Anesthetic (add to Medication List): Wound #1 Sacrum: Topical Lidocaine 4% cream applied to wound bed prior to debridement (In Clinic Only). Skin Barriers/Peri-Wound Care: Wound #1 Sacrum: Skin Prep Primary Wound Dressing: Wound #1 Sacrum: Prisma Ag - moisten with saline (please pack into the undermining as well) Secondary Dressing: Boardered Foam Dressing - today in clinic prisma ag in wound and boarded foam dressing to the wound Dressing Change Frequency: Wound #1 Sacrum: Other: - HHRN to go change dressing Tuesday, Thursday and Saturday every other week ***Starting next week*** on the opposite week the [facility] to go change the dressing on Tuesdays and Saturday and the pt will be seen at the [place] on Thursdays Follow-up Appointments: Wound #1 Sacrum: Return Appointment in 1 week. Off-Loading: Wound #1 Sacrum: Mattress - Continue air mattress Turn and reposition every 2 hours Additional Orders / Instructions: Wound #1 Sacrum: Increase protein intake. Activity as tolerated Other: - Please add vitamin A, vitamin C, multivitamin and zinc supplements to your diet - available over the [place]: [place], [place] ([id number]) Wound #1 Sacrum: [place] [facility] - [facility] to go change dressing Tuesday, Thursday and Saturday every other week ***Starting next week*** on the opposite week the [facility] to go change the dressing on Tuesdays and Saturday and the pt will be seen at the [place] on Thursdays Home Health Nurse may visit  PRN to address patient s wound care needs. FACE TO FACE ENCOUNTER: MEDICARE and MEDICAID PATIENTS: I certify that this patient is under my care and that I had a face-to-face encounter that meets the physician face-to-face encounter requirements with this patient on this date. The encounter with the patient was in whole or in part for the following MEDICAL CONDITION: (primary reason for [place]) MEDICAL NECESSITY: I certify, that based on my findings, NURSING services are a medically necessary home health service. HOME BOUND STATUS: I certify that my clinical findings support that this patient is homebound (i.e., Due to illness or injury, pt requires aid of supportive devices such as crutches, cane, wheelchairs, walkers, the use of special transportation or the assistance of another person to leave their place of residence. There is a normal inability to leave the home and doing so requires considerable and taxing effort. Other absences are for medical reasons / religious services and are infrequent or of short duration when for other reasons). If current dressing causes regression in wound condition, may D/C ordered dressing product/s  and apply Normal Saline Moist Dressing daily until next [place] / Other MD appointment. [place] of regression in wound condition at [phone]. Please direct any NON-WOUND related issues/requests for orders to patient's Primary Care Physician Negative Pressure Wound Therapy: Wound #1 Sacrum: Wound VAC settings at 125/130 mmHg continuous pressure. Use BLACK/GREEN foam to wound cavity. Use WHITE foam to fill any tunnel/s and/or undermining. Change VAC dressing 3 X WEEK. Change canister as indicated when full. Nurse may titrate settings and frequency of dressing changes as clinically indicated. - HHRN to go change dressing Tuesday, Thursday and Saturday every other week ***Starting next week*** on the opposite week the [facility] to  go change the dressing on Tuesdays and Saturday and the pt will be seen at the [place] on Thursdays Home Health Nurse may d/c VAC for s/s of increased infection, significant wound regression, or uncontrolled drainage. [place] at [phone]. Apply contact layer over base of wound. Number of foam/gauze pieces used in the dressing = - 2 Other: - Prisma Ag under the black foam to the wound bed and pack into the undermining The following medication(s) was prescribed: lidocaine topical 4 % cream 1 1 cream topical was prescribed at facility 1. Continue with Prisma and negative pressure wound therapy system (Thursday, Saturday, Tuesday) 2. Follow-up in 2 weeks Electronic Signature(s) Signed: 01/29/2018 10:14:50 AM By: Lawanda Cousins Previous Signature: 01/22/2018 3:31:51 PM Version By: Lawanda Cousins Entered By: Lawanda Cousins on 01/29/2018 10:14:49 Hokenson, [name] ([id number]) -------------------------------------------------------------------------------- ROS/PFSH Details Patient Name: [name], [name] Date of Service: 01/22/2018 2:00 PM Medical Record Number: [id number] Patient Account Number: [id number] Date of Birth/Sex: November 09, 1930 (84 y.o. Female) Treating RN: Carolyne Fiscal, Debi Primary Care Provider: Priscille Kluver Other Clinician: Referring Provider: Priscille Kluver Treating Provider/Extender: Cathie Olden in Treatment: 15 Information Obtained From Patient Wound History Do you currently have one or more open woundso Yes How many open wounds do you currently haveo 1 Approximately how long have you had your woundso 3 months How have you been treating your wound(s) until nowo santyl Has your wound(s) ever healed and then re-openedo No Have you had any lab work done in the past montho Yes Who ordered the lab work [facility] hospital Have you tested positive for an antibiotic resistant organism (MRSA, VRE)o No Have you tested positive for osteomyelitis  (bone infection)o Yes Have you had any tests for circulation on your legso No Hematologic/Lymphatic Medical History: Positive for: Anemia Negative for: Hemophilia; Human Immunodeficiency Virus; Lymphedema; Sickle Cell Disease Respiratory Medical History: Negative for: Aspiration; Asthma; Chronic Obstructive Pulmonary Disease (COPD); Pneumothorax; Sleep Apnea; Tuberculosis Cardiovascular Medical History: Positive for: Congestive Heart Failure; Hypertension Negative for: Angina; Arrhythmia; Coronary Artery Disease; Deep Vein Thrombosis; Hypotension; Myocardial Infarction; Peripheral Arterial Disease; Peripheral Venous Disease Gastrointestinal Medical History: Negative for: Cirrhosis ; Colitis; Crohnos; Hepatitis A; Hepatitis B Past Medical History Notes: colostomy since 1990 Immunological Medical History: Negative for: Lupus Erythematosus; Raynaudos; Scleroderma Musculoskeletal Medical History: Positive for: Osteoarthritis; Osteomyelitis [name], [name] ([id number]) Negative for: Gout; Rheumatoid Arthritis Neurologic Medical History: Negative for: Dementia; Neuropathy; Quadriplegia Oncologic Medical History: Positive for: Received Chemotherapy; Received Radiation Past Medical History Notes: colorectal - 1990 Immunizations Pneumococcal Vaccine: Received Pneumococcal Vaccination: Yes Immunization Notes: up to date Implantable Devices Family and Social History Cancer: Yes - Child; Diabetes: No; Heart Disease: No; Hereditary Spherocytosis: No; Hypertension: No; Kidney Disease: No; Lung Disease: No; Seizures: No; Stroke: No; Thyroid Problems: No; Tuberculosis: No; Never smoker; Marital  Status - Married; Alcohol Use: Never; Drug Use: No History; Caffeine Use: Daily; Financial Concerns: No; Food, Clothing or Shelter Needs: No; Support System Lacking: No; Transportation Concerns: No; Advanced Directives: Yes (Not Provided); Patient does not want information on Advanced  Directives; Living Will: Yes (Not Provided); Medical Power of Attorney: Yes - son - Darthula Desa (Not Provided) Physician Affirmation I have reviewed and agree with the above information. Electronic Signature(s) Signed: 01/22/2018 5:45:09 PM By: Lawanda Cousins Signed: 01/23/2018 8:00:35 AM By: Alric Quan Entered By: Lawanda Cousins on 01/22/2018 15:31:03 Rhodell, Wheeler L. ([id number]) -------------------------------------------------------------------------------- SuperBill Details Patient Name: [name], Solomiya L. Date of Service: 01/22/2018 Medical Record Number: [id number] Patient Account Number: [id number] Date of Birth/Sex: [birth year] (84 y.o. Female) Treating RN: Carolyne Fiscal, Debi Primary Care Provider: Priscille Kluver Other Clinician: Referring Provider: Priscille Kluver Treating Provider/Extender: Cathie Olden in Treatment: 15 Diagnosis Coding ICD-10 Codes Code Description L89.154 Pressure ulcer of sacral region, stage 4 E44.0 Moderate protein-calorie malnutrition Z99.3 Dependence on wheelchair Facility Procedures CPT4 Code: 54650354 Description: [place] TISSUE 20 SQ CM/< ICD-10 Diagnosis Description L89.154 Pressure ulcer of sacral region, stage 4 E44.0 Moderate protein-calorie malnutrition Modifier: Quantity: 1 Physician Procedures CPT4 Code: 6568127 Description: 51700 - WC PHYS SUBQ TISS 20 SQ CM ICD-10 Diagnosis Description L89.154 Pressure ulcer of sacral region, stage 4 E44.0 Moderate protein-calorie malnutrition Modifier: Quantity: 1 Electronic Signature(s) Signed: 01/22/2018 3:32:02 PM By: Lawanda Cousins Entered By: Lawanda Cousins on 01/22/2018 15:32:02

## 2018-02-05 ENCOUNTER — Encounter: Payer: Medicare Other | Attending: Nurse Practitioner | Admitting: Nurse Practitioner

## 2018-02-05 DIAGNOSIS — Z85048 Personal history of other malignant neoplasm of rectum, rectosigmoid junction, and anus: Secondary | ICD-10-CM | POA: Diagnosis not present

## 2018-02-05 DIAGNOSIS — L89154 Pressure ulcer of sacral region, stage 4: Secondary | ICD-10-CM | POA: Diagnosis not present

## 2018-02-05 DIAGNOSIS — Z993 Dependence on wheelchair: Secondary | ICD-10-CM | POA: Diagnosis not present

## 2018-02-05 DIAGNOSIS — E440 Moderate protein-calorie malnutrition: Secondary | ICD-10-CM | POA: Insufficient documentation

## 2018-02-05 DIAGNOSIS — Z8614 Personal history of Methicillin resistant Staphylococcus aureus infection: Secondary | ICD-10-CM | POA: Diagnosis not present

## 2018-02-05 DIAGNOSIS — I10 Essential (primary) hypertension: Secondary | ICD-10-CM | POA: Insufficient documentation

## 2018-02-06 NOTE — Progress Notes (Signed)
[name] ([id number]) Visit Report for 02/05/2018 Arrival Information Details Patient Name: [name], [name] Date of Service: 02/05/2018 1:00 PM Medical Record Number: [id number] Patient Account Number: [id number] Date of Birth/Sex: 1930-06-04 (84 y.o. Female) Treating RN: Roger Shelter Primary Care Vesna Kable: Priscille Kluver Other Clinician: Referring Renetta Suman: Priscille Kluver Treating Aevah Stansbery/Extender: Cathie Olden in Treatment: 68 Visit Information History Since Last Visit All ordered tests and consults were completed: No Patient Arrived: Wheel Chair Added or deleted any medications: No Arrival Time: 12:58 Any new allergies or adverse reactions: No Accompanied By: daughter Had a fall or experienced change in No Transfer Assistance: EasyPivot Patient activities of daily living that may affect Lift risk of falls: Patient Identification Verified: Yes Signs or symptoms of abuse/neglect since last visito No Secondary Verification Process Yes Hospitalized since last visit: No Completed: Pain Present Now: No Patient Requires Transmission-Based No Precautions: Patient Has Alerts: No Electronic Signature(s) Signed: 02/05/2018 4:51:26 PM By: Roger Shelter Entered By: Roger Shelter on 02/05/2018 12:59:23 Simerly, [name] ([id number]) -------------------------------------------------------------------------------- Encounter Discharge Information Details Patient Name: [name], [name] Date of Service: 02/05/2018 1:00 PM Medical Record Number: [id number] Patient Account Number: [id number] Date of Birth/Sex: 1930/08/31 (84 y.o. Female) Treating RN: Roger Shelter Primary Care Chapman Matteucci: Priscille Kluver Other Clinician: Referring Leron Stoffers: Priscille Kluver Treating Leah Thornberry/Extender: Cathie Olden in Treatment: 17 Encounter Discharge Information Items Discharge Pain Level: 0 Discharge Condition: Stable Ambulatory Status: Wheelchair Discharge Destination:  Home Private Transportation: Auto Accompanied By: caregiver Schedule Follow-up Appointment: Yes Medication Reconciliation completed and provided No to Patient/Care Kate Sweetman: Clinical Summary of Care: Electronic Signature(s) Signed: 02/05/2018 4:51:26 PM By: Roger Shelter Entered By: Roger Shelter on 02/05/2018 13:43:13 Torti, [name] ([id number]) -------------------------------------------------------------------------------- Lower Extremity Assessment Details Patient Name: [name], [name] Date of Service: 02/05/2018 1:00 PM Medical Record Number: [id number] Patient Account Number: [id number] Date of Birth/Sex: March 08, 1930 (84 y.o. Female) Treating RN: Roger Shelter Primary Care Atwood Adcock: Priscille Kluver Other Clinician: Referring Catalyna Reilly: Priscille Kluver Treating Kenniyah Sasaki/Extender: Cathie Olden in Treatment: 17 Electronic Signature(s) Signed: 02/05/2018 4:51:26 PM By: Roger Shelter Entered By: Roger Shelter on 02/05/2018 13:10:49 [name] ([id number]) -------------------------------------------------------------------------------- Multi Wound Chart Details Patient Name: [name], [name] Date of Service: 02/05/2018 1:00 PM Medical Record Number: [id number] Patient Account Number: [id number] Date of Birth/Sex: 01-Oct-1930 (84 y.o. Female) Treating RN: Carolyne Fiscal, Debi Primary Care Dorinne Graeff: Priscille Kluver Other Clinician: Referring Carisma Troupe: Priscille Kluver Treating Caitlen Worth/Extender: Cathie Olden in Treatment: 17 Vital Signs Height(in): 64 Pulse(bpm): 89 Weight(lbs): 200 Blood Pressure(mmHg): 212/71 Body Mass Index(BMI): 34 Temperature(F): 97.6 Respiratory Rate 16 (breaths/min): Photos: [1:No Photos] [N/A:N/A] Wound Location: [1:Sacrum] [N/A:N/A] Wounding Event: [1:Pressure Injury] [N/A:N/A] Primary Etiology: [1:Pressure Ulcer] [N/A:N/A] Comorbid History: [1:Anemia, Congestive Heart Failure, Hypertension, Osteoarthritis, Osteomyelitis,  Received Chemotherapy, Received Radiation] [N/A:N/A] Date Acquired: [1:07/14/2017] [N/A:N/A] Weeks of Treatment: [1:17] [N/A:N/A] Wound Status: [1:Open] [N/A:N/A] Measurements L x W x D [1:2x0.5x0.9] [N/A:N/A] (cm) Area (cm) : [1:0.785] [N/A:N/A] Volume (cm) : [1:0.707] [N/A:N/A] % Reduction in Area: [1:30.60%] [N/A:N/A] % Reduction in Volume: [1:51.90%] [N/A:N/A] Starting Position 1 [1:12] (o'clock): Ending Position 1 [1:12] (o'clock): Maximum Distance 1 (cm): [1:2.8] Undermining: [1:Yes] [N/A:N/A] Classification: [1:Category/Stage IV] [N/A:N/A] Exudate Amount: [1:Large] [N/A:N/A] Exudate Type: [1:Serous] [N/A:N/A] Exudate Color: [1:amber] [N/A:N/A] Wound Margin: [1:Flat and Intact] [N/A:N/A] Granulation Amount: [1:Large (67-100%)] [N/A:N/A] Granulation Quality: [1:Red] [N/A:N/A] Necrotic Amount: [1:Small (1-33%)] [N/A:N/A] Necrotic Tissue: [1:Eschar, Adherent Slough] [N/A:N/A] Exposed Structures: [1:Fascia: Yes Fat Layer (Subcutaneous Tissue) Exposed: Yes Tendon: Yes] [N/A:N/A] Bone: Yes Muscle: No Joint: No Epithelialization: None N/A N/A  Debridement: Debridement ([id number]) N/A N/A Pre-procedure 13:34 N/A N/A Verification/Time Out Taken: Pain Control: Lidocaine 4% Topical Solution N/A N/A Tissue Debrided: Fibrin/Slough, Exudates, N/A N/A Subcutaneous Level: Skin/Subcutaneous Tissue N/A N/A Debridement Area (sq cm): 1 N/A N/A Instrument: Curette N/A N/A Bleeding: Minimum N/A N/A Hemostasis Achieved: Pressure N/A N/A Procedural Pain: 0 N/A N/A Post Procedural Pain: 0 N/A N/A Debridement Treatment Procedure was tolerated well N/A N/A Response: Post Debridement 2x0.5x1 N/A N/A Measurements L x W x D (cm) Post Debridement Volume: 0.785 N/A N/A (cm) Post Debridement Stage: Category/Stage IV N/A N/A Periwound Skin Texture: Scarring: Yes N/A N/A Excoriation: No Induration: No Callus: No Crepitus: No Rash: No Periwound Skin Moisture: Maceration: Yes N/A  N/A Dry/Scaly: No Periwound Skin Color: Atrophie Blanche: No N/A N/A Cyanosis: No Ecchymosis: No Erythema: No Hemosiderin Staining: No Mottled: No Pallor: No Rubor: No Temperature: No Abnormality N/A N/A Tenderness on Palpation: Yes N/A N/A Wound Preparation: Ulcer Cleansing: N/A N/A Rinsed/Irrigated with Saline Topical Anesthetic Applied: Other: lidocaine 4% Procedures Performed: Debridement N/A N/A Treatment Notes Wound #1 (Sacrum) 1. Cleansed with: Clean wound with Normal Saline 2. Anesthetic Topical Lidocaine 4% cream to wound bed prior to debridement 4. Dressing Applied: Coates, Jaskiran L. ([id number]) Hydrafera Blue 5. Secondary Dressing Applied Bordered Foam Dressing Electronic Signature(s) Signed: 02/05/2018 1:52:58 PM By: Lawanda Cousins Entered By: Lawanda Cousins on 02/05/2018 13:52:57 Reinitz, Huguley ([id number]) -------------------------------------------------------------------------------- [place] Details Patient Name: [name], [name] Date of Service: 02/05/2018 1:00 PM Medical Record Number: [id number] Patient Account Number: [id number] Date of Birth/Sex: 05/28/1930 (84 y.o. Female) Treating RN: Carolyne Fiscal, Debi Primary Care Mahrukh Seguin: Priscille Kluver Other Clinician: Referring Larkyn Greenberger: Priscille Kluver Treating Jaran Sainz/Extender: Cathie Olden in Treatment: 76 Active Inactive ` Abuse / Safety / Falls / Self Care Management Nursing Diagnoses: Potential for falls Goals: Patient will not experience any injury related to falls Date Initiated: 10/09/2017 Target Resolution Date: 01/10/2018 Goal Status: Active Interventions: Assess fall risk on admission and as needed Notes: ` Orientation to the Wound Care Program Nursing Diagnoses: Knowledge deficit related to the wound healing center program Goals: Patient/caregiver will verbalize understanding of the [place] Program Date Initiated: 10/09/2017 Target Resolution Date:  01/10/2018 Goal Status: Active Interventions: Provide education on orientation to the wound center Notes: ` Pressure Nursing Diagnoses: Potential for impaired tissue integrity related to pressure, friction, moisture, and shear Goals: Patient will remain free from development of additional pressure ulcers Date Initiated: 10/09/2017 Target Resolution Date: 01/10/2018 Goal Status: Active Interventions: Breana, Litts Aaralyn L. ([id number]) Assess offloading mechanisms upon admission and as needed Provide education on pressure ulcers Notes: ` Wound/Skin Impairment Nursing Diagnoses: Impaired tissue integrity Goals: Ulcer/skin breakdown will heal within 14 weeks Date Initiated: 10/09/2017 Target Resolution Date: 01/10/2018 Goal Status: Active Interventions: Assess patient/caregiver ability to obtain necessary supplies Assess patient/caregiver ability to perform ulcer/skin care regimen upon admission and as needed Assess ulceration(s) every visit Notes: Electronic Signature(s) Signed: 02/05/2018 4:33:49 PM By: Alric Quan Entered By: Alric Quan on 02/05/2018 13:29:36 Stills, Sherryann L. ([id number]) -------------------------------------------------------------------------------- Pain Assessment Details Patient Name: [name], [name] Date of Service: 02/05/2018 1:00 PM Medical Record Number: [id number] Patient Account Number: [id number] Date of Birth/Sex: [birth year] (84 y.o. Female) Treating RN: Roger Shelter Primary Care Kelsey Edman: Priscille Kluver Other Clinician: Referring Zenna Traister: Priscille Kluver Treating Michoel Kunin/Extender: Cathie Olden in Treatment: 17 Active Problems Location of Pain Severity and Description of Pain Patient Has Paino No Site Locations Pain Management and Medication Current Pain Management: Electronic Signature(s) Signed: 02/05/2018 4:51:26  PM By: Roger Shelter Entered By: Roger Shelter on 02/05/2018 [place], [place] LMarland Kitchen  ([id number]) -------------------------------------------------------------------------------- Patient/Caregiver Education Details Patient Name: [name], [name] Date of Service: 02/05/2018 1:00 PM Medical Record Number: [id number] Patient Account Number: [id number] Date of Birth/Gender: [birth year] (84 y.o. Female) Treating RN: Roger Shelter Primary Care Physician: Priscille Kluver Other Clinician: Referring Physician: Priscille Kluver Treating Physician/Extender: Cathie Olden in Treatment: 17 Education Assessment Education Provided To: Patient [facility] orderes sent Education Topics Provided Wound Debridement: Handouts: Wound Debridement Methods: Explain/Verbal Responses: State content correctly Wound/Skin Impairment: Methods: Explain/Verbal Responses: State content correctly Electronic Signature(s) Signed: 02/05/2018 4:51:26 PM By: Roger Shelter Entered By: Roger Shelter on 02/05/2018 13:43:39 [name], [name] ([id number]) -------------------------------------------------------------------------------- Wound Assessment Details Patient Name: [name], [name] Date of Service: 02/05/2018 1:00 PM Medical Record Number: [id number] Patient Account Number: [id number] Date of Birth/Sex: 10-27-1930 (84 y.o. Female) Treating RN: Roger Shelter Primary Care Romello Hoehn: Priscille Kluver Other Clinician: Referring Theda Payer: Priscille Kluver Treating Dajanique Robley/Extender: Cathie Olden in Treatment: 17 Wound Status Wound Number: 1 Primary Pressure Ulcer Etiology: Wound Location: Sacrum Wound Open Wounding Event: Pressure Injury Status: Date Acquired: 07/14/2017 Comorbid Anemia, Congestive Heart Failure, Weeks Of Treatment: 17 History: Hypertension, Osteoarthritis, Osteomyelitis, Clustered Wound: No Received Chemotherapy, Received Radiation Photos Photo Uploaded By: Roger Shelter on 02/05/2018 14:27:28 Wound Measurements Length: (cm) 2 Width: (cm) 0.5 Depth: (cm) 0.9 Area: (cm)  0.785 Volume: (cm) 0.707 % Reduction in Area: 30.6% % Reduction in Volume: 51.9% Epithelialization: None Undermining: Yes Starting Position (o'clock): 12 Ending Position (o'clock): 12 Maximum Distance: (cm) 2.8 Wound Description Classification: Category/Stage IV Wound Margin: Flat and Intact Exudate Amount: Large Exudate Type: Serous Exudate Color: amber Foul Odor After Cleansing: No Slough/Fibrino Yes Wound Bed Granulation Amount: Large (67-100%) Exposed Structure Granulation Quality: Red Fascia Exposed: Yes Necrotic Amount: Small (1-33%) Fat Layer (Subcutaneous Tissue) Exposed: Yes Necrotic Quality: Eschar, Adherent Slough Tendon Exposed: Yes Muscle Exposed: No Joint Exposed: No [name], [name] ([id number]) Bone Exposed: Yes Periwound Skin Texture Texture Color No Abnormalities Noted: No No Abnormalities Noted: No Callus: No Atrophie Blanche: No Crepitus: No Cyanosis: No Excoriation: No Ecchymosis: No Induration: No Erythema: No Rash: No Hemosiderin Staining: No Scarring: Yes Mottled: No Pallor: No Moisture Rubor: No No Abnormalities Noted: No Dry / Scaly: No Temperature / Pain Maceration: Yes Temperature: No Abnormality Tenderness on Palpation: Yes Wound Preparation Ulcer Cleansing: Rinsed/Irrigated with Saline Topical Anesthetic Applied: Other: lidocaine 4%, Treatment Notes Wound #1 (Sacrum) 1. Cleansed with: Clean wound with Normal Saline 2. Anesthetic Topical Lidocaine 4% cream to wound bed prior to debridement 4. Dressing Applied: Hydrafera Blue 5. Secondary Dressing Applied Bordered Foam Dressing Electronic Signature(s) Signed: 02/05/2018 4:51:26 PM By: Roger Shelter Entered By: Roger Shelter on 02/05/2018 13:10:38 [place], [place] ([id number]) -------------------------------------------------------------------------------- Vitals Details Patient Name: [name], [name] Date of Service: 02/05/2018 1:00 PM Medical Record  Number: [id number] Patient Account Number: [id number] Date of Birth/Sex: 06/07/1930 (84 y.o. Female) Treating RN: Roger Shelter Primary Care Othella Slappey: Priscille Kluver Other Clinician: Referring Lakyra Tippins: Priscille Kluver Treating Cecilie Heidel/Extender: Cathie Olden in Treatment: 17 Vital Signs Time Taken: 13:00 Temperature (F): 97.6 Height (in): 64 Pulse (bpm): 66 Weight (lbs): 200 Respiratory Rate (breaths/min): 16 Body Mass Index (BMI): 34.3 Blood Pressure (mmHg): 212/71 Reference Range: 80 - 120 mg / dl Electronic Signature(s) Signed: 02/05/2018 4:51:26 PM By: Roger Shelter Entered By: Roger Shelter on 02/05/2018 13:02:00

## 2018-02-10 NOTE — Progress Notes (Signed)
[name], [name] ([id number]) Visit Report for 02/05/2018 Chief Complaint Document Details Patient Name: [name], [name] Date of Service: 02/05/2018 1:00 PM Medical Record Number: [id number] Patient Account Number: [id number] Date of Birth/Sex: [birth year] (84 y.o. Female) Treating RN: Ahmed Prima Primary Care Provider: Priscille Kluver Other Clinician: Referring Provider: Priscille Kluver Treating Provider/Extender: Cathie Olden in Treatment: 17 Information Obtained from: Patient Chief Complaint she is here in follow-up evaluation of a sacral ulcer Electronic Signature(s) Signed: 02/05/2018 1:53:44 PM By: Lawanda Cousins Entered By: Lawanda Cousins on 02/05/2018 13:53:44 [place], [place] ([id number]) -------------------------------------------------------------------------------- Debridement Details Patient Name: [name], [name] Date of Service: 02/05/2018 1:00 PM Medical Record Number: [id number] Patient Account Number: [id number] Date of Birth/Sex: 11/15/1930 (84 y.o. Female) Treating RN: Carolyne Fiscal, Debi Primary Care Provider: Priscille Kluver Other Clinician: Referring Provider: Priscille Kluver Treating Provider/Extender: Cathie Olden in Treatment: 17 Debridement Performed for Wound #1 Sacrum Assessment: Performed By: Physician Lawanda Cousins, NP Debridement: Debridement Pre-procedure Verification/Time Yes - 13:34 Out Taken: Start Time: 13:35 Pain Control: Lidocaine 4% Topical Solution Level: Skin/Subcutaneous Tissue Total Area Debrided (L x W): 2 (cm) x 0.5 (cm) = 1 (cm) Tissue and other material Viable, Non-Viable, Exudate, Fibrin/Slough, Subcutaneous debrided: Instrument: Curette Bleeding: Minimum Hemostasis Achieved: Pressure End Time: 13:37 Procedural Pain: 0 Post Procedural Pain: 0 Response to Treatment: Procedure was tolerated well Post Debridement Measurements of Total Wound Length: (cm) 2 Stage: Category/Stage IV Width: (cm) 0.5 Depth: (cm) 1 Volume:  (cm) 0.785 Character of Wound/Ulcer Post Requires Further Debridement Debridement: Post Procedure Diagnosis Same as Pre-procedure Electronic Signature(s) Signed: 02/05/2018 4:33:49 PM By: Alric Quan Signed: 02/05/2018 5:22:04 PM By: Lawanda Cousins Entered By: Alric Quan on 02/05/2018 13:37:04 [place], [place] ([id number]) -------------------------------------------------------------------------------- HPI Details Patient Name: [name], [name] Date of Service: 02/05/2018 1:00 PM Medical Record Number: [id number] Patient Account Number: [id number] Date of Birth/Sex: [birth year] (84 y.o. Female) Treating RN: Carolyne Fiscal, Debi Primary Care Provider: Priscille Kluver Other Clinician: Referring Provider: Priscille Kluver Treating Provider/Extender: Cathie Olden in Treatment: 47 History of Present Illness HPI Description: 84 year old patient who has been referred to [place] for the sacral decubitus ulcer was admitted to the [facility] on 07/18/2017 for an acute sacral decubitus with osteomyelitis. She was treated there with IV antibiotics and surgical debridement was done on 08/06/2017 and transferred back to a swing bed 08/12/2017. She has completed antibiotics on 09/18/2017 and has been now living at home. Past medical history of GERD, fracture of right humerus, hypertension, rectal cancer, status post colostomy, laparoscopic cholecystectomy, debridement of sacral decubitus ulcer including subcutaneous tissue muscle and bone done on 08/06/2017, previous rectal cancer surgery in April 2017. Reviewing notes from the [facility] it was noted that she was treated for a MRSA sacral osteomyelitis and Proteus bacteremia requiring IV antibiotics which included clindamycin and vancomycin. Vancomycin had to be stopped on September 25 due to neutropenia and thrombocytopenia and changed to daptomycin until 09/20/2017. Infectious disease then advised to switch to clindamycin and the PICC  line was removed on 09/22/2017. review of the electronic medical records noted that cultures of bone and tissue taken from the OR for wound debridement on 95 grew MRSA and she was started on IV antibiotics initially clindamycin and switched to IV vancomycin and the Proteus bacteremia was stained treated with by mouth levofloxacin and Flagyl for 6 weeks to end on 09/03/2017. She was also advised an offloading mattress and would follow-up with supportive care 10/17/2017 -- the patient is ambulating a bit with the help  of OT and PT and she is also eating much better. She has got inappropriate air mattress in place. 11/06/2017 -- the patient is a bit more ambivalent and continues to work with her mobilization. She is not yet ready for a wound VAC. 11/20/17 she is here in follow-up evaluation of a sacral ulcer. Wound VAC has not been improved, will continue with Santyl and anticipate that she can transfer to wound VAC at next appointment, bone fragments present in wound, no indication of infection. 12/19/17 on evaluation today patient sacral wound appears to be doing decently well. She still has some bone exposure although this is minimal compared to previous in fact it just feels rough along the region of the bone or at least where this was this appears to have covered over with granulation for the most part. Nonetheless the wound measurements otherwise have not really significantly changed. We are going to be initiating the Wound VAC today. No fevers, chills, nausea, or vomiting noted at this time. 12/25/17-she is here in follow-up evaluation of the sacral stage IV pressure ulcer. She is tolerating negative pressure wound therapy. She does admit to prolonged sitting which we have discussed at length modified ways to offload and exercise. We will continue with negative pressure wound therapy and follow-up next week 01/08/18-she is here in follow-up evaluation for sacral stage IV pressure ulcer. She did  not make last week's appointment secondary to being hospitalized for electrolyte imbalance: she states the negative pressure wound therapy was maintained throughout hospitalization. She is unable to adequately offload per her admission which is primarily contributing to poor healing. She does have pressure relief chair cushions that she admits to using. We'll add Prisma, in conjunction with negative pressure. She is requesting a 2 week follow-up. 01/22/18-she is here in follow-up evaluation for sacral stage IV pressure ulcer. Essentially unchanged in measurements with persistent circumferential undermining. She continues to have limited mobility and suboptimal pressure relief. We will continue with Prisma and negative pressure wound therapy. If there continues to be narrowing of the external opening with no change in the circumferential undermining we may need to abort the negative pressure wound therapy 02/05/18-she is here in follow-up evaluation for a sacral stage IV pressure ulcer. There continues to be essentially no change in measurements, we will discontinue negative pressure wound therapy, as there has been no improvement in 2 months. We will transition to [facility] Blue 3 times weekly. Home health continues to come out on a Monday Wednesday Friday schedule despite our orders for Tuesday Thursday Saturday. We have not been notified from the company regarding any inability to go on the days as ordered. We will again recheck the home health agency and if they are unable to provide care Mckeone, Raeden L. ([id number]) on a T/Th/Sat schedule the patient will have to reschedule her days here at the clinic to be more appropriately treated; I discussed the situation with her and she is understanding. She continues to have limited mobility and suboptimal pressure relief. Electronic Signature(s) Signed: 02/05/2018 1:57:39 PM By: Lawanda Cousins Previous Signature: 02/05/2018 1:56:19 PM Version By: Lawanda Cousins Entered By: Lawanda Cousins on 02/05/2018 13:57:39 [place], [place] ([id number]) -------------------------------------------------------------------------------- Physician Orders Details Patient Name: [name], Tomorrow L. Date of Service: 02/05/2018 1:00 PM Medical Record Number: [id number] Patient Account Number: [id number] Date of Birth/Sex: 02-29-1930 (84 y.o. Female) Treating RN: Carolyne Fiscal, Debi Primary Care Provider: Priscille Kluver Other Clinician: Referring Provider: Priscille Kluver Treating Provider/Extender: Cathie Olden in Treatment: 17 Verbal /  Phone Orders: Yes Clinician: [place], Debi Read Back and Verified: Yes Diagnosis Coding Wound Cleansing Wound #1 Sacrum o Clean wound with Normal Saline. Anesthetic (add to Medication List) Wound #1 Sacrum o Topical Lidocaine 4% cream applied to wound bed prior to debridement (In Clinic Only). Skin Barriers/Peri-Wound Care Wound #1 Sacrum o Skin Prep Primary Wound Dressing Wound #1 Sacrum o Hydrafera Blue - (please pack lightly into the undermining as well) Secondary Dressing o Boardered Foam Dressing Dressing Change Frequency Wound #1 Sacrum o Change Dressing Tuesday and Thursday. - HHRN to change on Tuesday, every other Thursday and pt will come to [place] every other Thursday. Follow-up Appointments Wound #1 Sacrum o Return Appointment in 2 weeks. Off-Loading Wound #1 Sacrum o Mattress - Continue air mattress o Turn and reposition every 2 hours Additional Orders / Instructions Wound #1 Sacrum o Increase protein intake. o Activity as tolerated o Other: - Please add vitamin A, vitamin C, multivitamin and zinc supplements to your diet - available over the [place] #1 Antionette Luster, [place] ([id number]) o [place] Visits - HHRN to change on Tuesday, every other Thursday and pt will come to [place] every other Thursday. o Home Health  Nurse may visit PRN to address patientos wound care needs. o FACE TO FACE ENCOUNTER: MEDICARE and MEDICAID PATIENTS: I certify that this patient is under my care and that I had a face-to-face encounter that meets the physician face-to-face encounter requirements with this patient on this date. The encounter with the patient was in whole or in part for the following MEDICAL CONDITION: (primary reason for [place]) MEDICAL NECESSITY: I certify, that based on my findings, NURSING services are a medically necessary home health service. HOME BOUND STATUS: I certify that my clinical findings support that this patient is homebound (i.e., Due to illness or injury, pt requires aid of supportive devices such as crutches, cane, wheelchairs, walkers, the use of special transportation or the assistance of another person to leave their place of residence. There is a normal inability to leave the home and doing so requires considerable and taxing effort. Other absences are for medical reasons / religious services and are infrequent or of short duration when for other reasons). o If current dressing causes regression in wound condition, may D/C ordered dressing product/s and apply Normal Saline Moist Dressing daily until next [place] / Other MD appointment. [place] of regression in wound condition at [phone]. o Please direct any NON-WOUND related issues/requests for orders to patient's Primary Care Physician Negative Pressure Wound Therapy Wound #1 Sacrum o Discontinue NPWT. Patient Medications Allergies: No Known Allergies Notifications Medication Indication Start End lidocaine DOSE 1 - topical 4 % cream - 1 cream topical Electronic Signature(s) Signed: 02/05/2018 5:22:04 PM By: Lawanda Cousins Signed: 02/06/2018 4:41:33 PM By: Alric Quan Previous Signature: 02/05/2018 4:33:49 PM Version By: Alric Quan Entered By: Alric Quan on 02/05/2018  16:36:54 [place], [place] ([id number]) -------------------------------------------------------------------------------- Prescription 02/05/2018 Patient Name: [name], Cambryn L. Provider: Lawanda Cousins NP Date of Birth: 07/11/1930 NPI#: [id number] Sex: F DEA#: [id number] Phone #: [phone] License #: Patient Address: [place] [address] [place], [place] [address] [place], [place] [address] [phone] Allergies No Known Allergies Medication Medication: Route: Strength: Form: lidocaine 4 % topical cream topical 4% cream Class: TOPICAL LOCAL ANESTHETICS Dose: Frequency / Time: Indication: 1 1  cream topical Number of Refills: Number of Units: 0 Generic Substitution: Start Date: End Date: One Time Use: Substitution Permitted No Note to Pharmacy: Signature(s): Date(s): Electronic Signature(s) Signed: 02/05/2018 5:22:04 PM By: Lawanda Cousins Signed: 02/06/2018 4:41:33 PM By: Alric Quan Previous Signature: 02/05/2018 4:33:49 PM Version By: Alric Quan Entered By: Alric Quan on 02/05/2018 16:36:55 [place], [place] ([id number]) --------------------------------------------------------------------------------  Problem List Details Patient Name: Rakes, Litsy L. Date of Service: 02/05/2018 1:00 PM Medical Record Number: [id number] Patient Account Number: [id number] Date of Birth/Sex: [birth year] (84 y.o. Female) Treating RN: Ahmed Prima Primary Care Provider: Priscille Kluver Other Clinician: Referring Provider: Priscille Kluver Treating Provider/Extender: Cathie Olden in Treatment: 17 Active Problems ICD-10 Encounter Code Description Active Date Diagnosis L89.154 Pressure ulcer of sacral region, stage 4 10/09/2017 Yes E44.0 Moderate protein-calorie malnutrition 10/09/2017 Yes Z99.3 Dependence on wheelchair 10/09/2017 Yes Inactive  Problems Resolved Problems Electronic Signature(s) Signed: 02/05/2018 1:52:51 PM By: Lawanda Cousins Entered By: Lawanda Cousins on 02/05/2018 13:52:51 [place], [place] ([id number]) -------------------------------------------------------------------------------- Progress Note Details Patient Name: Oddo, Samamtha L. Date of Service: 02/05/2018 1:00 PM Medical Record Number: [id number] Patient Account Number: [id number] Date of Birth/Sex: 25-Feb-1930 (84 y.o. Female) Treating RN: Carolyne Fiscal, Debi Primary Care Provider: Priscille Kluver Other Clinician: Referring Provider: Priscille Kluver Treating Provider/Extender: Cathie Olden in Treatment: 17 Subjective Chief Complaint Information obtained from Patient she is here in follow-up evaluation of a sacral ulcer History of Present Illness (HPI) 84 year old patient who has been referred to [place] for the sacral decubitus ulcer was admitted to the [facility] on 07/18/2017 for an acute sacral decubitus with osteomyelitis. She was treated there with IV antibiotics and surgical debridement was done on 08/06/2017 and transferred back to a swing bed 08/12/2017. She has completed antibiotics on 09/18/2017 and has been now living at home. Past medical history of GERD, fracture of right humerus, hypertension, rectal cancer, status post colostomy, laparoscopic cholecystectomy, debridement of sacral decubitus ulcer including subcutaneous tissue muscle and bone done on 08/06/2017, previous rectal cancer surgery in April 2017. Reviewing notes from the [facility] it was noted that she was treated for a MRSA sacral osteomyelitis and Proteus bacteremia requiring IV antibiotics which included clindamycin and vancomycin. Vancomycin had to be stopped on September 25 due to neutropenia and thrombocytopenia and changed to daptomycin until 09/20/2017. Infectious disease then advised to switch to clindamycin and the PICC line was removed on 09/22/2017. review of the  electronic medical records noted that cultures of bone and tissue taken from the OR for wound debridement on 95 grew MRSA and she was started on IV antibiotics initially clindamycin and switched to IV vancomycin and the Proteus bacteremia was stained treated with by mouth levofloxacin and Flagyl for 6 weeks to end on 09/03/2017. She was also advised an offloading mattress and would follow-up with supportive care 10/17/2017 -- the patient is ambulating a bit with the help of OT and PT and she is also eating much better. She has got inappropriate air mattress in place. 11/06/2017 -- the patient is a bit more ambivalent and continues to work with her mobilization. She is not yet ready for a wound VAC. 11/20/17 she is here in follow-up evaluation of a sacral ulcer. Wound VAC has not been improved, will continue with Santyl and anticipate that she can transfer to wound VAC at next appointment, bone fragments present in wound, no indication of infection. 12/19/17 on evaluation today patient sacral wound appears to be doing decently well. She still has some bone exposure  although this is minimal compared to previous in fact it just feels rough along the region of the bone or at least where this was this appears to have covered over with granulation for the most part. Nonetheless the wound measurements otherwise have not really significantly changed. We are going to be initiating the Wound VAC today. No fevers, chills, nausea, or vomiting noted at this time. 12/25/17-she is here in follow-up evaluation of the sacral stage IV pressure ulcer. She is tolerating negative pressure wound therapy. She does admit to prolonged sitting which we have discussed at length modified ways to offload and exercise. We will continue with negative pressure wound therapy and follow-up next week 01/08/18-she is here in follow-up evaluation for sacral stage IV pressure ulcer. She did not make last week's appointment secondary to  being hospitalized for electrolyte imbalance: she states the negative pressure wound therapy was maintained throughout hospitalization. She is unable to adequately offload per her admission which is primarily contributing to poor healing. She does have pressure relief chair cushions that she admits to using. We'll add Prisma, in conjunction with negative pressure. She is requesting a 2 week follow-up. 01/22/18-she is here in follow-up evaluation for sacral stage IV pressure ulcer. Essentially unchanged in measurements with persistent circumferential undermining. She continues to have limited mobility and suboptimal pressure relief. We will continue with Prisma and negative pressure wound therapy. If there continues to be narrowing of the external opening with no change [place], [place]. ([id number]) in the circumferential undermining we may need to abort the negative pressure wound therapy 02/05/18-she is here in follow-up evaluation for a sacral stage IV pressure ulcer. There continues to be essentially no change in measurements, we will discontinue negative pressure wound therapy, as there has been no improvement in 2 months. We will transition to [facility] Blue 3 times weekly. Home health continues to come out on a Monday Wednesday Friday schedule despite our orders for Tuesday Thursday Saturday. We have not been notified from the company regarding any inability to go on the days as ordered. We will again recheck the home health agency and if they are unable to provide care on a T/Th/Sat schedule the patient will have to reschedule her days here at the clinic to be more appropriately treated; I discussed the situation with her and she is understanding. She continues to have limited mobility and suboptimal pressure relief. Objective Constitutional Vitals Time Taken: 1:00 PM, Height: 64 in, Weight: 200 lbs, BMI: 34.3, Temperature: 97.6 F, Pulse: 66 bpm, Respiratory Rate: 16 breaths/min, Blood  Pressure: 212/71 mmHg. Integumentary (Hair, Skin) Wound #1 status is Open. Original cause of wound was Pressure Injury. The wound is located on the Sacrum. The wound measures 2cm length x 0.5cm width x 0.9cm depth; 0.785cm^2 area and 0.707cm^3 volume. There is bone, tendon, Fat Layer (Subcutaneous Tissue) Exposed, and fascia exposed. There is undermining starting at 12:00 and ending at 12:00 with a maximum distance of 2.8cm. There is a large amount of serous drainage noted. The wound margin is flat and intact. There is large (67-100%) red granulation within the wound bed. There is a small (1-33%) amount of necrotic tissue within the wound bed including Eschar and Adherent Slough. The periwound skin appearance exhibited: Scarring, Maceration. The periwound skin appearance did not exhibit: Callus, Crepitus, Excoriation, Induration, Rash, Dry/Scaly, Atrophie Blanche, Cyanosis, Ecchymosis, Hemosiderin Staining, Mottled, Pallor, Rubor, Erythema. Periwound temperature was noted as No Abnormality. The periwound has tenderness on palpation. Assessment Active Problems ICD-10 L89.154 - Pressure  ulcer of sacral region, stage 4 E44.0 - Moderate protein-calorie malnutrition Z99.3 - Dependence on wheelchair Procedures Wound #1 Pre-procedure diagnosis of Wound #1 is a Pressure Ulcer located on the Sacrum . There was a Skin/Subcutaneous Tissue Debridement ([id number]) debridement with total area of 1 sq cm performed by Lawanda Cousins, NP. with the following instrument(s): Curette to remove Viable and Non-Viable tissue/material including Exudate, Fibrin/Slough, and Subcutaneous Knittel, Sarye L. ([id number]) after achieving pain control using Lidocaine 4% Topical Solution. A time out was conducted at 13:34, prior to the start of the procedure. A Minimum amount of bleeding was controlled with Pressure. The procedure was tolerated well with a pain level of 0 throughout and a pain level of 0 following the  procedure. Post Debridement Measurements: 2cm length x 0.5cm width x 1cm depth; 0.785cm^3 volume. Post debridement Stage noted as Category/Stage IV. Character of Wound/Ulcer Post Debridement requires further debridement. Post procedure Diagnosis Wound #1: Same as Pre-Procedure Plan Wound Cleansing: Wound #1 Sacrum: Clean wound with Normal Saline. Anesthetic (add to Medication List): Wound #1 Sacrum: Topical Lidocaine 4% cream applied to wound bed prior to debridement (In Clinic Only). Skin Barriers/Peri-Wound Care: Wound #1 Sacrum: Skin Prep Primary Wound Dressing: Wound #1 Sacrum: Hydrafera Blue - (please pack lightly into the undermining as well) Secondary Dressing: Boardered Foam Dressing Dressing Change Frequency: Wound #1 Sacrum: Change Dressing Tuesday and Thursday. - HHRN to change on Tuesday, every other Thursday and pt will come to [place] every other Thursday. Follow-up Appointments: Wound #1 Sacrum: Return Appointment in 2 weeks. Off-Loading: Wound #1 Sacrum: Mattress - Continue air mattress Turn and reposition every 2 hours Additional Orders / Instructions: Wound #1 Sacrum: Increase protein intake. Activity as tolerated Other: - Please add vitamin A, vitamin C, multivitamin and zinc supplements to your diet - available over the counter Home Health: Wound #1 Sacrum: [place] Visits - HHRN to change on Tuesday, every other Thursday and pt will come to [place] every other Thursday. Home Health Nurse may visit PRN to address patient s wound care needs. FACE TO FACE ENCOUNTER: MEDICARE and MEDICAID PATIENTS: I certify that this patient is under my care and that I had a face-to-face encounter that meets the physician face-to-face encounter requirements with this patient on this date. The encounter with the patient was in whole or in part for the following MEDICAL CONDITION: (primary reason for [place]) MEDICAL NECESSITY: I  certify, that based on my findings, NURSING services are a medically necessary home health service. HOME BOUND STATUS: I certify that my clinical findings support that this patient is homebound (i.e., Due to illness or injury, pt requires aid of supportive devices such as crutches, cane, wheelchairs, walkers, the use of special transportation or the assistance of another person to leave their place of residence. There is a normal inability to leave the home and doing so requires considerable and taxing effort. Other absences are for medical reasons / religious services and are infrequent or of short duration when for other reasons). If current dressing causes regression in wound condition, may D/C ordered dressing product/s and apply Normal Saline Pryer, Darlean L. ([id number]) Moist Dressing daily until next [place] / Other MD appointment. [place] of regression in wound condition at [phone]. Please direct any NON-WOUND related issues/requests for orders to patient's Primary Care Physician Negative Pressure Wound Therapy: Wound #1 Sacrum: Discontinue NPWT. The following medication(s) was prescribed: lidocaine topical 4 % cream 1 1 cream  topical was prescribed at facility 1. d/c NPWT 2. hydrofera blue tiw T/Th/Sat 3. follow up in two weeks Electronic Signature(s) Signed: 02/06/2018 9:07:41 PM By: Lawanda Cousins Previous Signature: 02/05/2018 1:58:07 PM Version By: Lawanda Cousins Entered By: Lawanda Cousins on 02/06/2018 21:07:41 [place], [place] ([id number]) -------------------------------------------------------------------------------- SuperBill Details Patient Name: [name], Odeal L. Date of Service: 02/05/2018 Medical Record Number: [id number] Patient Account Number: [id number] Date of Birth/Sex: [birth year] (84 y.o. Female) Treating RN: Carolyne Fiscal, Debi Primary Care Provider: Priscille Kluver Other Clinician: Referring Provider: Priscille Kluver Treating  Provider/Extender: Cathie Olden in Treatment: 17 Diagnosis Coding ICD-10 Codes Code Description L89.154 Pressure ulcer of sacral region, stage 4 E44.0 Moderate protein-calorie malnutrition Z99.3 Dependence on wheelchair Facility Procedures CPT4 Code: [id number] Description: [place] TISSUE 20 SQ CM/< ICD-10 Diagnosis Description L89.154 Pressure ulcer of sacral region, stage 4 E44.0 Moderate protein-calorie malnutrition Modifier: Quantity: 1 Physician Procedures CPT4 Code: [id number] Description: 29191 - WC PHYS SUBQ TISS 20 SQ CM ICD-10 Diagnosis Description L89.154 Pressure ulcer of sacral region, stage 4 E44.0 Moderate protein-calorie malnutrition Modifier: Quantity: 1 Electronic Signature(s) Signed: 02/05/2018 1:58:18 PM By: Lawanda Cousins Entered By: Lawanda Cousins on 02/05/2018 13:58:17

## 2018-02-19 ENCOUNTER — Encounter: Payer: Medicare Other | Admitting: Nurse Practitioner

## 2018-02-19 DIAGNOSIS — L89154 Pressure ulcer of sacral region, stage 4: Secondary | ICD-10-CM | POA: Diagnosis not present

## 2018-03-12 ENCOUNTER — Ambulatory Visit: Payer: Medicare Other | Admitting: Nurse Practitioner

## 2018-04-01 DEATH — deceased
